# Patient Record
Sex: Female | Born: 1980 | Race: Black or African American | Hispanic: No | Marital: Single | Smoking: Current every day smoker
Health system: Southern US, Community
[De-identification: ages and names within clinical notes are randomized; demographics above are authoritative.]

## PROBLEM LIST (undated history)

## (undated) ENCOUNTER — Inpatient Hospital Stay: Payer: Self-pay

## (undated) DIAGNOSIS — Z86018 Personal history of other benign neoplasm: Secondary | ICD-10-CM

## (undated) DIAGNOSIS — F419 Anxiety disorder, unspecified: Secondary | ICD-10-CM

## (undated) DIAGNOSIS — Z9889 Other specified postprocedural states: Secondary | ICD-10-CM

## (undated) DIAGNOSIS — F32A Depression, unspecified: Secondary | ICD-10-CM

## (undated) DIAGNOSIS — F329 Major depressive disorder, single episode, unspecified: Secondary | ICD-10-CM

## (undated) DIAGNOSIS — D219 Benign neoplasm of connective and other soft tissue, unspecified: Secondary | ICD-10-CM

## (undated) DIAGNOSIS — O24419 Gestational diabetes mellitus in pregnancy, unspecified control: Secondary | ICD-10-CM

## (undated) HISTORY — PX: DILATION AND CURETTAGE OF UTERUS: SHX78

## (undated) HISTORY — DX: Personal history of other benign neoplasm: Z86.018

## (undated) HISTORY — DX: Depression, unspecified: F32.A

## (undated) HISTORY — DX: Benign neoplasm of connective and other soft tissue, unspecified: D21.9

## (undated) HISTORY — DX: Other specified postprocedural states: Z98.890

## (undated) HISTORY — DX: Major depressive disorder, single episode, unspecified: F32.9

## (undated) HISTORY — DX: Anxiety disorder, unspecified: F41.9

## (undated) HISTORY — DX: Gestational diabetes mellitus in pregnancy, unspecified control: O24.419

---

## 2004-10-02 ENCOUNTER — Emergency Department: Payer: Self-pay | Admitting: Emergency Medicine

## 2004-12-27 ENCOUNTER — Emergency Department: Payer: Self-pay | Admitting: Internal Medicine

## 2005-03-25 ENCOUNTER — Emergency Department: Payer: Self-pay | Admitting: Emergency Medicine

## 2005-03-26 ENCOUNTER — Emergency Department: Payer: Self-pay | Admitting: Internal Medicine

## 2005-07-18 ENCOUNTER — Emergency Department: Payer: Self-pay | Admitting: Emergency Medicine

## 2005-12-01 ENCOUNTER — Emergency Department: Payer: Self-pay | Admitting: Emergency Medicine

## 2006-11-27 ENCOUNTER — Emergency Department: Payer: Self-pay

## 2008-05-16 ENCOUNTER — Emergency Department: Payer: Self-pay | Admitting: Emergency Medicine

## 2008-10-02 ENCOUNTER — Emergency Department: Payer: Self-pay | Admitting: Emergency Medicine

## 2008-11-01 ENCOUNTER — Emergency Department: Payer: Self-pay | Admitting: Emergency Medicine

## 2009-09-19 ENCOUNTER — Emergency Department: Payer: Self-pay | Admitting: Emergency Medicine

## 2009-12-04 ENCOUNTER — Emergency Department: Payer: Self-pay | Admitting: Emergency Medicine

## 2010-04-30 ENCOUNTER — Emergency Department: Payer: Self-pay | Admitting: Emergency Medicine

## 2010-08-08 ENCOUNTER — Emergency Department: Payer: Self-pay | Admitting: Unknown Physician Specialty

## 2010-08-19 ENCOUNTER — Emergency Department: Payer: Self-pay | Admitting: Emergency Medicine

## 2010-08-27 ENCOUNTER — Emergency Department: Payer: Self-pay | Admitting: Emergency Medicine

## 2010-10-26 ENCOUNTER — Emergency Department: Payer: Self-pay | Admitting: Emergency Medicine

## 2012-02-08 ENCOUNTER — Emergency Department: Payer: Self-pay | Admitting: Emergency Medicine

## 2012-10-16 ENCOUNTER — Emergency Department: Payer: Self-pay | Admitting: Internal Medicine

## 2012-10-16 LAB — URINALYSIS, COMPLETE
Bilirubin,UR: NEGATIVE
Glucose,UR: NEGATIVE mg/dL (ref 0–75)
Nitrite: NEGATIVE
Ph: 5 (ref 4.5–8.0)
Protein: 30
RBC,UR: 314 /HPF (ref 0–5)
Specific Gravity: 1.029 (ref 1.003–1.030)
Squamous Epithelial: 5
WBC UR: 5 /HPF (ref 0–5)

## 2012-10-16 LAB — CBC
HCT: 40.8 % (ref 35.0–47.0)
HGB: 13.8 g/dL (ref 12.0–16.0)
MCH: 30.7 pg (ref 26.0–34.0)
MCHC: 33.9 g/dL (ref 32.0–36.0)
MCV: 91 fL (ref 80–100)
Platelet: 232 10*3/uL (ref 150–440)
RBC: 4.5 10*6/uL (ref 3.80–5.20)
RDW: 12.6 % (ref 11.5–14.5)
WBC: 7.3 10*3/uL (ref 3.6–11.0)

## 2012-10-16 LAB — HCG, QUANTITATIVE, PREGNANCY: Beta Hcg, Quant.: <1 m[IU]/mL — ABNORMAL LOW

## 2012-12-20 ENCOUNTER — Emergency Department: Payer: Self-pay | Admitting: Emergency Medicine

## 2012-12-25 ENCOUNTER — Emergency Department: Payer: Self-pay | Admitting: Emergency Medicine

## 2012-12-25 LAB — TROPONIN I: Troponin-I: <0.02 ng/mL

## 2012-12-25 LAB — BASIC METABOLIC PANEL
Anion Gap: 5 — ABNORMAL LOW (ref 7–16)
BUN: 17 mg/dL (ref 7–18)
Calcium, Total: 8.6 mg/dL (ref 8.5–10.1)
Chloride: 107 mmol/L (ref 98–107)
Co2: 25 mmol/L (ref 21–32)
Creatinine: 0.65 mg/dL (ref 0.60–1.30)
EGFR (African American): >60
EGFR (Non-African Amer.): >60
Glucose: 82 mg/dL (ref 65–99)
Osmolality: 274 (ref 275–301)
Potassium: 3.9 mmol/L (ref 3.5–5.1)
Sodium: 137 mmol/L (ref 136–145)

## 2012-12-25 LAB — CBC
HCT: 38.4 % (ref 35.0–47.0)
HGB: 13.1 g/dL (ref 12.0–16.0)
MCH: 31.1 pg (ref 26.0–34.0)
MCHC: 34.1 g/dL (ref 32.0–36.0)
MCV: 91 fL (ref 80–100)
Platelet: 184 10*3/uL (ref 150–440)
RBC: 4.21 10*6/uL (ref 3.80–5.20)
RDW: 12.7 % (ref 11.5–14.5)
WBC: 6.9 10*3/uL (ref 3.6–11.0)

## 2012-12-25 LAB — CK TOTAL AND CKMB (NOT AT ARMC)
CK, Total: 70 U/L (ref 21–215)
CK-MB: <0.5 ng/mL — ABNORMAL LOW (ref 0.5–3.6)

## 2013-04-12 ENCOUNTER — Emergency Department: Payer: Self-pay | Admitting: Emergency Medicine

## 2013-04-14 ENCOUNTER — Emergency Department: Payer: Self-pay | Admitting: Emergency Medicine

## 2013-04-17 ENCOUNTER — Emergency Department: Payer: Self-pay | Admitting: Emergency Medicine

## 2013-07-06 ENCOUNTER — Emergency Department: Payer: Self-pay | Admitting: Emergency Medicine

## 2013-07-06 LAB — URINALYSIS, COMPLETE
Bacteria: NONE SEEN
Bilirubin,UR: NEGATIVE
Blood: NEGATIVE
Glucose,UR: NEGATIVE mg/dL (ref 0–75)
Ketone: NEGATIVE
Leukocyte Esterase: NEGATIVE
Nitrite: NEGATIVE
Ph: 6 (ref 4.5–8.0)
Protein: NEGATIVE
RBC,UR: 1 /HPF (ref 0–5)
Specific Gravity: 1.021 (ref 1.003–1.030)
Squamous Epithelial: 4
WBC UR: 1 /HPF (ref 0–5)

## 2013-07-06 LAB — COMPREHENSIVE METABOLIC PANEL
Albumin: 3.6 g/dL (ref 3.4–5.0)
Alkaline Phosphatase: 83 U/L (ref 50–136)
Anion Gap: 3 — ABNORMAL LOW (ref 7–16)
BUN: 13 mg/dL (ref 7–18)
Bilirubin,Total: 0.9 mg/dL (ref 0.2–1.0)
Calcium, Total: 9.1 mg/dL (ref 8.5–10.1)
Chloride: 109 mmol/L — ABNORMAL HIGH (ref 98–107)
Co2: 28 mmol/L (ref 21–32)
Creatinine: 0.83 mg/dL (ref 0.60–1.30)
EGFR (African American): >60
EGFR (Non-African Amer.): >60
Glucose: 96 mg/dL (ref 65–99)
Osmolality: 279 (ref 275–301)
Potassium: 3.9 mmol/L (ref 3.5–5.1)
SGOT(AST): 24 U/L (ref 15–37)
SGPT (ALT): 21 U/L (ref 12–78)
Sodium: 140 mmol/L (ref 136–145)
Total Protein: 6.9 g/dL (ref 6.4–8.2)

## 2013-07-06 LAB — CBC
HCT: 39.2 % (ref 35.0–47.0)
HGB: 13.7 g/dL (ref 12.0–16.0)
MCH: 32 pg (ref 26.0–34.0)
MCHC: 35 g/dL (ref 32.0–36.0)
MCV: 91 fL (ref 80–100)
Platelet: 188 10*3/uL (ref 150–440)
RBC: 4.29 10*6/uL (ref 3.80–5.20)
RDW: 13 % (ref 11.5–14.5)
WBC: 8.4 10*3/uL (ref 3.6–11.0)

## 2013-07-06 LAB — LIPASE, BLOOD: Lipase: 150 U/L (ref 73–393)

## 2013-07-07 LAB — GC/CHLAMYDIA PROBE AMP

## 2013-07-07 LAB — WET PREP, GENITAL

## 2013-08-08 ENCOUNTER — Emergency Department: Payer: Self-pay | Admitting: Emergency Medicine

## 2013-08-08 LAB — COMPREHENSIVE METABOLIC PANEL
Albumin: 4.1 g/dL (ref 3.4–5.0)
Alkaline Phosphatase: 94 U/L (ref 50–136)
Anion Gap: 3 — ABNORMAL LOW (ref 7–16)
BUN: 12 mg/dL (ref 7–18)
Bilirubin,Total: 1 mg/dL (ref 0.2–1.0)
Calcium, Total: 10 mg/dL (ref 8.5–10.1)
Chloride: 103 mmol/L (ref 98–107)
Co2: 31 mmol/L (ref 21–32)
Creatinine: 0.86 mg/dL (ref 0.60–1.30)
EGFR (African American): >60
EGFR (Non-African Amer.): >60
Glucose: 93 mg/dL (ref 65–99)
Osmolality: 273 (ref 275–301)
Potassium: 3.6 mmol/L (ref 3.5–5.1)
SGOT(AST): 17 U/L (ref 15–37)
SGPT (ALT): 15 U/L (ref 12–78)
Sodium: 137 mmol/L (ref 136–145)
Total Protein: 7.9 g/dL (ref 6.4–8.2)

## 2013-08-08 LAB — CBC
HCT: 42.8 % (ref 35.0–47.0)
HGB: 14.6 g/dL (ref 12.0–16.0)
MCH: 30.7 pg (ref 26.0–34.0)
MCHC: 34 g/dL (ref 32.0–36.0)
MCV: 90 fL (ref 80–100)
Platelet: 219 10*3/uL (ref 150–440)
RBC: 4.75 10*6/uL (ref 3.80–5.20)
RDW: 12.6 % (ref 11.5–14.5)
WBC: 15.8 10*3/uL — ABNORMAL HIGH (ref 3.6–11.0)

## 2013-08-08 LAB — URINALYSIS, COMPLETE
Bacteria: NEGATIVE
Specific Gravity: 1.01 (ref 1.003–1.030)

## 2013-08-11 ENCOUNTER — Emergency Department: Payer: Self-pay | Admitting: Emergency Medicine

## 2013-08-11 LAB — URINALYSIS, COMPLETE
Bacteria: NONE SEEN
Bilirubin,UR: NEGATIVE
Blood: NEGATIVE
Glucose,UR: NEGATIVE mg/dL (ref 0–75)
Ketone: NEGATIVE
Leukocyte Esterase: NEGATIVE
Nitrite: NEGATIVE
Ph: 6 (ref 4.5–8.0)
Protein: NEGATIVE
RBC,UR: 1 /HPF (ref 0–5)
Specific Gravity: 1.013 (ref 1.003–1.030)
Squamous Epithelial: 12
WBC UR: <1 /HPF (ref 0–5)

## 2013-08-11 LAB — CBC
HCT: 40.5 % (ref 35.0–47.0)
HGB: 13.8 g/dL (ref 12.0–16.0)
MCH: 30.9 pg (ref 26.0–34.0)
MCHC: 34.1 g/dL (ref 32.0–36.0)
MCV: 91 fL (ref 80–100)
Platelet: 193 10*3/uL (ref 150–440)
RBC: 4.46 10*6/uL (ref 3.80–5.20)
RDW: 12.5 % (ref 11.5–14.5)
WBC: 10.7 10*3/uL (ref 3.6–11.0)

## 2013-08-11 LAB — COMPREHENSIVE METABOLIC PANEL
Albumin: 4 g/dL (ref 3.4–5.0)
Alkaline Phosphatase: 78 U/L
Anion Gap: 3 — ABNORMAL LOW (ref 7–16)
BUN: 15 mg/dL (ref 7–18)
Bilirubin,Total: 0.7 mg/dL (ref 0.2–1.0)
Calcium, Total: 9.7 mg/dL (ref 8.5–10.1)
Chloride: 105 mmol/L (ref 98–107)
Co2: 29 mmol/L (ref 21–32)
Creatinine: 0.8 mg/dL (ref 0.60–1.30)
EGFR (African American): >60
EGFR (Non-African Amer.): >60
Glucose: 88 mg/dL (ref 65–99)
Osmolality: 274 (ref 275–301)
Potassium: 3.5 mmol/L (ref 3.5–5.1)
SGOT(AST): 18 U/L (ref 15–37)
SGPT (ALT): 15 U/L (ref 12–78)
Sodium: 137 mmol/L (ref 136–145)
Total Protein: 7.6 g/dL (ref 6.4–8.2)

## 2013-08-11 LAB — URINE CULTURE

## 2013-09-21 HISTORY — PX: MYOMECTOMY: SHX85

## 2013-10-06 ENCOUNTER — Emergency Department: Payer: Self-pay | Admitting: Emergency Medicine

## 2013-10-09 ENCOUNTER — Emergency Department: Payer: Self-pay | Admitting: Emergency Medicine

## 2013-10-09 LAB — CBC WITH DIFFERENTIAL/PLATELET
Basophil #: 0.1 10*3/uL (ref 0.0–0.1)
Basophil %: 0.7 %
Eosinophil #: 0.3 10*3/uL (ref 0.0–0.7)
Eosinophil %: 2.2 %
HCT: 40.7 % (ref 35.0–47.0)
HGB: 13.6 g/dL (ref 12.0–16.0)
Lymphocyte #: 2.6 10*3/uL (ref 1.0–3.6)
Lymphocyte %: 22.7 %
MCH: 30.8 pg (ref 26.0–34.0)
MCHC: 33.3 g/dL (ref 32.0–36.0)
MCV: 92 fL (ref 80–100)
Monocyte #: 0.8 x10 3/mm (ref 0.2–0.9)
Monocyte %: 6.9 %
Neutrophil #: 7.8 10*3/uL — ABNORMAL HIGH (ref 1.4–6.5)
Neutrophil %: 67.5 %
Platelet: 202 10*3/uL (ref 150–440)
RBC: 4.41 10*6/uL (ref 3.80–5.20)
RDW: 13 % (ref 11.5–14.5)
WBC: 11.5 10*3/uL — ABNORMAL HIGH (ref 3.6–11.0)

## 2013-10-09 LAB — URINALYSIS, COMPLETE
Bacteria: NONE SEEN
Bilirubin,UR: NEGATIVE
Blood: NEGATIVE
Glucose,UR: NEGATIVE mg/dL (ref 0–75)
Hyaline Cast: 1
Ketone: NEGATIVE
Leukocyte Esterase: NEGATIVE
Nitrite: NEGATIVE
Ph: 5 (ref 4.5–8.0)
Protein: NEGATIVE
RBC,UR: <1 /HPF (ref 0–5)
Specific Gravity: 1.024 (ref 1.003–1.030)
Squamous Epithelial: 9
WBC UR: 1 /HPF (ref 0–5)

## 2013-10-09 LAB — COMPREHENSIVE METABOLIC PANEL
Albumin: 3.8 g/dL (ref 3.4–5.0)
Alkaline Phosphatase: 71 U/L
Anion Gap: 3 — ABNORMAL LOW (ref 7–16)
BUN: 12 mg/dL (ref 7–18)
Bilirubin,Total: 0.6 mg/dL (ref 0.2–1.0)
Calcium, Total: 9.4 mg/dL (ref 8.5–10.1)
Chloride: 107 mmol/L (ref 98–107)
Co2: 29 mmol/L (ref 21–32)
Creatinine: 0.66 mg/dL (ref 0.60–1.30)
EGFR (African American): >60
EGFR (Non-African Amer.): >60
Glucose: 89 mg/dL (ref 65–99)
Osmolality: 277 (ref 275–301)
Potassium: 3.6 mmol/L (ref 3.5–5.1)
SGOT(AST): 17 U/L (ref 15–37)
SGPT (ALT): 16 U/L (ref 12–78)
Sodium: 139 mmol/L (ref 136–145)
Total Protein: 7.3 g/dL (ref 6.4–8.2)

## 2013-10-30 ENCOUNTER — Emergency Department: Payer: Self-pay | Admitting: Emergency Medicine

## 2013-11-19 DIAGNOSIS — Z86018 Personal history of other benign neoplasm: Secondary | ICD-10-CM

## 2013-11-19 HISTORY — DX: Personal history of other benign neoplasm: Z86.018

## 2013-11-22 DIAGNOSIS — N979 Female infertility, unspecified: Secondary | ICD-10-CM | POA: Insufficient documentation

## 2014-02-08 ENCOUNTER — Emergency Department: Payer: Self-pay | Admitting: Emergency Medicine

## 2014-02-09 ENCOUNTER — Emergency Department: Payer: Self-pay | Admitting: Emergency Medicine

## 2014-02-09 LAB — CBC WITH DIFFERENTIAL/PLATELET
Basophil #: 0 10*3/uL (ref 0.0–0.1)
Basophil %: 0.5 %
Eosinophil #: 0.1 10*3/uL (ref 0.0–0.7)
Eosinophil %: 1.8 %
HCT: 44 % (ref 35.0–47.0)
HGB: 14.8 g/dL (ref 12.0–16.0)
Lymphocyte #: 1.9 10*3/uL (ref 1.0–3.6)
Lymphocyte %: 30.1 %
MCH: 31.3 pg (ref 26.0–34.0)
MCHC: 33.6 g/dL (ref 32.0–36.0)
MCV: 93 fL (ref 80–100)
Monocyte #: 0.4 x10 3/mm (ref 0.2–0.9)
Monocyte %: 6.3 %
Neutrophil #: 3.9 10*3/uL (ref 1.4–6.5)
Neutrophil %: 61.3 %
Platelet: 190 10*3/uL (ref 150–440)
RBC: 4.72 10*6/uL (ref 3.80–5.20)
RDW: 13.6 % (ref 11.5–14.5)
WBC: 6.3 10*3/uL (ref 3.6–11.0)

## 2014-02-09 LAB — BASIC METABOLIC PANEL
Anion Gap: 3 — ABNORMAL LOW (ref 7–16)
BUN: 9 mg/dL (ref 7–18)
Calcium, Total: 9.2 mg/dL (ref 8.5–10.1)
Chloride: 105 mmol/L (ref 98–107)
Co2: 29 mmol/L (ref 21–32)
Creatinine: 0.77 mg/dL (ref 0.60–1.30)
EGFR (African American): >60
EGFR (Non-African Amer.): >60
Glucose: 103 mg/dL — ABNORMAL HIGH (ref 65–99)
Osmolality: 273 (ref 275–301)
Potassium: 3.8 mmol/L (ref 3.5–5.1)
Sodium: 137 mmol/L (ref 136–145)

## 2014-02-09 LAB — URINALYSIS, COMPLETE
Bacteria: NONE SEEN
Bilirubin,UR: NEGATIVE
Blood: NEGATIVE
Glucose,UR: NEGATIVE mg/dL (ref 0–75)
Ketone: NEGATIVE
Leukocyte Esterase: NEGATIVE
Nitrite: NEGATIVE
Ph: 6 (ref 4.5–8.0)
Protein: NEGATIVE
RBC,UR: NONE SEEN /HPF (ref 0–5)
Specific Gravity: 1.019 (ref 1.003–1.030)
Squamous Epithelial: 5
WBC UR: <1 /HPF (ref 0–5)

## 2014-03-21 ENCOUNTER — Emergency Department: Payer: Self-pay | Admitting: Emergency Medicine

## 2014-05-29 ENCOUNTER — Emergency Department: Payer: Self-pay | Admitting: Student

## 2014-05-29 LAB — COMPREHENSIVE METABOLIC PANEL
Albumin: 3.5 g/dL (ref 3.4–5.0)
Alkaline Phosphatase: 75 U/L
Anion Gap: 5 — ABNORMAL LOW (ref 7–16)
BUN: 14 mg/dL (ref 7–18)
Bilirubin,Total: 0.4 mg/dL (ref 0.2–1.0)
Calcium, Total: 8.7 mg/dL (ref 8.5–10.1)
Chloride: 108 mmol/L — ABNORMAL HIGH (ref 98–107)
Co2: 27 mmol/L (ref 21–32)
Creatinine: 0.82 mg/dL (ref 0.60–1.30)
EGFR (African American): >60
EGFR (Non-African Amer.): >60
Glucose: 96 mg/dL (ref 65–99)
Osmolality: 280 (ref 275–301)
Potassium: 3.9 mmol/L (ref 3.5–5.1)
SGOT(AST): 20 U/L (ref 15–37)
SGPT (ALT): 13 U/L — ABNORMAL LOW
Sodium: 140 mmol/L (ref 136–145)
Total Protein: 6.9 g/dL (ref 6.4–8.2)

## 2014-05-29 LAB — URINALYSIS, COMPLETE
Bacteria: NONE SEEN
Bilirubin,UR: NEGATIVE
Glucose,UR: NEGATIVE mg/dL (ref 0–75)
Ketone: NEGATIVE
Leukocyte Esterase: NEGATIVE
Nitrite: NEGATIVE
Ph: 5 (ref 4.5–8.0)
Protein: NEGATIVE
RBC,UR: 1 /HPF (ref 0–5)
Specific Gravity: 1.02 (ref 1.003–1.030)
Squamous Epithelial: 1
WBC UR: <1 /HPF (ref 0–5)

## 2014-05-29 LAB — CBC
HCT: 40.7 % (ref 35.0–47.0)
HGB: 13.2 g/dL (ref 12.0–16.0)
MCH: 30.3 pg (ref 26.0–34.0)
MCHC: 32.4 g/dL (ref 32.0–36.0)
MCV: 93 fL (ref 80–100)
Platelet: 183 10*3/uL (ref 150–440)
RBC: 4.36 10*6/uL (ref 3.80–5.20)
RDW: 12.5 % (ref 11.5–14.5)
WBC: 6.4 10*3/uL (ref 3.6–11.0)

## 2014-05-29 LAB — LIPASE, BLOOD: Lipase: 137 U/L (ref 73–393)

## 2014-05-29 LAB — HCG, QUANTITATIVE, PREGNANCY: Beta Hcg, Quant.: <1 m[IU]/mL — ABNORMAL LOW

## 2014-08-15 ENCOUNTER — Emergency Department: Payer: Self-pay | Admitting: Emergency Medicine

## 2014-08-15 LAB — COMPREHENSIVE METABOLIC PANEL
Albumin: 4 g/dL (ref 3.4–5.0)
Alkaline Phosphatase: 67 U/L
Anion Gap: 2 — ABNORMAL LOW (ref 7–16)
BUN: 10 mg/dL (ref 7–18)
Bilirubin,Total: 1 mg/dL (ref 0.2–1.0)
Calcium, Total: 8.6 mg/dL (ref 8.5–10.1)
Chloride: 111 mmol/L — ABNORMAL HIGH (ref 98–107)
Co2: 28 mmol/L (ref 21–32)
Creatinine: 0.74 mg/dL (ref 0.60–1.30)
EGFR (African American): >60
EGFR (Non-African Amer.): >60
Glucose: 94 mg/dL (ref 65–99)
Osmolality: 280 (ref 275–301)
Potassium: 3.7 mmol/L (ref 3.5–5.1)
SGOT(AST): 10 U/L — ABNORMAL LOW (ref 15–37)
SGPT (ALT): 15 U/L
Sodium: 141 mmol/L (ref 136–145)
Total Protein: 7.8 g/dL (ref 6.4–8.2)

## 2014-08-15 LAB — URINALYSIS, COMPLETE
Bacteria: NONE SEEN
Bilirubin,UR: NEGATIVE
Blood: NEGATIVE
Glucose,UR: NEGATIVE mg/dL (ref 0–75)
Hyaline Cast: 14
Ketone: NEGATIVE
Leukocyte Esterase: NEGATIVE
Nitrite: NEGATIVE
Ph: 5 (ref 4.5–8.0)
Protein: 30
RBC,UR: 3 /HPF (ref 0–5)
Specific Gravity: 1.027 (ref 1.003–1.030)
Squamous Epithelial: 15
WBC UR: 3 /HPF (ref 0–5)

## 2014-08-15 LAB — CBC WITH DIFFERENTIAL/PLATELET
Basophil #: 0 10*3/uL (ref 0.0–0.1)
Basophil %: 0.5 %
Eosinophil #: 0.2 10*3/uL (ref 0.0–0.7)
Eosinophil %: 2.8 %
HCT: 43 % (ref 35.0–47.0)
HGB: 14.1 g/dL (ref 12.0–16.0)
Lymphocyte #: 2.8 10*3/uL (ref 1.0–3.6)
Lymphocyte %: 36 %
MCH: 30.4 pg (ref 26.0–34.0)
MCHC: 32.7 g/dL (ref 32.0–36.0)
MCV: 93 fL (ref 80–100)
Monocyte #: 0.6 x10 3/mm (ref 0.2–0.9)
Monocyte %: 7.9 %
Neutrophil #: 4.1 10*3/uL (ref 1.4–6.5)
Neutrophil %: 52.8 %
Platelet: 174 10*3/uL (ref 150–440)
RBC: 4.62 10*6/uL (ref 3.80–5.20)
RDW: 13.1 % (ref 11.5–14.5)
WBC: 7.7 10*3/uL (ref 3.6–11.0)

## 2015-01-30 ENCOUNTER — Emergency Department
Admission: EM | Admit: 2015-01-30 | Discharge: 2015-01-30 | Disposition: A | Discharge status: Home / Self Care | Payer: Self-pay | Attending: Emergency Medicine | Admitting: Emergency Medicine

## 2015-01-30 ENCOUNTER — Other Ambulatory Visit: Payer: Self-pay

## 2015-01-30 DIAGNOSIS — R42 Dizziness and giddiness: Secondary | ICD-10-CM | POA: Insufficient documentation

## 2015-01-30 DIAGNOSIS — Z720 Tobacco use: Secondary | ICD-10-CM | POA: Insufficient documentation

## 2015-01-30 DIAGNOSIS — R030 Elevated blood-pressure reading, without diagnosis of hypertension: Secondary | ICD-10-CM | POA: Insufficient documentation

## 2015-01-30 DIAGNOSIS — Z331 Pregnant state, incidental: Secondary | ICD-10-CM | POA: Insufficient documentation

## 2015-01-30 LAB — URINALYSIS COMPLETE WITH MICROSCOPIC (ARMC ONLY)
Bilirubin Urine: NEGATIVE
Glucose, UA: NEGATIVE mg/dL
Hgb urine dipstick: NEGATIVE
Leukocytes, UA: NEGATIVE
Nitrite: NEGATIVE
Protein, ur: 30 mg/dL — AB
RBC / HPF: NONE SEEN RBC/hpf (ref 0–5)
Specific Gravity, Urine: 1.031 — ABNORMAL HIGH (ref 1.005–1.030)
WBC, UA: NONE SEEN WBC/hpf (ref 0–5)
pH: 5 (ref 5.0–8.0)

## 2015-01-30 LAB — BASIC METABOLIC PANEL
Anion gap: 8 (ref 5–15)
BUN: 14 mg/dL (ref 6–20)
CO2: 27 mmol/L (ref 22–32)
Calcium: 9.3 mg/dL (ref 8.9–10.3)
Chloride: 103 mmol/L (ref 101–111)
Creatinine, Ser: 0.7 mg/dL (ref 0.44–1.00)
GFR calc Af Amer: >60 mL/min (ref >60)
GFR calc non Af Amer: >60 mL/min (ref >60)
Glucose, Bld: 158 mg/dL — ABNORMAL HIGH (ref 65–99)
Potassium: 2.6 mmol/L — CL (ref 3.5–5.1)
Sodium: 138 mmol/L (ref 135–145)

## 2015-01-30 LAB — CBC
HCT: 38.7 % (ref 35.0–47.0)
Hemoglobin: 12.8 g/dL (ref 12.0–16.0)
MCH: 30 pg (ref 26.0–34.0)
MCHC: 33.1 g/dL (ref 32.0–36.0)
MCV: 90.7 fL (ref 80.0–100.0)
Platelets: 174 10*3/uL (ref 150–440)
RBC: 4.27 MIL/uL (ref 3.80–5.20)
RDW: 12.7 % (ref 11.5–14.5)
WBC: 5.9 10*3/uL (ref 3.6–11.0)

## 2015-01-30 LAB — HCG, QUANTITATIVE, PREGNANCY: hCG, Beta Chain, Quant, S: <1 m[IU]/mL (ref <5)

## 2015-01-30 LAB — PREGNANCY, URINE: Preg Test, Ur: POSITIVE — AB

## 2015-01-30 MED ORDER — POTASSIUM CHLORIDE CRYS ER 20 MEQ PO TBCR
40.0000 meq | EXTENDED_RELEASE_TABLET | Freq: Once | ORAL | Status: AC
  Administered 2015-01-30: 40 meq via ORAL

## 2015-01-30 MED ORDER — POTASSIUM CHLORIDE CRYS ER 20 MEQ PO TBCR
EXTENDED_RELEASE_TABLET | ORAL | Status: AC
  Filled 2015-01-30: qty 2

## 2015-01-30 NOTE — Discharge Instructions (Signed)
Please eat more fruits and vegetables to raise your potassium. The low sodium V8 also has a lot of potassium in it.   Please follow up with your doctor this week to repeat the blood pressure reading again. If it remails elevated your doctor will likely start you on an antihypertensive medicine. (Your blood pressure was normal here after the first two readings)

## 2015-01-30 NOTE — ED Notes (Signed)
Pt reports this morning took her bp and it was high. Pt reports feeling of palpitations.  Denies chest pain.  Complains of some dizziness.
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
## 2015-01-31 LAB — MISC LABCORP TEST (SEND OUT): Labcorp test code: 480038

## 2015-03-23 ENCOUNTER — Emergency Department
Admission: EM | Admit: 2015-03-23 | Discharge: 2015-03-23 | Disposition: A | Discharge status: Home / Self Care | Payer: Self-pay | Attending: Emergency Medicine | Admitting: Emergency Medicine

## 2015-03-23 DIAGNOSIS — Y929 Unspecified place or not applicable: Secondary | ICD-10-CM | POA: Insufficient documentation

## 2015-03-23 DIAGNOSIS — Y998 Other external cause status: Secondary | ICD-10-CM | POA: Insufficient documentation

## 2015-03-23 DIAGNOSIS — Z720 Tobacco use: Secondary | ICD-10-CM | POA: Insufficient documentation

## 2015-03-23 DIAGNOSIS — Z791 Long term (current) use of non-steroidal anti-inflammatories (NSAID): Secondary | ICD-10-CM | POA: Insufficient documentation

## 2015-03-23 DIAGNOSIS — Z79899 Other long term (current) drug therapy: Secondary | ICD-10-CM | POA: Insufficient documentation

## 2015-03-23 DIAGNOSIS — S39012A Strain of muscle, fascia and tendon of lower back, initial encounter: Secondary | ICD-10-CM | POA: Insufficient documentation

## 2015-03-23 DIAGNOSIS — Y939 Activity, unspecified: Secondary | ICD-10-CM | POA: Insufficient documentation

## 2015-03-23 DIAGNOSIS — Z3202 Encounter for pregnancy test, result negative: Secondary | ICD-10-CM | POA: Insufficient documentation

## 2015-03-23 DIAGNOSIS — X58XXXA Exposure to other specified factors, initial encounter: Secondary | ICD-10-CM | POA: Insufficient documentation

## 2015-03-23 LAB — POCT PREGNANCY, URINE: Preg Test, Ur: NEGATIVE

## 2015-03-23 MED ORDER — NAPROXEN 500 MG PO TABS: 500.0000 mg | ORAL_TABLET | Freq: Two times a day (BID) | ORAL | Status: DC

## 2015-03-23 MED ORDER — METHOCARBAMOL 500 MG PO TABS: 500.0000 mg | ORAL_TABLET | Freq: Three times a day (TID) | ORAL | Status: DC
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
## 2015-03-23 NOTE — Discharge Instructions (Signed)
FOLLOW UP WITH YOUR DOCTOR IF ANY CONTINUED PROBLEMS  ICE OR HEAT TO MUSCLES AS NEEDED TAKE MEDICATION AS DIRECTED

## 2015-03-23 NOTE — ED Notes (Signed)
Pt c/o back pain since yesterday. No increased urination, denies fevers or chills.

## 2015-07-05 ENCOUNTER — Emergency Department
Admission: EM | Admit: 2015-07-05 | Discharge: 2015-07-05 | Discharge status: Against Medical Advice | Payer: Self-pay | Attending: Emergency Medicine | Admitting: Emergency Medicine

## 2015-07-05 ENCOUNTER — Emergency Department: Payer: Self-pay

## 2015-07-05 DIAGNOSIS — Z046 Encounter for general psychiatric examination, requested by authority: Secondary | ICD-10-CM | POA: Insufficient documentation

## 2015-07-05 DIAGNOSIS — Y9289 Other specified places as the place of occurrence of the external cause: Secondary | ICD-10-CM | POA: Insufficient documentation

## 2015-07-05 DIAGNOSIS — S00511A Abrasion of lip, initial encounter: Secondary | ICD-10-CM | POA: Insufficient documentation

## 2015-07-05 DIAGNOSIS — Y9389 Activity, other specified: Secondary | ICD-10-CM | POA: Insufficient documentation

## 2015-07-05 DIAGNOSIS — Z720 Tobacco use: Secondary | ICD-10-CM | POA: Insufficient documentation

## 2015-07-05 DIAGNOSIS — Y998 Other external cause status: Secondary | ICD-10-CM | POA: Insufficient documentation

## 2015-07-05 LAB — POCT PREGNANCY, URINE: Preg Test, Ur: NEGATIVE
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
## 2015-07-05 NOTE — ED Notes (Signed)
POC Urine pregnancy negative.
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
## 2015-07-10 ENCOUNTER — Emergency Department
Admission: EM | Admit: 2015-07-10 | Discharge: 2015-07-10 | Disposition: A | Discharge status: Home / Self Care | Payer: Self-pay | Attending: Emergency Medicine | Admitting: Emergency Medicine

## 2015-07-10 ENCOUNTER — Encounter: Payer: Self-pay | Admitting: Emergency Medicine

## 2015-07-10 DIAGNOSIS — H1132 Conjunctival hemorrhage, left eye: Secondary | ICD-10-CM | POA: Insufficient documentation

## 2015-07-10 DIAGNOSIS — Z720 Tobacco use: Secondary | ICD-10-CM | POA: Insufficient documentation

## 2015-07-10 DIAGNOSIS — S0083XD Contusion of other part of head, subsequent encounter: Secondary | ICD-10-CM

## 2015-07-10 DIAGNOSIS — S0512XD Contusion of eyeball and orbital tissues, left eye, subsequent encounter: Secondary | ICD-10-CM | POA: Insufficient documentation

## 2015-07-10 DIAGNOSIS — Z79899 Other long term (current) drug therapy: Secondary | ICD-10-CM | POA: Insufficient documentation

## 2015-07-10 MED ORDER — ONDANSETRON 4 MG PO TBDP
ORAL_TABLET | ORAL | Status: AC
  Filled 2015-07-10: qty 1

## 2015-07-10 MED ORDER — NAPROXEN 500 MG PO TABS: 500.0000 mg | ORAL_TABLET | Freq: Two times a day (BID) | ORAL | Status: DC
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
## 2015-07-10 NOTE — Discharge Instructions (Signed)
Subconjunctival Hemorrhage °Subconjunctival hemorrhage is bleeding that happens between the white part of your eye (sclera) and the clear membrane that covers the outside of your eye (conjunctiva). There are many tiny blood vessels near the surface of your eye. A subconjunctival hemorrhage happens when one or more of these vessels breaks and bleeds, causing a red patch to appear on your eye. This is similar to a bruise. °Depending on the amount of bleeding, the red patch may only cover a small area of your eye or it may cover the entire visible part of the sclera. If a lot of blood collects under the conjunctiva, there may also be swelling. Subconjunctival hemorrhages do not affect your vision or cause pain, but your eye may feel irritated if there is swelling. Subconjunctival hemorrhages usually do not require treatment, and they disappear on their own within two weeks. °CAUSES °This condition may be caused by: °· Mild trauma, such as rubbing your eye too hard. °· Severe trauma or blunt injuries. °· Coughing, sneezing, or vomiting. °· Straining, such as when lifting a heavy object. °· High blood pressure. °· Recent eye surgery. °· A history of diabetes. °· Certain medicines, especially blood thinners (anticoagulants). °· Other conditions, such as eye tumors, bleeding disorders, or blood vessel abnormalities. °Subconjunctival hemorrhages can happen without an obvious cause.  °SYMPTOMS  °Symptoms of this condition include: °· A bright red or dark red patch on the white part of the eye. °¨ The red area may spread out to cover a larger area of the eye before it goes away. °¨ The red area may turn brownish-yellow before it goes away. °· Swelling. °· Mild eye irritation. °DIAGNOSIS °This condition is diagnosed with a physical exam. If your subconjunctival hemorrhage was caused by trauma, your health care provider may refer you to an eye specialist (ophthalmologist) or another specialist to check for other injuries. You  may have other tests, including: °· An eye exam. °· A blood pressure check. °· Blood tests to check for bleeding disorders. °If your subconjunctival hemorrhage was caused by trauma, X-rays or a CT scan may be done to check for other injuries. °TREATMENT °Usually, no treatment is needed. Your health care provider may recommend eye drops or cold compresses to help with discomfort. °HOME CARE INSTRUCTIONS °· Take over-the-counter and prescription medicines only as directed by your health care provider. °· Use eye drops or cold compresses to help with discomfort as directed by your health care provider. °· Avoid activities, things, and environments that may irritate or injure your eye. °· Keep all follow-up visits as told by your health care provider. This is important. °SEEK MEDICAL CARE IF: °· You have pain in your eye. °· The bleeding does not go away within 3 weeks. °· You keep getting new subconjunctival hemorrhages. °SEEK IMMEDIATE MEDICAL CARE IF: °· Your vision changes or you have difficulty seeing. °· You suddenly develop severe sensitivity to light. °· You develop a severe headache, persistent vomiting, confusion, or abnormal tiredness (lethargy). °· Your eye seems to bulge or protrude from your eye socket. °· You develop unexplained bruises on your body. °· You have unexplained bleeding in another area of your body. °  °This information is not intended to replace advice given to you by your health care provider. Make sure you discuss any questions you have with your health care provider. °  °Document Released: 09/07/2005 Document Revised: 05/29/2015 Document Reviewed: 11/14/2014 °Elsevier Interactive Patient Education ©2016 [employer] ° °
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
## 2015-09-01 ENCOUNTER — Encounter: Payer: Self-pay | Admitting: Emergency Medicine

## 2015-09-01 ENCOUNTER — Emergency Department: Payer: Self-pay

## 2015-09-01 ENCOUNTER — Emergency Department
Admission: EM | Admit: 2015-09-01 | Discharge: 2015-09-01 | Disposition: A | Discharge status: Home / Self Care | Payer: Self-pay | Attending: Emergency Medicine | Admitting: Emergency Medicine

## 2015-09-01 DIAGNOSIS — Z3A15 15 weeks gestation of pregnancy: Secondary | ICD-10-CM | POA: Insufficient documentation

## 2015-09-01 DIAGNOSIS — F17210 Nicotine dependence, cigarettes, uncomplicated: Secondary | ICD-10-CM | POA: Insufficient documentation

## 2015-09-01 DIAGNOSIS — Z79899 Other long term (current) drug therapy: Secondary | ICD-10-CM | POA: Insufficient documentation

## 2015-09-01 DIAGNOSIS — O99512 Diseases of the respiratory system complicating pregnancy, second trimester: Secondary | ICD-10-CM | POA: Insufficient documentation

## 2015-09-01 DIAGNOSIS — O99332 Smoking (tobacco) complicating pregnancy, second trimester: Secondary | ICD-10-CM | POA: Insufficient documentation

## 2015-09-01 DIAGNOSIS — J069 Acute upper respiratory infection, unspecified: Secondary | ICD-10-CM | POA: Insufficient documentation

## 2015-09-01 MED ORDER — GUAIFENESIN-CODEINE 100-10 MG/5ML PO SOLN: 10.0000 mL | ORAL | Status: DC | PRN

## 2015-09-01 MED ORDER — AZITHROMYCIN 250 MG PO TABS: ORAL_TABLET | ORAL | Status: DC

## 2015-09-01 NOTE — ED Provider Notes (Signed)
[facility] Emergency Department Provider Note  ____________________________________________  Time seen: Approximately 2:32 PM  I have reviewed the triage vital signs and the nursing notes.   HISTORY  Chief Complaint Cough   HPI Vanessa Franco is a 34 y.o. female who presents for complaint of cough congestion 2 weeks. Denies any chest pain shortness of breath no runny nose or drainage. Denies any fever chills nausea vomiting. She is presently 15 weeks IUP.   History reviewed. No pertinent past medical history.  There are no active problems to display for this patient.   Past Surgical History  Procedure Laterality Date  . Myomectomy      Current Outpatient Rx  Name  Route  Sig  Dispense  Refill  . azithromycin (ZITHROMAX Z-PAK) 250 MG tablet      Take 2 tablets (500 mg) on  Day 1,  followed by 1 tablet (250 mg) once daily on Days 2 through 5.   6 each   0   . guaiFENesin-codeine 100-10 MG/5ML syrup   Oral   Take 10 mLs by mouth every 4 (four) hours as needed for cough.   180 mL   0   . Prenatal Vit-Fe Fumarate-FA (PRENATAL MULTIVITAMIN) TABS tablet   Oral   Take 1 tablet by mouth daily at 12 noon.           Allergies Toradol and Sulfa antibiotics  No family history on file.  Social History Social History  Substance Use Topics  . Smoking status: Current Every Day Smoker -- 1.00 packs/day    Types: Cigarettes  . Smokeless tobacco: None  . Alcohol Use: Yes     Comment: occ    Review of Systems Constitutional: No fever/chills Eyes: No visual changes. ENT: No sore throat. Cardiovascular: Denies chest pain. Respiratory: Denies shortness of breath. Gastrointestinal: No abdominal pain.  No nausea, no vomiting.  No diarrhea.  No constipation. Genitourinary: Negative for dysuria. Musculoskeletal: Negative for back pain. Skin: Negative for rash. Neurological: Negative for headaches, focal weakness or numbness.  10-point ROS  otherwise negative.  ____________________________________________   PHYSICAL EXAM:  VITAL SIGNS: ED Triage Vitals  Enc Vitals Group     BP 09/01/15 1407 121/87 mmHg     Pulse Rate 09/01/15 1407 96     Resp 09/01/15 1407 18     Temp 09/01/15 1407 99.1 F (37.3 C)     Temp Source 09/01/15 1407 Oral     SpO2 09/01/15 1407 100 %     Weight 09/01/15 1407 161 lb (73.029 kg)     Height 09/01/15 1407 5\' 7"  (1.702 m)     Head Cir --      Peak Flow --      Pain Score 09/01/15 1408 4     Pain Loc --      Pain Edu? --      Excl. in [place]? --     Constitutional: Alert and oriented. Well appearing and in no acute distress. Eyes: Conjunctivae are normal. PERRL. EOMI. Head: Atraumatic. Nose: No congestion/rhinnorhea. Mouth/Throat: Mucous membranes are moist.  Oropharynx non-erythematous. Neck: No stridor.   Cardiovascular: Normal rate, regular rhythm. Grossly normal heart sounds.  Good peripheral circulation. Respiratory: Normal respiratory effort.  No retractions. Lungs CTAB. Musculoskeletal: No lower extremity tenderness nor edema.  No joint effusions. Neurologic:  Normal speech and language. No gross focal neurologic deficits are appreciated. No gait instability. Skin:  Skin is warm, dry and intact. No rash noted. Psychiatric:  Mood and affect are normal. Speech and behavior are normal.  ____________________________________________   LABS (all labs ordered are listed, but only abnormal results are displayed)  Labs Reviewed - No data to display ____________________________________________   RADIOLOGY  Deferred secondary to pregnancy. Was treated for URI. ____________________________________________   PROCEDURES  Procedure(s) performed: None  Critical Care performed: No  ____________________________________________   INITIAL IMPRESSION / ASSESSMENT AND PLAN / ED COURSE  Pertinent labs & imaging results that were available during my care of the patient were reviewed by  me and considered in my medical decision making (see chart for details).  Acute URI. Rx given for Zithromax and guaifenesin with codeine. Patient follow-up with PCP or return the ER with any worsening symptomology. ____________________________________________   FINAL CLINICAL IMPRESSION(S) / ED DIAGNOSES  Final diagnoses:  Acute URI      Arlyss Repress, PA-C 09/01/15 1555  Nance Pear, MD 09/01/15 [phone]

## 2015-09-01 NOTE — ED Notes (Signed)
Discussed discharge instructions, prescriptions, and follow-up care with patient. No questions or concerns at this time. Pt stable at discharge.  

## 2015-09-01 NOTE — ED Notes (Signed)
C/o productive cough and congestion x 2 weeks

## 2015-09-01 NOTE — Discharge Instructions (Signed)
Upper Respiratory Infection, Adult Most upper respiratory infections (URIs) are a viral infection of the air passages leading to the lungs. A URI affects the nose, throat, and upper air passages. The most common type of URI is nasopharyngitis and is typically referred to as "the common cold." URIs run their course and usually go away on their own. Most of the time, a URI does not require medical attention, but sometimes a bacterial infection in the upper airways can follow a viral infection. This is called a secondary infection. Sinus and middle ear infections are common types of secondary upper respiratory infections. Bacterial pneumonia can also complicate a URI. A URI can worsen asthma and chronic obstructive pulmonary disease (COPD). Sometimes, these complications can require emergency medical care and may be life threatening.  CAUSES Almost all URIs are caused by viruses. A virus is a type of germ and can spread from one person to another.  RISKS FACTORS You may be at risk for a URI if:   You smoke.   You have chronic heart or lung disease.  You have a weakened defense (immune) system.   You are very young or very old.   You have nasal allergies or asthma.  You work in crowded or poorly ventilated areas.  You work in health care facilities or schools. SIGNS AND SYMPTOMS  Symptoms typically develop 2-3 days after you come in contact with a cold virus. Most viral URIs last 7-10 days. However, viral URIs from the influenza virus (flu virus) can last 14-18 days and are typically more severe. Symptoms may include:   Runny or stuffy (congested) nose.   Sneezing.   Cough.   Sore throat.   Headache.   Fatigue.   Fever.   Loss of appetite.   Pain in your forehead, behind your eyes, and over your cheekbones (sinus pain).  Muscle aches.  DIAGNOSIS  Your health care provider may diagnose a URI by:  Physical exam.  Tests to check that your symptoms are not due to  another condition such as:  Strep throat.  Sinusitis.  Pneumonia.  Asthma. TREATMENT  A URI goes away on its own with time. It cannot be cured with medicines, but medicines may be prescribed or recommended to relieve symptoms. Medicines may help:  Reduce your fever.  Reduce your cough.  Relieve nasal congestion. HOME CARE INSTRUCTIONS   Take medicines only as directed by your health care provider.   Gargle warm saltwater or take cough drops to comfort your throat as directed by your health care provider.  Use a warm mist humidifier or inhale steam from a shower to increase air moisture. This may make it easier to breathe.  Drink enough fluid to keep your urine clear or pale yellow.   Eat soups and other clear broths and maintain good nutrition.   Rest as needed.   Return to work when your temperature has returned to normal or as your health care provider advises. You may need to stay home longer to avoid infecting others. You can also use a face mask and careful hand washing to prevent spread of the virus.  Increase the usage of your inhaler if you have asthma.   Do not use any tobacco products, including cigarettes, chewing tobacco, or electronic cigarettes. If you need help quitting, ask your health care provider. PREVENTION  The best way to protect yourself from getting a cold is to practice good hygiene.   Avoid oral or hand contact with people with cold   symptoms.   Wash your hands often if contact occurs.  There is no clear evidence that vitamin C, vitamin E, echinacea, or exercise reduces the chance of developing a cold. However, it is always recommended to get plenty of rest, exercise, and practice good nutrition.  SEEK MEDICAL CARE IF:   You are getting worse rather than better.   Your symptoms are not controlled by medicine.   You have chills.  You have worsening shortness of breath.  You have brown or red mucus.  You have yellow or brown nasal  discharge.  You have pain in your face, especially when you bend forward.  You have a fever.  You have swollen neck glands.  You have pain while swallowing.  You have white areas in the back of your throat. SEEK IMMEDIATE MEDICAL CARE IF:   You have severe or persistent:  Headache.  Ear pain.  Sinus pain.  Chest pain.  You have chronic lung disease and any of the following:  Wheezing.  Prolonged cough.  Coughing up blood.  A change in your usual mucus.  You have a stiff neck.  You have changes in your:  Vision.  Hearing.  Thinking.  Mood. MAKE SURE YOU:   Understand these instructions.  Will watch your condition.  Will get help right away if you are not doing well or get worse.   This information is not intended to replace advice given to you by your health care provider. Make sure you discuss any questions you have with your health care provider.   Document Released: 03/03/2001 Document Revised: 01/22/2015 Document Reviewed: 12/13/2013 Elsevier Interactive Patient Education 2016 Elsevier Inc[employer]  

## 2015-09-22 NOTE — L&D Delivery Note (Signed)
Delivery Note At 1:15 PM a viable female was delivered via Vaginal, Spontaneous Delivery (Presentation: LOA).  APGAR: 8, 9; weight pending as baby is skin-to-skin with mom .   Placenta status: delivered spontaneously, intact.  Cord: 3VC with the following complications: None.  Cord pH: n/a  Anesthesia:  epidural Episiotomy: None Lacerations: 1st degree;Vaginal;Sulcus Suture Repair: 3.0 vicryl Est. Blood Loss (mL): 350  Mom to postpartum.  Baby to Couplet care / Skin to Skin.  Called to see patient.  Mom pushed to deliver a viable female infant.  The head followed by shoulders, which delivered without difficulty, and the rest of the body.  No nuchal cord noted.  Baby to mom's chest.  Cord clamped and cut after > 1 min delay.  Cord blood obtained.  Placenta delivered spontaneously, intact, with a 3-vessel cord.  First degree perineal vaginal left sulcal laceration repaired with 3-0 Vicryl in standard fashion.  All counts correct.  Hemostasis obtained with IV pitocin and fundal massage. EBL 350 mL.     Will Bonnet, MD 08/16/2016, 1:32 PM

## 2015-12-02 ENCOUNTER — Emergency Department
Admission: EM | Admit: 2015-12-02 | Discharge: 2015-12-02 | Disposition: A | Discharge status: Home / Self Care | Payer: Self-pay | Attending: Emergency Medicine | Admitting: Emergency Medicine

## 2015-12-02 ENCOUNTER — Encounter: Payer: Self-pay | Admitting: Emergency Medicine

## 2015-12-02 DIAGNOSIS — H109 Unspecified conjunctivitis: Secondary | ICD-10-CM | POA: Insufficient documentation

## 2015-12-02 DIAGNOSIS — F17210 Nicotine dependence, cigarettes, uncomplicated: Secondary | ICD-10-CM | POA: Insufficient documentation

## 2015-12-02 DIAGNOSIS — Z79899 Other long term (current) drug therapy: Secondary | ICD-10-CM | POA: Insufficient documentation

## 2015-12-02 MED ORDER — POLYMYXIN B-TRIMETHOPRIM 10000-0.1 UNIT/ML-% OP SOLN: 2.0000 [drp] | Freq: Four times a day (QID) | OPHTHALMIC | Status: DC

## 2015-12-02 NOTE — Discharge Instructions (Signed)
Bacterial Conjunctivitis °Bacterial conjunctivitis (commonly called pink eye) is redness, soreness, or puffiness (inflammation) of the white part of your eye. It is caused by a germ called bacteria. These germs can easily spread from person to person (contagious). Your eye often will become red or pink. Your eye may also become irritated, watery, or have a thick discharge.  °HOME CARE  °1. Apply a cool, clean washcloth over closed eyelids. Do this for 10-20 minutes, 3-4 times a day while you have pain. °2. Gently wipe away any fluid coming from the eye with a warm, wet washcloth or cotton ball. °3. Wash your hands often with soap and water. Use paper towels to dry your hands. °4. Do not share towels or washcloths. °5. Change or wash your pillowcase every day. °6. Do not use eye makeup until the infection is gone. °7. Do not use machines or drive if your vision is blurry. °8. Stop using contact lenses. Do not use them again until your doctor says it is okay. °9. Do not touch the tip of the eye drop bottle or medicine tube with your fingers when you put medicine on the eye. °GET HELP RIGHT AWAY IF:  °1. Your eye is not better after 3 days of starting your medicine. °2. You have a yellowish fluid coming out of the eye. °3. You have more pain in the eye. °4. Your eye redness is spreading. °5. Your vision becomes blurry. °6. You have a fever or lasting symptoms for more than 2-3 days. °7. You have a fever and your symptoms suddenly get worse. °8. You have pain in the face. °9. Your face gets red or puffy (swollen). °MAKE SURE YOU:  °· Understand these instructions. °· Will watch this condition. °· Will get help right away if you are not doing well or get worse. °  °This information is not intended to replace advice given to you by your health care provider. Make sure you discuss any questions you have with your health care provider. °  °Document Released: 06/16/2008 Document Revised: 08/24/2012 Document Reviewed:  05/13/2012 °Elsevier Interactive Patient Education ©2016 Elsevier Inc[employer] ° °How to Use Eye Drops and Eye Ointments °HOW TO APPLY EYE DROPS °Follow these steps when applying eye drops: °10. Wash your hands. °11. Tilt your head back. °12. Put a finger under your eye and use it to gently pull your lower lid downward. Keep that finger in place. °13. Using your other hand, hold the dropper between your thumb and index finger. °14. Position the dropper just over the edge of the lower lid. Hold it as close to your eye as you can without touching the dropper to your eye. °15. Steady your hand. One way to do this is to lean your index finger against your brow. °16. Look up. °17. Slowly and gently squeeze one drop of medicine into your eye. °18. Close your eye. °19. Place a finger between your lower eyelid and your nose. Press gently for 2 minutes. This increases the amount of time that the medicine is exposed to the eye. It also reduces side effects that can develop if the drop gets into the bloodstream through the nose. °HOW TO APPLY EYE OINTMENTS °Follow these steps when applying eye ointments: °10. Wash your hands. °11. Put a finger under your eye and use it to gently pull your lower lid downward. Keep that finger in place. °12. Using your other hand, place the tip of the tube between your thumb and index finger with   the remaining fingers braced against your cheek or nose. °13. Hold the tube just over the edge of your lower lid without touching the tube to your lid or eyeball. °14. Look up. °15. Line the inner part of your lower lid with ointment. °16. Gently pull up on your upper lid and look down. This will force the ointment to spread over the surface of the eye. °17. Release the upper lid. °18. If you can, close your eyes for 1-2 minutes. °Do not rub your eyes. If you applied the ointment correctly, your vision will be blurry for a few minutes. This is normal. °ADDITIONAL INFORMATION °· Make sure to use the eye drops or  ointment as told by your health care provider. °· If you have been told to use both eye drops and an eye ointment, apply the eye drops first, then wait 3-4 minutes before you apply the ointment. °· Try not to touch the tip of the dropper or tube to your eye. A dropper or tube that has touched the eye can become contaminated. °  °This information is not intended to replace advice given to you by your health care provider. Make sure you discuss any questions you have with your health care provider. °  °Document Released: 12/14/2000 Document Revised: 01/22/2015 Document Reviewed: 09/03/2014 °Elsevier Interactive Patient Education ©2016 [employer]vier Inc[employer] ° °

## 2015-12-02 NOTE — ED Notes (Signed)
Pt presents with left eye pain started yesterday. Redness noted to left eye.

## 2015-12-02 NOTE — ED Notes (Signed)
Left eye red 2 days ago  Woke up this am with increased redness with some drainage this am  Denies any injury

## 2015-12-02 NOTE — ED Provider Notes (Signed)
[facility] Emergency Department Provider Note  ____________________________________________  Time seen: Approximately 10:56 AM  I have reviewed the triage vital signs and the nursing notes.   HISTORY  Chief Complaint Eye Pain    HPI Vanessa Franco is a 35 y.o. female, NAD, presents to the emergency department with 2 days of left red eye. Woke this morning and had some drainage. Has not had any swelling. Does note some irritation but no overt pain to the left eye. Has had no injury or trauma to the eyes. Did have allergy type symptoms approximately 2 weeks ago. Does not currently have any upper respiratory symptoms, nasal congestion, headache.   History reviewed. No pertinent past medical history.  There are no active problems to display for this patient.   Past Surgical History  Procedure Laterality Date  . Myomectomy      Current Outpatient Rx  Name  Route  Sig  Dispense  Refill  . Prenatal Vit-Fe Fumarate-FA (PRENATAL MULTIVITAMIN) TABS tablet   Oral   Take 1 tablet by mouth daily at 12 noon.         . trimethoprim-polymyxin b (POLYTRIM) ophthalmic solution   Left Eye   Place 2 drops into the left eye every 6 (six) hours.   10 mL   0     Allergies Toradol and Sulfa antibiotics  No family history on file.  Social History Social History  Substance Use Topics  . Smoking status: Current Every Day Smoker -- 1.00 packs/day    Types: Cigarettes  . Smokeless tobacco: None  . Alcohol Use: Yes     Comment: occ     Review of Systems  Constitutional: No fever/chills Eyes: Positive discharge and redness to left eye. No visual changes.  ENT: Positive sneezing that has resolved. No sore throat or nasal congestion, runny nose, ear pain. Cardiovascular: No chest pain. Respiratory: No cough. No shortness of breath. No wheezing.  Musculoskeletal: Negative for general myalgias.  Skin: Negative for rash. Neurological: Negative for  headaches, focal weakness or numbness. 10-point ROS otherwise negative.  ____________________________________________   PHYSICAL EXAM:  VITAL SIGNS: ED Triage Vitals  Enc Vitals Group     BP 12/02/15 1015 133/89 mmHg     Pulse --      Resp 12/02/15 1015 20     Temp 12/02/15 1015 98.6 F (37 C)     Temp Source 12/02/15 1015 Oral     SpO2 12/02/15 1015 97 %     Weight 12/02/15 1015 160 lb (72.576 kg)     Height 12/02/15 1015 5\' 7"  (1.702 m)     Head Cir --      Peak Flow --      Pain Score 12/02/15 1021 6     Pain Loc --      Pain Edu? --      Excl. in [place]? --     Constitutional: Alert and oriented. Well appearing and in no acute distress. Eyes: Left conjunctivae with mild erythema. Clear drainage noted. Right eye without redness, swelling, discharge. PERRL. EOMI without pain.  Head: Atraumatic. ENT:      Ears: TMs visualized bilaterally without effusion, bulging, perforations.      Nose: No congestion/rhinnorhea.      Mouth/Throat: Mucous membranes are moist.  Neck: Supple with full range of motion Hematological/Lymphatic/Immunilogical: No cervical lymphadenopathy. Cardiovascular:  Good peripheral circulation. Respiratory: Normal respiratory effort without tachypnea or retractions.  Neurologic:  Normal speech and language. No gross  focal neurologic deficits are appreciated.  Skin:  Skin is warm, dry and intact. No rash noted. Psychiatric: Mood and affect are normal. Speech and behavior are normal. Patient exhibits appropriate insight and judgement.   ____________________________________________   LABS  None  ____________________________________________  EKG  None ____________________________________________  RADIOLOGY  None  ____________________________________________    PROCEDURES  Procedure(s) performed: None    Medications - No data to display   ____________________________________________   INITIAL IMPRESSION / ASSESSMENT AND PLAN / ED  COURSE  Patient's diagnosis is consistent with left conjunctivitis. Patient will be discharged home with prescriptions for Polytrim eyedrops to use as directed. Patient is to follow up with [facility] if symptoms persist past this treatment course. Patient is given ED precautions to return to the ED for any worsening or new symptoms.    ____________________________________________  FINAL CLINICAL IMPRESSION(S) / ED DIAGNOSES  Final diagnoses:  Conjunctivitis of left eye      NEW MEDICATIONS STARTED DURING THIS VISIT:  New Prescriptions   TRIMETHOPRIM-POLYMYXIN B (POLYTRIM) OPHTHALMIC SOLUTION    Place 2 drops into the left eye every 6 (six) hours.         Braxton Feathers, PA-C 12/02/15 1112  Lavonia Drafts, MD 12/02/15 1213

## 2015-12-20 ENCOUNTER — Encounter: Payer: Self-pay | Admitting: Emergency Medicine

## 2015-12-20 ENCOUNTER — Emergency Department
Admission: EM | Admit: 2015-12-20 | Discharge: 2015-12-20 | Disposition: A | Discharge status: Home / Self Care | Payer: Self-pay | Attending: Emergency Medicine | Admitting: Emergency Medicine

## 2015-12-20 ENCOUNTER — Emergency Department: Payer: Self-pay

## 2015-12-20 DIAGNOSIS — B3731 Acute candidiasis of vulva and vagina: Secondary | ICD-10-CM

## 2015-12-20 DIAGNOSIS — O26891 Other specified pregnancy related conditions, first trimester: Secondary | ICD-10-CM | POA: Insufficient documentation

## 2015-12-20 DIAGNOSIS — F17210 Nicotine dependence, cigarettes, uncomplicated: Secondary | ICD-10-CM | POA: Insufficient documentation

## 2015-12-20 DIAGNOSIS — Z9889 Other specified postprocedural states: Secondary | ICD-10-CM | POA: Insufficient documentation

## 2015-12-20 DIAGNOSIS — B373 Candidiasis of vulva and vagina: Secondary | ICD-10-CM | POA: Insufficient documentation

## 2015-12-20 DIAGNOSIS — Z3490 Encounter for supervision of normal pregnancy, unspecified, unspecified trimester: Secondary | ICD-10-CM

## 2015-12-20 DIAGNOSIS — Z3A01 Less than 8 weeks gestation of pregnancy: Secondary | ICD-10-CM | POA: Insufficient documentation

## 2015-12-20 LAB — COMPREHENSIVE METABOLIC PANEL
ALT: 10 U/L — ABNORMAL LOW (ref 14–54)
AST: 14 U/L — ABNORMAL LOW (ref 15–41)
Albumin: 4.1 g/dL (ref 3.5–5.0)
Alkaline Phosphatase: 57 U/L (ref 38–126)
Anion gap: 4 — ABNORMAL LOW (ref 5–15)
BUN: 9 mg/dL (ref 6–20)
CO2: 23 mmol/L (ref 22–32)
Calcium: 9.1 mg/dL (ref 8.9–10.3)
Chloride: 107 mmol/L (ref 101–111)
Creatinine, Ser: 0.62 mg/dL (ref 0.44–1.00)
GFR calc Af Amer: >60 mL/min (ref >60)
GFR calc non Af Amer: >60 mL/min (ref >60)
Glucose, Bld: 100 mg/dL — ABNORMAL HIGH (ref 65–99)
Potassium: 3.8 mmol/L (ref 3.5–5.1)
Sodium: 134 mmol/L — ABNORMAL LOW (ref 135–145)
Total Bilirubin: 0.5 mg/dL (ref 0.3–1.2)
Total Protein: 7 g/dL (ref 6.5–8.1)

## 2015-12-20 LAB — URINALYSIS COMPLETE WITH MICROSCOPIC (ARMC ONLY)
Bilirubin Urine: NEGATIVE
Glucose, UA: NEGATIVE mg/dL
Ketones, ur: NEGATIVE mg/dL
Nitrite: NEGATIVE
Protein, ur: NEGATIVE mg/dL
Specific Gravity, Urine: 1.023 (ref 1.005–1.030)
pH: 5 (ref 5.0–8.0)

## 2015-12-20 LAB — CBC
HCT: 37.4 % (ref 35.0–47.0)
Hemoglobin: 12.6 g/dL (ref 12.0–16.0)
MCH: 30.3 pg (ref 26.0–34.0)
MCHC: 33.8 g/dL (ref 32.0–36.0)
MCV: 89.6 fL (ref 80.0–100.0)
Platelets: 169 10*3/uL (ref 150–440)
RBC: 4.17 MIL/uL (ref 3.80–5.20)
RDW: 13.2 % (ref 11.5–14.5)
WBC: 6.6 10*3/uL (ref 3.6–11.0)

## 2015-12-20 LAB — CHLAMYDIA/NGC RT PCR (ARMC ONLY)
Chlamydia Tr: NOT DETECTED
N gonorrhoeae: NOT DETECTED

## 2015-12-20 LAB — WET PREP, GENITAL
Sperm: NONE SEEN
Trich, Wet Prep: NONE SEEN

## 2015-12-20 LAB — HCG, QUANTITATIVE, PREGNANCY: hCG, Beta Chain, Quant, S: 5997 m[IU]/mL — ABNORMAL HIGH (ref <5)

## 2015-12-20 LAB — POCT PREGNANCY, URINE: Preg Test, Ur: POSITIVE — AB

## 2015-12-20 MED ORDER — MICONAZOLE NITRATE 2 % VA CREA: 1.0000 | TOPICAL_CREAM | Freq: Every day | VAGINAL | Status: AC

## 2015-12-20 NOTE — Discharge Instructions (Signed)
First Trimester of Pregnancy The first trimester of pregnancy is from week 1 until the end of week 12 (months 1 through 3). A week after a sperm fertilizes an egg, the egg will implant on the wall of the uterus. This embryo will begin to develop into a baby. Genes from you and your partner are forming the baby. The female genes determine whether the baby is a boy or a girl. At 6-8 weeks, the eyes and face are formed, and the heartbeat can be seen on ultrasound. At the end of 12 weeks, all the baby's organs are formed.  Now that you are pregnant, you will want to do everything you can to have a healthy baby. Two of the most important things are to get good prenatal care and to follow your health care provider's instructions. Prenatal care is all the medical care you receive before the baby's birth. This care will help prevent, find, and treat any problems during the pregnancy and childbirth. BODY CHANGES Your body goes through many changes during pregnancy. The changes vary from woman to woman.   You may gain or lose a couple of pounds at first.  You may feel sick to your stomach (nauseous) and throw up (vomit). If the vomiting is uncontrollable, call your health care provider.  You may tire easily.  You may develop headaches that can be relieved by medicines approved by your health care provider.  You may urinate more often. Painful urination may mean you have a bladder infection.  You may develop heartburn as a result of your pregnancy.  You may develop constipation because certain hormones are causing the muscles that push waste through your intestines to slow down.  You may develop hemorrhoids or swollen, bulging veins (varicose veins).  Your breasts may begin to grow larger and become tender. Your nipples may stick out more, and the tissue that surrounds them (areola) may become darker.  Your gums may bleed and may be sensitive to brushing and flossing.  Dark spots or blotches (chloasma,  mask of pregnancy) may develop on your face. This will likely fade after the baby is born.  Your menstrual periods will stop.  You may have a loss of appetite.  You may develop cravings for certain kinds of food.  You may have changes in your emotions from day to day, such as being excited to be pregnant or being concerned that something may go wrong with the pregnancy and baby.  You may have more vivid and strange dreams.  You may have changes in your hair. These can include thickening of your hair, rapid growth, and changes in texture. Some women also have hair loss during or after pregnancy, or hair that feels dry or thin. Your hair will most likely return to normal after your baby is born. WHAT TO EXPECT AT YOUR PRENATAL VISITS During a routine prenatal visit:  You will be weighed to make sure you and the baby are growing normally.  Your blood pressure will be taken.  Your abdomen will be measured to track your baby's growth.  The fetal heartbeat will be listened to starting around week 10 or 12 of your pregnancy.  Test results from any previous visits will be discussed. Your health care provider may ask you:  How you are feeling.  If you are feeling the baby move.  If you have had any abnormal symptoms, such as leaking fluid, bleeding, severe headaches, or abdominal cramping.  If you are using any tobacco products,   including cigarettes, chewing tobacco, and electronic cigarettes.  If you have any questions. Other tests that may be performed during your first trimester include:  Blood tests to find your blood type and to check for the presence of any previous infections. They will also be used to check for low iron levels (anemia) and Rh antibodies. Later in the pregnancy, blood tests for diabetes will be done along with other tests if problems develop.  Urine tests to check for infections, diabetes, or protein in the urine.  An ultrasound to confirm the proper growth  and development of the baby.  An amniocentesis to check for possible genetic problems.  Fetal screens for spina bifida and Down syndrome.  You may need other tests to make sure you and the baby are doing well.  HIV (human immunodeficiency virus) testing. Routine prenatal testing includes screening for HIV, unless you choose not to have this test. HOME CARE INSTRUCTIONS  Medicines  Follow your health care provider's instructions regarding medicine use. Specific medicines may be either safe or unsafe to take during pregnancy.  Take your prenatal vitamins as directed.  If you develop constipation, try taking a stool softener if your health care provider approves. Diet  Eat regular, well-balanced meals. Choose a variety of foods, such as meat or vegetable-based protein, fish, milk and low-fat dairy products, vegetables, fruits, and whole grain breads and cereals. Your health care provider will help you determine the amount of weight gain that is right for you.  Avoid raw meat and uncooked cheese. These carry germs that can cause birth defects in the baby.  Eating four or five small meals rather than three large meals a day may help relieve nausea and vomiting. If you start to feel nauseous, eating a few soda crackers can be helpful. Drinking liquids between meals instead of during meals also seems to help nausea and vomiting.  If you develop constipation, eat more high-fiber foods, such as fresh vegetables or fruit and whole grains. Drink enough fluids to keep your urine clear or pale yellow. Activity and Exercise  Exercise only as directed by your health care provider. Exercising will help you:  Control your weight.  Stay in shape.  Be prepared for labor and delivery.  Experiencing pain or cramping in the lower abdomen or low back is a good sign that you should stop exercising. Check with your health care provider before continuing normal exercises.  Try to avoid standing for long  periods of time. Move your legs often if you must stand in one place for a long time.  Avoid heavy lifting.  Wear low-heeled shoes, and practice good posture.  You may continue to have sex unless your health care provider directs you otherwise. Relief of Pain or Discomfort  Wear a good support bra for breast tenderness.   Take warm sitz baths to soothe any pain or discomfort caused by hemorrhoids. Use hemorrhoid cream if your health care provider approves.   Rest with your legs elevated if you have leg cramps or low back pain.  If you develop varicose veins in your legs, wear support hose. Elevate your feet for 15 minutes, 3-4 times a day. Limit salt in your diet. Prenatal Care  Schedule your prenatal visits by the twelfth week of pregnancy. They are usually scheduled monthly at first, then more often in the last 2 months before delivery.  Write down your questions. Take them to your prenatal visits.  Keep all your prenatal visits as directed by your   health care provider. Safety  Wear your seat belt at all times when driving.  Make a list of emergency phone numbers, including numbers for family, friends, the hospital, and police and fire departments. General Tips  Ask your health care provider for a referral to a local prenatal education class. Begin classes no later than at the beginning of month 6 of your pregnancy.  Ask for help if you have counseling or nutritional needs during pregnancy. Your health care provider can offer advice or refer you to specialists for help with various needs.  Do not use hot tubs, steam rooms, or saunas.  Do not douche or use tampons or scented sanitary pads.  Do not cross your legs for long periods of time.  Avoid cat litter boxes and soil used by cats. These carry germs that can cause birth defects in the baby and possibly loss of the fetus by miscarriage or stillbirth.  Avoid all smoking, herbs, alcohol, and medicines not prescribed by  your health care provider. Chemicals in these affect the formation and growth of the baby.  Do not use any tobacco products, including cigarettes, chewing tobacco, and electronic cigarettes. If you need help quitting, ask your health care provider. You may receive counseling support and other resources to help you quit.  Schedule a dentist appointment. At home, brush your teeth with a soft toothbrush and be gentle when you floss. SEEK MEDICAL CARE IF:   You have dizziness.  You have mild pelvic cramps, pelvic pressure, or nagging pain in the abdominal area.  You have persistent nausea, vomiting, or diarrhea.  You have a bad smelling vaginal discharge.  You have pain with urination.  You notice increased swelling in your face, hands, legs, or ankles. SEEK IMMEDIATE MEDICAL CARE IF:   You have a fever.  You are leaking fluid from your vagina.  You have spotting or bleeding from your vagina.  You have severe abdominal cramping or pain.  You have rapid weight gain or loss.  You vomit blood or material that looks like coffee grounds.  You are exposed to German measles and have never had them.  You are exposed to fifth disease or chickenpox.  You develop a severe headache.  You have shortness of breath.  You have any kind of trauma, such as from a fall or a car accident.   This information is not intended to replace advice given to you by your health care provider. Make sure you discuss any questions you have with your health care provider.   Document Released: 09/01/2001 Document Revised: 09/28/2014 Document Reviewed: 07/18/2013 Elsevier Interactive Patient Education 2016 [employer]  

## 2015-12-20 NOTE — ED Provider Notes (Signed)
[facility] Emergency Department Provider Note  ____________________________________________    I have reviewed the triage vital signs and the nursing notes.   HISTORY  Chief Complaint Abdominal Pain    HPI Vanessa Franco is a 35 y.o. female who reports her last menstrual cycle Was at the end of February. She has had positive pregnancy test. She has had mild cramping in the left lower abdomen/pelvis. She is also noticed some white discharge. 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 She denies nausea or vomiting. She has not seen her OB yet.   History reviewed. No pertinent past medical history.  There are no active problems to display for this patient.   Past Surgical History  Procedure Laterality Date  . Myomectomy      Current Outpatient Rx  Name  Route  Sig  Dispense  Refill  . miconazole (EQL MICONAZOLE 7) 2 % vaginal cream   Vaginal   Place 1 Applicatorful vaginally at bedtime.   45 g   0   . Prenatal Vit-Fe Fumarate-FA (PRENATAL MULTIVITAMIN) TABS tablet   Oral   Take 1 tablet by mouth daily at 12 noon.         . trimethoprim-polymyxin b (POLYTRIM) ophthalmic solution   Left Eye   Place 2 drops into the left eye every 6 (six) hours.   10 mL   0     Allergies Toradol and Sulfa antibiotics  History reviewed. No pertinent family history.  Social History Social History  Substance Use Topics  . Smoking status: Current Every Day Smoker -- 1.00 packs/day    Types: Cigarettes  . Smokeless tobacco: None  . Alcohol Use: Yes     Comment: occ    Review of Systems  Constitutional: Negative for fever. Eyes: Negative for redness ENT: Negative for sore throat Cardiovascular: Negative for chest pain Respiratory: Negative for shortness of breath. Gastrointestinal: As above Genitourinary: Negative for dysuria. Positive for white vaginal discharge Musculoskeletal: Negative for back pain. Skin: Negative for  rash. Neurological: Negative for focal weakness Psychiatric: no anxiety    ____________________________________________   PHYSICAL EXAM:  VITAL SIGNS: ED Triage Vitals  Enc Vitals Group     BP 12/20/15 1741 153/91 mmHg     Pulse Rate 12/20/15 1741 82     Resp 12/20/15 1741 16     Temp 12/20/15 1741 99 F (37.2 C)     Temp Source 12/20/15 1741 Oral     SpO2 12/20/15 1741 99 %     Weight 12/20/15 1741 162 lb (73.483 kg)     Height 12/20/15 1741 5\' 7"  (1.702 m)     Head Cir --      Peak Flow --      Pain Score 12/20/15 1740 8     Pain Loc --      Pain Edu? --      Excl. in [place]? --      Constitutional: Alert and oriented. Well appearing and in no distress.  Eyes: Conjunctivae are normal. No erythema or injection ENT   Head: Normocephalic and atraumatic.   Mouth/Throat: Mucous membranes are moist. Cardiovascular: Normal rate, regular rhythm.  Respiratory: Normal respiratory effort without tachypnea nor retractions.  Gastrointestinal: Soft and non-tender in all quadrants. No distention. There is no CVA tenderness. Genitourinary: Thick white discharge, no CMT Musculoskeletal: Nontender with normal range of motion in all extremities. No lower extremity tenderness nor edema. Neurologic:  Normal speech and language. No gross focal neurologic  deficits are appreciated. Skin:  Skin is warm, dry and intact. No rash noted. Psychiatric: Mood and affect are normal. Patient exhibits appropriate insight and judgment.  ____________________________________________    LABS (pertinent positives/negatives)  Labs Reviewed  WET PREP, GENITAL - Abnormal; Notable for the following:    Yeast Wet Prep HPF POC PRESENT (*)    Clue Cells Wet Prep HPF POC PRESENT (*)    WBC, Wet Prep HPF POC MODERATE (*)    All other components within normal limits  URINALYSIS COMPLETEWITH MICROSCOPIC ([facility] ONLY) - Abnormal; Notable for the following:    Color, Urine YELLOW (*)    APPearance HAZY (*)     Hgb urine dipstick 1+ (*)    Leukocytes, UA TRACE (*)    Bacteria, UA RARE (*)    Squamous Epithelial / LPF 6-30 (*)    All other components within normal limits  COMPREHENSIVE METABOLIC PANEL - Abnormal; Notable for the following:    Sodium 134 (*)    Glucose, Bld 100 (*)    AST 14 (*)    ALT 10 (*)    Anion gap 4 (*)    All other components within normal limits  HCG, QUANTITATIVE, PREGNANCY - Abnormal; Notable for the following:    hCG, Beta Chain, Quant, S 5997 (*)    All other components within normal limits  POCT PREGNANCY, URINE - Abnormal; Notable for the following:    Preg Test, Ur POSITIVE (*)    All other components within normal limits  CHLAMYDIA/NGC RT PCR ([facility] ONLY)  CBC  POC URINE PREG, ED    ____________________________________________   EKG  None  ____________________________________________    RADIOLOGY  Ultrasound shows IUP  ____________________________________________   PROCEDURES  Procedure(s) performed: none  Critical Care performed: none  ____________________________________________   INITIAL IMPRESSION / ASSESSMENT AND PLAN / ED COURSE  Pertinent labs & imaging results that were available during my care of the patient were reviewed by me and considered in my medical decision making (see chart for details).  Ultrasound shows IUP. No vaginal bleeding. Discharge is consistent with yeast infection, we will treat with vaginal cream given pregnancy status. Follow up with OB  ____________________________________________   FINAL CLINICAL IMPRESSION(S) / ED DIAGNOSES  Final diagnoses:  Pregnancy  Yeast infection of the vagina          Lavonia Drafts, MD 12/20/15 2326

## 2015-12-20 NOTE — ED Notes (Addendum)
Pt reports LLQ/ left suprapubic pain X 1 week.  Denies nausea or vomiting.  Denies vaginal bleeding.  Has had white vaginal discharge.  Pt took home pregnancy test and reports she is pregnant but has had no prenatal care yet.  Reports 2 miscarriages in past and myomectomy . Pt texting whole time in triage without looking up to answer questions. Has had diarrhea.

## 2016-01-01 ENCOUNTER — Emergency Department
Admission: EM | Admit: 2016-01-01 | Discharge: 2016-01-01 | Disposition: A | Discharge status: Home / Self Care | Payer: Medicaid Other | Attending: Student | Admitting: Student

## 2016-01-01 ENCOUNTER — Encounter: Payer: Self-pay | Admitting: Emergency Medicine

## 2016-01-01 DIAGNOSIS — O26891 Other specified pregnancy related conditions, first trimester: Secondary | ICD-10-CM | POA: Insufficient documentation

## 2016-01-01 DIAGNOSIS — O09291 Supervision of pregnancy with other poor reproductive or obstetric history, first trimester: Secondary | ICD-10-CM

## 2016-01-01 DIAGNOSIS — R1030 Lower abdominal pain, unspecified: Secondary | ICD-10-CM | POA: Diagnosis present

## 2016-01-01 DIAGNOSIS — F17200 Nicotine dependence, unspecified, uncomplicated: Secondary | ICD-10-CM | POA: Diagnosis not present

## 2016-01-01 DIAGNOSIS — R350 Frequency of micturition: Secondary | ICD-10-CM | POA: Insufficient documentation

## 2016-01-01 LAB — URINALYSIS COMPLETE WITH MICROSCOPIC (ARMC ONLY)
Bilirubin Urine: NEGATIVE
Glucose, UA: NEGATIVE mg/dL
Hgb urine dipstick: NEGATIVE
Ketones, ur: NEGATIVE mg/dL
Leukocytes, UA: NEGATIVE
Nitrite: NEGATIVE
Protein, ur: NEGATIVE mg/dL
Specific Gravity, Urine: 1.003 — ABNORMAL LOW (ref 1.005–1.030)
pH: 6 (ref 5.0–8.0)

## 2016-01-01 LAB — WET PREP, GENITAL
Sperm: NONE SEEN
Trich, Wet Prep: NONE SEEN
Yeast Wet Prep HPF POC: NONE SEEN

## 2016-01-01 LAB — HEMOGLOBIN AND HEMATOCRIT, BLOOD
HCT: 35.6 % (ref 35.0–47.0)
Hemoglobin: 12.1 g/dL (ref 12.0–16.0)

## 2016-01-01 LAB — HCG, QUANTITATIVE, PREGNANCY: hCG, Beta Chain, Quant, S: 85343 m[IU]/mL — ABNORMAL HIGH (ref <5)

## 2016-01-01 NOTE — ED Provider Notes (Signed)
[facility] Emergency Department Provider Note  ____________________________________________  Time seen: Approximately 1:12 PM  I have reviewed the triage vital signs and the nursing notes.   HISTORY  Chief Complaint Urinary Frequency and Abdominal Pain    HPI Vanessa Franco is a 35 y.o. female is here with complaint of urinary frequency. Patient states that she is also had some mid lower abdominal pain that started today. She denies any discharge or vaginal leakage. Patient was seen in the emergency room on 3/31 at which time she had positive pregnancy test and was treated for a vaginal yeast infection. Patient states that she used all the vaginal cream. Patient has had 2 prior miscarriages and is extremely anxious. She denies any nausea, vomiting or diarrhea. Patient has not seen an OB/GYN nor has she called to make an appointment since her last ER visit. Currently she denies any pain.   History reviewed. No pertinent past medical history.  There are no active problems to display for this patient.   Past Surgical History  Procedure Laterality Date  . Myomectomy      Current Outpatient Rx  Name  Route  Sig  Dispense  Refill  . Prenatal Vit-Fe Fumarate-FA (PRENATAL MULTIVITAMIN) TABS tablet   Oral   Take 1 tablet by mouth daily at 12 noon.         . trimethoprim-polymyxin b (POLYTRIM) ophthalmic solution   Left Eye   Place 2 drops into the left eye every 6 (six) hours.   10 mL   0     Allergies Toradol and Sulfa antibiotics  History reviewed. No pertinent family history.  Social History Social History  Substance Use Topics  . Smoking status: Current Some Day Smoker -- 0.00 packs/day  . Smokeless tobacco: None  . Alcohol Use: Yes     Comment: occ    Review of Systems Constitutional: No fever/chills Cardiovascular: Denies chest pain. Respiratory: Denies shortness of breath. Gastrointestinal: Mild lower abdominal pain.  No nausea,  no vomiting.  Genitourinary: Positive frequency Musculoskeletal: Negative for back pain. Skin: Negative for rash. Neurological: Negative for headaches, focal weakness or numbness.  10-point ROS otherwise negative.  ____________________________________________   PHYSICAL EXAM:  VITAL SIGNS: ED Triage Vitals  Enc Vitals Group     BP 01/01/16 1303 110/87 mmHg     Pulse Rate 01/01/16 1303 74     Resp 01/01/16 1303 16     Temp 01/01/16 1303 98.2 F (36.8 C)     Temp Source 01/01/16 1303 Oral     SpO2 01/01/16 1303 100 %     Weight 01/01/16 1303 162 lb (73.483 kg)     Height 01/01/16 1303 5\' 7"  (1.702 m)     Head Cir --      Peak Flow --      Pain Score --      Pain Loc --      Pain Edu? --      Excl. in [place]? --     Constitutional: Alert and oriented. Well appearing and in no acute distress. Eyes: Conjunctivae are normal. PERRL. EOMI. Head: Atraumatic. Nose: No congestion/rhinnorhea. Mouth/Throat: Mucous membranes are moist.  Oropharynx non-erythematous. Neck: No stridor.  Cardiovascular: Normal rate, regular rhythm. Grossly normal heart sounds.  Good peripheral circulation. Respiratory: Normal respiratory effort.  No retractions. Lungs CTAB. Gastrointestinal: Soft and nontender. No distention. Genitourinary: Pelvic exam no bloody discharge was seen. Cervical os no dilation.On bimanual exam there is no adnexal masses or  tenderness noted and also no cervical motion tenderness.  Musculoskeletal: No lower extremity tenderness nor edema.  No joint effusions. Neurologic:  Normal speech and language. No gross focal neurologic deficits are appreciated. No gait instability. Skin:  Skin is warm, dry and intact. No rash noted. Psychiatric: Mood and affect are normal. Speech and behavior are normal.  ____________________________________________   LABS (all labs ordered are listed, but only abnormal results are displayed)  Labs Reviewed  WET PREP, GENITAL - Abnormal; Notable for  the following:    Clue Cells Wet Prep HPF POC FEW (*)    WBC, Wet Prep HPF POC FEW (*)    All other components within normal limits  URINALYSIS COMPLETEWITH MICROSCOPIC ([facility] ONLY) - Abnormal; Notable for the following:    Color, Urine STRAW (*)    APPearance CLEAR (*)    Specific Gravity, Urine 1.003 (*)    Bacteria, UA RARE (*)    Squamous Epithelial / LPF 0-5 (*)    All other components within normal limits  HCG, QUANTITATIVE, PREGNANCY - Abnormal; Notable for the following:    hCG, Beta Chain, Quant, S 85343 (*)    All other components within normal limits  HEMOGLOBIN AND HEMATOCRIT, BLOOD    PROCEDURES  Procedure(s) performed: None  Critical Care performed: No  ____________________________________________   INITIAL IMPRESSION / ASSESSMENT AND PLAN / ED COURSE  Pertinent labs & imaging results that were available during my care of the patient were reviewed by me and considered in my medical decision making (see chart for details).  Patient was made aware of all her test results. She was given the name of the OB/GYN that is on call for her to follow up with or she may go to the [place] for prenatal care. She is encouraged to call this week to make an appointment. Patient states she still is somewhat anxious because she has had 2 spontaneous abortions in the past. ____________________________________________   FINAL CLINICAL IMPRESSION(S) / ED DIAGNOSES  Final diagnoses:  Pregnancy in first trimester with history of abortion      Johnn Hai, PA-C 01/01/16 1603  Joanne Gavel, MD 01/01/16 1627

## 2016-01-01 NOTE — Discharge Instructions (Signed)
Call tomorrow for an appointment with an OB/GYN.

## 2016-01-01 NOTE — ED Notes (Signed)
Pt reports mid lower abdominal pain that started today with urinary frequency. Pt states she is 6 weeks and 2 days. Pt states she is a G3L0 at this time. Pt denies burning when she urinates, denies odor, denies discoloration of urine. Pt also denies vaginal bleeding at this time.

## 2016-01-14 ENCOUNTER — Ambulatory Visit (INDEPENDENT_AMBULATORY_CARE_PROVIDER_SITE_OTHER): Payer: Self-pay | Admitting: Obstetrics and Gynecology

## 2016-01-14 VITALS — BP 121/72 | HR 81 | Wt 161.4 lb

## 2016-01-14 DIAGNOSIS — Z331 Pregnant state, incidental: Secondary | ICD-10-CM

## 2016-01-14 DIAGNOSIS — Z3490 Encounter for supervision of normal pregnancy, unspecified, unspecified trimester: Secondary | ICD-10-CM

## 2016-01-14 DIAGNOSIS — Z36 Encounter for antenatal screening of mother: Secondary | ICD-10-CM

## 2016-01-14 DIAGNOSIS — Z113 Encounter for screening for infections with a predominantly sexual mode of transmission: Secondary | ICD-10-CM

## 2016-01-14 DIAGNOSIS — Z369 Encounter for antenatal screening, unspecified: Secondary | ICD-10-CM

## 2016-01-14 DIAGNOSIS — Z1389 Encounter for screening for other disorder: Secondary | ICD-10-CM

## 2016-01-14 NOTE — Patient Instructions (Signed)
Pregnancy and Zika Virus Disease Zika virus disease, or Zika, is an illness that can spread to people from mosquitoes that carry the virus. It may also spread from person to person through infected body fluids. Zika first occurred in [place], but recently it has spread to new areas. The virus occurs in tropical climates. The location of Zika continues to change. Most people who become infected with Zika virus do not develop serious illness. However, Zika may cause birth defects in an unborn baby whose mother is infected with the virus. It may also increase the risk of miscarriage. WHAT ARE THE SYMPTOMS OF ZIKA VIRUS DISEASE? In many cases, people who have been infected with Zika virus do not develop any symptoms. If symptoms appear, they usually start about a week after the person is infected. Symptoms are usually mild. They may include:  Fever.  Rash.  Red eyes.  Joint pain. HOW DOES ZIKA VIRUS DISEASE SPREAD? The main way that Zika virus spreads is through the bite of a certain type of mosquito. Unlike most types of mosquitos, which bite only at night, the type of mosquito that carries Zika virus bites both at night and during the day. Zika virus can also spread through sexual contact, through a blood transfusion, and from a mother to her baby before or during birth. Once you have had Zika virus disease, it is unlikely that you will get it again. CAN I PASS ZIKA TO MY BABY DURING PREGNANCY? Yes, Zika can pass from a mother to her baby before or during birth. WHAT PROBLEMS CAN ZIKA CAUSE FOR MY BABY? A woman who is infected with Zika virus while pregnant is at risk of having her baby born with a condition in which the brain or head is smaller than expected (microcephaly). Babies who have microcephaly can have developmental delays, seizures, hearing problems, and vision problems. Having Zika virus disease during pregnancy can also increase the risk of miscarriage. HOW CAN ZIKA VIRUS DISEASE BE  PREVENTED? There is no vaccine to prevent Zika. The best way to prevent the disease is to avoid infected mosquitoes and avoid exposure to body fluids that can spread the virus. Avoid any possible exposure to Zika by taking the following precautions. For women and their sex partners:  Avoid traveling to high-risk areas. The locations where Zika is being reported change often. To identify high-risk areas, check the CDC travel website: www.cdc.gov/zika/geo/index.html  If you or your sex partner must travel to a high-risk area, talk with a health care provider before and after traveling.  Take all precautions to avoid mosquito bites if you live in, or travel to, any of the high-risk areas. Insect repellents are safe to use during pregnancy.  Ask your health care provider when it is safe to have sexual contact. For women:  If you are pregnant or trying to become pregnant, avoid sexual contact with persons who may have been exposed to Zika virus, persons who have possible symptoms of Zika, or persons whose history you are unsure about. If you choose to have sexual contact with someone who may have been exposed to Zika virus, use condoms correctly during the entire duration of sexual activity, every time. Do not share sexual devices, as you may be exposed to body fluids.  Ask your health care provider about when it is safe to attempt pregnancy after a possible exposure to Zika virus. WHAT STEPS SHOULD I TAKE TO AVOID MOSQUITO BITES? Take these steps to avoid mosquito bites when you are   in a high-risk area:  Wear loose clothing that covers your arms and legs.  Limit your outdoor activities.  Do not open windows unless they have window screens.  Sleep under mosquito nets.  Use insect repellent. The best insect repellents have:  DEET, picaridin, oil of lemon eucalyptus (OLE), or [id number] in them.  Higher amounts of an active ingredient in them.  Remember that insect repellents are safe to use  during pregnancy.  Do not use OLE on children who are younger than 3 years of age. Do not use insect repellent on babies who are younger than 2 months of age.  Cover your child's stroller with mosquito netting. Make sure the netting fits snugly and that any loose netting does not cover your child's mouth or nose. Do not use a blanket as a mosquito-protection cover.  Do not apply insect repellent underneath clothing.  If you are using sunscreen, apply the sunscreen before applying the insect repellent.  Treat clothing with permethrin. Do not apply permethrin directly to your skin. Follow label directions for safe use.  Get rid of standing water, where mosquitoes may reproduce. Standing water is often found in items such as buckets, bowls, animal food dishes, and flowerpots. When you return from traveling to any high-risk area, continue taking actions to protect yourself against mosquito bites for 3 weeks, even if you show no signs of illness. This will prevent spreading Zika virus to uninfected mosquitoes. WHAT SHOULD I KNOW ABOUT THE SEXUAL TRANSMISSION OF ZIKA? People can spread Zika to their sexual partners during vaginal, anal, or oral sex, or by sharing sexual devices. Many people with Zika do not develop symptoms, so a person could spread the disease without knowing that they are infected. The greatest risk is to women who are pregnant or who may become pregnant. Zika virus can live longer in semen than it can live in blood. Couples can prevent sexual transmission of the virus by:  Using condoms correctly during the entire duration of sexual activity, every time. This includes vaginal, anal, and oral sex.  Not sharing sexual devices. Sharing increases your risk of being exposed to body fluid from another person.  Avoiding all sexual activity until your health care provider says it is safe. SHOULD I BE TESTED FOR ZIKA VIRUS? A sample of your blood can be tested for Zika virus. A pregnant  woman should be tested if she may have been exposed to the virus or if she has symptoms of Zika. She may also have additional tests done during her pregnancy, such ultrasound testing. Talk with your health care provider about which tests are recommended.   This information is not intended to replace advice given to you by your health care provider. Make sure you discuss any questions you have with your health care provider.   Document Released: 05/29/2015 Document Reviewed: 05/22/2015 Elsevier Interactive Patient Education 2016 [employer] Minor Illnesses and Medications in Pregnancy  Cold/Flu:  Sudafed for congestion- Robitussin (plain) for cough- Tylenol for discomfort.  Please follow the directions on the label.  Try not to take any more than needed.  OTC Saline nasal spray and air humidifier or cool-mist  Vaporizer to sooth nasal irritation and to loosen congestion.  It is also important to increase intake of non carbonated fluids, especially if you have a fever.  Constipation:  Colace-2 capsules at bedtime; Metamucil- follow directions on label; Senokot- 1 tablet at bedtime.  Any one of these medications can be used.  It is also   very important to increase fluids and fruits along with regular exercise.  If problem persists please call the office.  Diarrhea:  Kaopectate as directed on the label.  Eat a bland diet and increase fluids.  Avoid highly seasoned foods.  Headache:  Tylenol 1 or 2 tablets every 3-4 hours as needed  Indigestion:  Maalox, Mylanta, Tums or Rolaids- as directed on label.  Also try to eat small meals and avoid fatty, greasy or spicy foods.  Nausea with or without Vomiting:  Nausea in pregnancy is caused by increased levels of hormones in the body which influence the digestive system and cause irritation when stomach acids accumulate.  Symptoms usually subside after 1st trimester of pregnancy.  Try the following:  Keep saltines, graham crackers or dry toast by your bed  to eat upon awakening.  Don't let your stomach get empty.  Try to eat 5-6 small meals per day instead of 3 large ones.  Avoid greasy fatty or highly seasoned foods.   Take OTC Unisom 1 tablet at bed time along with OTC Vitamin B6 25-50 mg 3 times per day.    If nausea continues with vomiting and you are unable to keep down food and fluids you may need a prescription medication.  Please notify your provider.   Sore throat:  Chloraseptic spray, throat lozenges and or plain Tylenol.  Vaginal Yeast Infection:  OTC Monistat for 7 days as directed on label.  If symptoms do not resolve within a week notify provider.  If any of the above problems do not subside with recommended treatment please call the office for further assistance.   Do not take Aspirin, Advil, Motrin or Ibuprofen.  * * OTC= Over the counter Hyperemesis Gravidarum Hyperemesis gravidarum is a severe form of nausea and vomiting that happens during pregnancy. Hyperemesis is worse than morning sickness. It may cause you to have nausea or vomiting all day for many days. It may keep you from eating and drinking enough food and liquids. Hyperemesis usually occurs during the first half (the first 20 weeks) of pregnancy. It often goes away once a woman is in her second half of pregnancy. However, sometimes hyperemesis continues through an entire pregnancy.  CAUSES  The cause of this condition is not completely known but is thought to be related to changes in the body's hormones when pregnant. It could be from the high level of the pregnancy hormone or an increase in estrogen in the body.  SIGNS AND SYMPTOMS   Severe nausea and vomiting.  Nausea that does not go away.  Vomiting that does not allow you to keep any food down.  Weight loss and body fluid loss (dehydration).  Having no desire to eat or not liking food you have previously enjoyed. DIAGNOSIS  Your health care provider will do a physical exam and ask you about your symptoms.  He or she may also order blood tests and urine tests to make sure something else is not causing the problem.  TREATMENT  You may only need medicine to control the problem. If medicines do not control the nausea and vomiting, you will be treated in the hospital to prevent dehydration, increased acid in the blood (acidosis), weight loss, and changes in the electrolytes in your body that may harm the unborn baby (fetus). You may need IV fluids.  HOME CARE INSTRUCTIONS   Only take over-the-counter or prescription medicines as directed by your health care provider.  Try eating a couple of dry crackers or   toast in the morning before getting out of bed.  Avoid foods and smells that upset your stomach.  Avoid fatty and spicy foods.  Eat 5-6 small meals a day.  Do not drink when eating meals. Drink between meals.  For snacks, eat high-protein foods, such as cheese.  Eat or suck on things that have ginger in them. Ginger helps nausea.  Avoid food preparation. The smell of food can spoil your appetite.  Avoid iron pills and iron in your multivitamins until after 3-4 months of being pregnant. However, consult with your health care provider before stopping any prescribed iron pills. SEEK MEDICAL CARE IF:   Your abdominal pain increases.  You have a severe headache.  You have vision problems.  You are losing weight. SEEK IMMEDIATE MEDICAL CARE IF:   You are unable to keep fluids down.  You vomit blood.  You have constant nausea and vomiting.  You have excessive weakness.  You have extreme thirst.  You have dizziness or fainting.  You have a fever or persistent symptoms for more than 2-3 days.  You have a fever and your symptoms suddenly get worse. MAKE SURE YOU:   Understand these instructions.  Will watch your condition.  Will get help right away if you are not doing well or get worse.   This information is not intended to replace advice given to you by your health care  provider. Make sure you discuss any questions you have with your health care provider.   Document Released: 09/07/2005 Document Revised: 06/28/2013 Document Reviewed: 04/19/2013 Elsevier Interactive Patient Education 2016 [employer]. Commonly Asked Questions During Pregnancy  Cats: A parasite can be excreted in cat feces.  To avoid exposure you need to have another person empty the little box.  If you must empty the litter box you will need to wear gloves.  Wash your hands after handling your cat.  This parasite can also be found in raw or undercooked meat so this should also be avoided.  Colds, Sore Throats, Flu: Please check your medication sheet to see what you can take for symptoms.  If your symptoms are unrelieved by these medications please call the office.  Dental Work: Most any dental work your dentist recommends is permitted.  X-rays should only be taken during the first trimester if absolutely necessary.  Your abdomen should be shielded with a lead apron during all x-rays.  Please notify your provider prior to receiving any x-rays.  Novocaine is fine; gas is not recommended.  If your dentist requires a note from us prior to dental work please call the office and we will provide one for you.  Exercise: Exercise is an important part of staying healthy during your pregnancy.  You may continue most exercises you were accustomed to prior to pregnancy.  Later in your pregnancy you will most likely notice you have difficulty with activities requiring balance like riding a bicycle.  It is important that you listen to your body and avoid activities that put you at a higher risk of falling.  Adequate rest and staying well hydrated are a must!  If you have questions about the safety of specific activities ask your provider.    Exposure to Children with illness: Try to avoid obvious exposure; report any symptoms to us when noted,  If you have chicken pos, red measles or mumps, you should be immune to  these diseases.   Please do not take any vaccines while pregnant unless you have checked with   your OB provider.  Fetal Movement: After 28 weeks we recommend you do "kick counts" twice daily.  Lie or sit down in a calm quiet environment and count your baby movements "kicks".  You should feel your baby at least 10 times per hour.  If you have not felt 10 kicks within the first hour get up, walk around and have something sweet to eat or drink then repeat for an additional hour.  If count remains less than 10 per hour notify your provider.  Fumigating: Follow your pest control agent's advice as to how long to stay out of your home.  Ventilate the area well before re-entering.  Hemorrhoids:   Most over-the-counter preparations can be used during pregnancy.  Check your medication to see what is safe to use.  It is important to use a stool softener or fiber in your diet and to drink lots of liquids.  If hemorrhoids seem to be getting worse please call the office.   Hot Tubs:  Hot tubs Jacuzzis and saunas are not recommended while pregnant.  These increase your internal body temperature and should be avoided.  Intercourse:  Sexual intercourse is safe during pregnancy as long as you are comfortable, unless otherwise advised by your provider.  Spotting may occur after intercourse; report any bright red bleeding that is heavier than spotting.  Labor:  If you know that you are in labor, please go to the hospital.  If you are unsure, please call the office and let us help you decide what to do.  Lifting, straining, etc:  If your job requires heavy lifting or straining please check with your provider for any limitations.  Generally, you should not lift items heavier than that you can lift simply with your hands and arms (no back muscles)  Painting:  Paint fumes do not harm your pregnancy, but may make you ill and should be avoided if possible.  Latex or water based paints have less odor than oils.  Use adequate  ventilation while painting.  Permanents & Hair Color:  Chemicals in hair dyes are not recommended as they cause increase hair dryness which can increase hair loss during pregnancy.  " Highlighting" and permanents are allowed.  Dye may be absorbed differently and permanents may not hold as well during pregnancy.  Sunbathing:  Use a sunscreen, as skin burns easily during pregnancy.  Drink plenty of fluids; avoid over heating.  Tanning Beds:  Because their possible side effects are still unknown, tanning beds are not recommended.  Ultrasound Scans:  Routine ultrasounds are performed at approximately 20 weeks.  You will be able to see your baby's general anatomy an if you would like to know the gender this can usually be determined as well.  If it is questionable when you conceived you may also receive an ultrasound early in your pregnancy for dating purposes.  Otherwise ultrasound exams are not routinely performed unless there is a medical necessity.  Although you can request a scan we ask that you pay for it when conducted because insurance does not cover " patient request" scans.  Work: If your pregnancy proceeds without complications you may work until your due date, unless your physician or employer advises otherwise.  Round Ligament Pain/Pelvic Discomfort:  Sharp, shooting pains not associated with bleeding are fairly common, usually occurring in the second trimester of pregnancy.  They tend to be worse when standing up or when you remain standing for long periods of time.  These are the result   of pressure of certain pelvic ligaments called "round ligaments".  Rest, Tylenol and heat seem to be the most effective relief.  As the womb and fetus grow, they rise out of the pelvis and the discomfort improves.  Please notify the office if your pain seems different than that described.  It may represent a more serious condition.   
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
## 2016-01-15 LAB — CBC WITH DIFFERENTIAL/PLATELET
Basophils Absolute: 0 10*3/uL (ref 0.0–0.2)
Basos: 0 %
EOS (ABSOLUTE): 0.2 10*3/uL (ref 0.0–0.4)
Eos: 2 %
Hematocrit: 34.1 % (ref 34.0–46.6)
Hemoglobin: 11.5 g/dL (ref 11.1–15.9)
Immature Grans (Abs): 0 10*3/uL (ref 0.0–0.1)
Immature Granulocytes: 0 %
Lymphocytes Absolute: 1.9 10*3/uL (ref 0.7–3.1)
Lymphs: 25 %
MCH: 30 pg (ref 26.6–33.0)
MCHC: 33.7 g/dL (ref 31.5–35.7)
MCV: 89 fL (ref 79–97)
Monocytes Absolute: 0.6 10*3/uL (ref 0.1–0.9)
Monocytes: 8 %
Neutrophils Absolute: 4.9 10*3/uL (ref 1.4–7.0)
Neutrophils: 65 %
Platelets: 211 10*3/uL (ref 150–379)
RBC: 3.83 x10E6/uL (ref 3.77–5.28)
RDW: 13.1 % (ref 12.3–15.4)
WBC: 7.7 10*3/uL (ref 3.4–10.8)

## 2016-01-15 LAB — SICKLE CELL SCREEN: Sickle Cell Screen: NEGATIVE

## 2016-01-15 LAB — RUBELLA ANTIBODY, IGM: Rubella IgM: <20 AU/mL (ref 0.0–19.9)

## 2016-01-15 LAB — RPR: RPR Ser Ql: NONREACTIVE

## 2016-01-15 LAB — RH TYPE: Rh Factor: POSITIVE

## 2016-01-15 LAB — HIV ANTIBODY (ROUTINE TESTING W REFLEX): HIV Screen 4th Generation wRfx: NONREACTIVE

## 2016-01-15 LAB — ANTIBODY SCREEN: Antibody Screen: NEGATIVE

## 2016-01-15 LAB — ABO

## 2016-01-15 LAB — HEPATITIS B SURFACE ANTIGEN: Hepatitis B Surface Ag: NEGATIVE

## 2016-01-15 LAB — VARICELLA ZOSTER ANTIBODY, IGM: Varicella IgM: <0.91 index (ref 0.00–0.90)

## 2016-01-16 LAB — PAIN MGT SCRN (14 DRUGS), UR
Amphetamine Screen, Ur: NEGATIVE ng/mL
Barbiturate Screen, Ur: NEGATIVE ng/mL
Benzodiazepine Screen, Urine: NEGATIVE ng/mL
Buprenorphine, Urine: NEGATIVE ng/mL
Cannabinoids Ur Ql Scn: POSITIVE ng/mL
Cocaine(Metab.)Screen, Urine: NEGATIVE ng/mL
Creatinine(Crt), U: 152.2 mg/dL (ref 20.0–300.0)
Fentanyl, Urine: NEGATIVE pg/mL
Meperidine Screen, Urine: NEGATIVE ng/mL
Methadone Scn, Ur: NEGATIVE ng/mL
Opiate Scrn, Ur: NEGATIVE ng/mL
Oxycodone+Oxymorphone Ur Ql Scn: NEGATIVE ng/mL
PCP Scrn, Ur: NEGATIVE ng/mL
Ph of Urine: 7.2 (ref 4.5–8.9)
Propoxyphene, Screen: NEGATIVE ng/mL
Tramadol Ur Ql Scn: NEGATIVE ng/mL

## 2016-01-16 LAB — URINE CULTURE, OB REFLEX

## 2016-01-16 LAB — GC/CHLAMYDIA PROBE AMP
Chlamydia trachomatis, NAA: NEGATIVE
Neisseria gonorrhoeae by PCR: NEGATIVE

## 2016-01-16 LAB — URINALYSIS, ROUTINE W REFLEX MICROSCOPIC
Bilirubin, UA: NEGATIVE
Glucose, UA: NEGATIVE
Ketones, UA: NEGATIVE
Leukocytes, UA: NEGATIVE
Nitrite, UA: NEGATIVE
Protein, UA: NEGATIVE
RBC, UA: NEGATIVE
Specific Gravity, UA: 1.02 (ref 1.005–1.030)
Urobilinogen, Ur: 1 mg/dL (ref 0.2–1.0)
pH, UA: 7.5 (ref 5.0–7.5)

## 2016-01-16 LAB — CULTURE, OB URINE

## 2016-01-16 LAB — NICOTINE SCREEN, URINE: Cotinine Ql Scrn, Ur: POSITIVE ng/mL

## 2016-01-22 ENCOUNTER — Emergency Department
Admission: EM | Admit: 2016-01-22 | Discharge: 2016-01-22 | Disposition: A | Discharge status: Home / Self Care | Payer: Medicaid Other | Attending: Emergency Medicine | Admitting: Emergency Medicine

## 2016-01-22 ENCOUNTER — Encounter: Payer: Self-pay | Admitting: *Deleted

## 2016-01-22 DIAGNOSIS — F329 Major depressive disorder, single episode, unspecified: Secondary | ICD-10-CM | POA: Diagnosis not present

## 2016-01-22 DIAGNOSIS — O219 Vomiting of pregnancy, unspecified: Secondary | ICD-10-CM | POA: Diagnosis not present

## 2016-01-22 DIAGNOSIS — O26891 Other specified pregnancy related conditions, first trimester: Secondary | ICD-10-CM | POA: Diagnosis not present

## 2016-01-22 DIAGNOSIS — F17200 Nicotine dependence, unspecified, uncomplicated: Secondary | ICD-10-CM | POA: Insufficient documentation

## 2016-01-22 DIAGNOSIS — Z3A09 9 weeks gestation of pregnancy: Secondary | ICD-10-CM | POA: Diagnosis not present

## 2016-01-22 DIAGNOSIS — O26899 Other specified pregnancy related conditions, unspecified trimester: Secondary | ICD-10-CM

## 2016-01-22 DIAGNOSIS — R1030 Lower abdominal pain, unspecified: Secondary | ICD-10-CM | POA: Diagnosis not present

## 2016-01-22 DIAGNOSIS — R109 Unspecified abdominal pain: Secondary | ICD-10-CM

## 2016-01-22 LAB — HCG, QUANTITATIVE, PREGNANCY: hCG, Beta Chain, Quant, S: 185815 m[IU]/mL — ABNORMAL HIGH (ref <5)

## 2016-01-22 LAB — COMPREHENSIVE METABOLIC PANEL
ALT: 12 U/L — ABNORMAL LOW (ref 14–54)
AST: 17 U/L (ref 15–41)
Albumin: 4.3 g/dL (ref 3.5–5.0)
Alkaline Phosphatase: 44 U/L (ref 38–126)
Anion gap: 10 (ref 5–15)
BUN: 8 mg/dL (ref 6–20)
CO2: 22 mmol/L (ref 22–32)
Calcium: 9.5 mg/dL (ref 8.9–10.3)
Chloride: 102 mmol/L (ref 101–111)
Creatinine, Ser: 0.53 mg/dL (ref 0.44–1.00)
GFR calc Af Amer: >60 mL/min (ref >60)
GFR calc non Af Amer: >60 mL/min (ref >60)
Glucose, Bld: 103 mg/dL — ABNORMAL HIGH (ref 65–99)
Potassium: 3.6 mmol/L (ref 3.5–5.1)
Sodium: 134 mmol/L — ABNORMAL LOW (ref 135–145)
Total Bilirubin: 1.1 mg/dL (ref 0.3–1.2)
Total Protein: 7.3 g/dL (ref 6.5–8.1)

## 2016-01-22 LAB — URINALYSIS COMPLETE WITH MICROSCOPIC (ARMC ONLY)
Bacteria, UA: NONE SEEN
Bilirubin Urine: NEGATIVE
Glucose, UA: NEGATIVE mg/dL
Hgb urine dipstick: NEGATIVE
Leukocytes, UA: NEGATIVE
Nitrite: NEGATIVE
Protein, ur: 100 mg/dL — AB
Specific Gravity, Urine: 1.023 (ref 1.005–1.030)
pH: 5 (ref 5.0–8.0)

## 2016-01-22 LAB — CBC
HCT: 36.9 % (ref 35.0–47.0)
Hemoglobin: 12.7 g/dL (ref 12.0–16.0)
MCH: 30.7 pg (ref 26.0–34.0)
MCHC: 34.4 g/dL (ref 32.0–36.0)
MCV: 89.3 fL (ref 80.0–100.0)
Platelets: 205 10*3/uL (ref 150–440)
RBC: 4.13 MIL/uL (ref 3.80–5.20)
RDW: 12.9 % (ref 11.5–14.5)
WBC: 7.4 10*3/uL (ref 3.6–11.0)

## 2016-01-22 LAB — ABO/RH: ABO/RH(D): O POS

## 2016-01-22 NOTE — Discharge Instructions (Signed)

## 2016-01-22 NOTE — ED Notes (Addendum)
States RLQ abd pain, states is [redacted] weeks pregnant, denies any vaginal bleeding, states increased discharge white in color

## 2016-01-22 NOTE — ED Provider Notes (Signed)
[facility] Emergency Department Provider Note  Time seen: 2:36 PM  I have reviewed the triage vital signs and the nursing notes.   HISTORY  Chief Complaint Abdominal Pain and Vaginal Discharge    HPI Vanessa Franco is a 35 y.o. female G3 P0 approximately [redacted] weeks pregnant who presents the emergency department with lower abdominal cramping and nausea this morning. According to the patient this morning she awoke with nausea and dry heaved one or 2 times. She states she has been having some intermittent lower abdominal pain for the past 2 days which she describes as a cramping type pain. Denies any pain currently. Denies any fever, diarrhea, black or bloody stool. Denies any dysuria or hematuria. Patient is going to see [facility], first appointment pending. Patient was seen in the emergency department approximately 1 month ago and had an ultrasound showing a yolk sac with gestational sac at that time. Denies any vaginal bleeding. States a mild amount of discharge but saw her primary care doctor several days ago with a normal pelvic exam, she does not wish for another pelvic exam. Describes the cramping is mild to moderate when it comes, denies any currently. Patient states she is here because "I just want to make sure my baby is okay." Patient has had 2 prior miscarriages.     Past Medical History  Diagnosis Date  . History of uterine fibroid 11/2013    [facility]  . History of D&C   . Anxiety   . Depression   . Fibroid     There are no active problems to display for this patient.   Past Surgical History  Procedure Laterality Date  . Myomectomy    . Dilation and curettage of uterus      Current Outpatient Rx  Name  Route  Sig  Dispense  Refill  . Prenatal Vit-Fe Fumarate-FA (PRENATAL MULTIVITAMIN) TABS tablet   Oral   Take 1 tablet by mouth daily at 12 noon.           Allergies Toradol and Sulfa antibiotics  Family History  Problem Relation  Age of Onset  . Diabetes Father   . Kidney failure Father   . Heart failure Father   . Rheum arthritis Mother   . Hypertension Mother     Social History Social History  Substance Use Topics  . Smoking status: Current Some Day Smoker -- 0.00 packs/day  . Smokeless tobacco: Never Used  . Alcohol Use: No     Comment: occ    Review of Systems Constitutional: Negative for fever. Cardiovascular: Negative for chest pain. Respiratory: Negative for shortness of breath. Gastrointestinal: Intermittent lower abdominal pain. Negative diarrhea. Positive for nausea and vomiting. Genitourinary: Negative for dysuria. Musculoskeletal: Negative for back pain Neurological: Negative for headache 10-point ROS otherwise negative.  ____________________________________________   PHYSICAL EXAM:  VITAL SIGNS: ED Triage Vitals  Enc Vitals Group     BP 01/22/16 1223 131/94 mmHg     Pulse Rate 01/22/16 1223 109     Resp 01/22/16 1223 16     Temp 01/22/16 1223 98.7 F (37.1 C)     Temp Source 01/22/16 1223 Oral     SpO2 01/22/16 1223 100 %     Weight 01/22/16 1223 159 lb (72.122 kg)     Height 01/22/16 1223 5\' 7"  (1.702 m)     Head Cir --      Peak Flow --      Pain Score 01/22/16 1223  8     Pain Loc --      Pain Edu? --      Excl. in [place]? --     Constitutional: Alert and oriented. Well appearing and in no distress. Eyes: Normal exam ENT   Head: Normocephalic and atraumatic.   Mouth/Throat: Mucous membranes are moist. Cardiovascular: Normal rate, regular rhythm. No murmur Respiratory: Normal respiratory effort without tachypnea nor retractions. Breath sounds are clear  Gastrointestinal: Soft, mild suprapubic tenderness palpation without rebound or guarding. No distention. No CVA tenderness. Musculoskeletal: Nontender with normal range of motion in all extremities Neurologic:  Normal speech and language. No gross focal neurologic deficit Skin:  Skin is warm, dry and intact.   Psychiatric: Mood and affect are normal. ____________________________________________    INITIAL IMPRESSION / ASSESSMENT AND PLAN / ED COURSE  Pertinent labs & imaging results that were available during my care of the patient were reviewed by me and considered in my medical decision making (see chart for details).  Patient presents the emergency department with intermittent lower abdominal cramping, nausea this morning. Denies any cramping currently. States the nausea has resolved. Patient had 2 prior miscarriages and states she is here just to make sure that her baby is okay. She does state white vaginal discharge, but states this is unchanged for her pregnancy. She just had a pelvic exam performed several days ago and states she does not want a repeat pelvic exam performed at this time. Patient's labs including urinalysis are largely within normal limits, 1+ ketones. Bedside ultrasound shows intrauterine pregnancy with a heart rate of 167, good fetal movement, good amount of amniotic fluid. Patient is reassured by the ultrasound and states she'll follow-up with her OB/GYN.  ____________________________________________   FINAL CLINICAL IMPRESSION(S) / ED DIAGNOSES  Nausea and pregnancy Intermittent lower abdominal pain and pregnancy   Harvest Dark, MD 01/22/16 1441

## 2016-02-04 ENCOUNTER — Ambulatory Visit (INDEPENDENT_AMBULATORY_CARE_PROVIDER_SITE_OTHER): Payer: Medicaid Other | Admitting: Obstetrics and Gynecology

## 2016-02-04 ENCOUNTER — Encounter: Payer: Self-pay | Admitting: Obstetrics and Gynecology

## 2016-02-04 VITALS — BP 113/67 | HR 86 | Wt 160.0 lb

## 2016-02-04 DIAGNOSIS — O09521 Supervision of elderly multigravida, first trimester: Secondary | ICD-10-CM

## 2016-02-04 DIAGNOSIS — Z3481 Encounter for supervision of other normal pregnancy, first trimester: Secondary | ICD-10-CM | POA: Diagnosis not present

## 2016-02-04 DIAGNOSIS — O09529 Supervision of elderly multigravida, unspecified trimester: Secondary | ICD-10-CM

## 2016-02-04 DIAGNOSIS — Z720 Tobacco use: Secondary | ICD-10-CM | POA: Insufficient documentation

## 2016-02-04 LAB — POCT URINALYSIS DIPSTICK
Bilirubin, UA: NEGATIVE
Glucose, UA: NEGATIVE
Ketones, UA: NEGATIVE
Leukocytes, UA: NEGATIVE
Nitrite, UA: NEGATIVE
Protein, UA: NEGATIVE
Spec Grav, UA: 1.025
Urobilinogen, UA: NEGATIVE
pH, UA: 6

## 2016-02-04 NOTE — Progress Notes (Signed)
OBSTETRIC INITIAL PRENATAL VISIT  Subjective:    Vanessa Franco is being seen today for her first obstetrical visit.  This is not a planned pregnancy. She is a 35 y.o. G69P0020 female at [redacted]w[redacted]d gestation, Estimated Date of Delivery: 08/22/16 with Patient's last menstrual period was 11/16/2015 (consistent with 5 week sono). Her obstetrical history is significant for advanced maternal age and smoker. Relationship with FOB: significant other, not living together. Patient does intend to breast feed. Pregnancy history fully reviewed.    Obstetric History   G3   P0   T0   P0   A2   TAB0   SAB1   E0   M0   L0     # Outcome Date GA Lbr Len/2nd Weight Sex Delivery Anes PTL Lv  3 Current           2 AB 2002        FD  1 SAB 2001        FD      Gynecologic History:  Last pap smear was 2 years ago (2015).  Results were normal.  Denies h/o abnormal pap smears in the past.  Denies history of STIs.    Past Medical History  Diagnosis Date  . History of uterine fibroid 11/2013    [facility]  . History of D&C   . Anxiety   . Depression   . Fibroid     Family History  Problem Relation Age of Onset  . Diabetes Father   . Kidney failure Father   . Heart failure Father   . Rheum arthritis Mother   . Hypertension Mother     Past Surgical History  Procedure Laterality Date  . Myomectomy  2015    Hysteroscopic  . Dilation and curettage of uterus      Social History   Social History  . Marital Status: Single    Spouse Name: N/A  . Number of Children: N/A  . Years of Education: N/A   Occupational History  . sales [employer]   Social History Main Topics  . Smoking status: Current Some Day Smoker -- 0.00 packs/day  . Smokeless tobacco: Never Used  . Alcohol Use: No     Comment: occ  . Drug Use: Yes    Special: Marijuana     Comment: before she knew she was pregnant  . Sexual Activity:    Partners: Male    Patent examiner Protection: None   Other Topics Concern  .  Not on file   Social History Narrative    Current Outpatient Prescriptions on File Prior to Visit  Medication Sig Dispense Refill  . Prenatal Vit-Fe Fumarate-FA (PRENATAL MULTIVITAMIN) TABS tablet Take 1 tablet by mouth daily at 12 noon.     No current facility-administered medications on file prior to visit.    Allergies  Allergen Reactions  . Toradol [Ketorolac Tromethamine] Swelling  . Sulfa Antibiotics Rash    Review of Systems General:Not Present- Fever, Weight Loss and Weight Gain. Skin:Not Present- Rash. HEENT:Not Present- Blurred Vision, Headache and Bleeding Gums. Respiratory:Not Present- Difficulty Breathing. Breast:Not Present- Breast Mass. Cardiovascular:Not Present- Chest Pain, Elevated Blood Pressure, Fainting/Blacking Out and Shortness of Breath. Gastrointestinal:Not Present- Abdominal Pain, Constipation, Nausea and Vomiting. Female Genitourinary:Not Present- Frequency, Painful Urination, Pelvic Pain, Vaginal Bleeding, Vaginal Discharge, Contractions, regular, Fetal Movements Decreased, Urinary Complaints and Vaginal Fluid. Musculoskeletal:Not Present- Back Pain and Leg Cramps. Neurological:Not Present- Dizziness. Psychiatric:Not Present- Depression.     Objective:  Blood pressure 113/67, pulse 86, weight 160 lb (72.576 kg), last menstrual period 11/16/2015.  Body mass index is 25.05 kg/(m^2).  General Appearance:    Alert, cooperative, no distress, appears stated age  Head:    Normocephalic, without obvious abnormality, atraumatic  Eyes:    PERRL, conjunctiva/corneas clear, EOM's intact, both eyes  Ears:    Normal external ear canals, both ears  Nose:   Nares normal, septum midline, mucosa normal, no drainage or sinus tenderness  Throat:   Lips, mucosa, and tongue normal; teeth and gums normal  Neck:   Supple, symmetrical, trachea midline, no adenopathy; thyroid: no enlargement/tenderness/nodules; no carotid bruit or JVD  Back:     Symmetric, no  curvature, ROM normal, no CVA tenderness  Lungs:     Clear to auscultation bilaterally, respirations unlabored  Chest Wall:    No tenderness or deformity   Heart:    Regular rate and rhythm, S1 and S2 normal, no murmur, rub or gallop  Breast Exam:    No tenderness, masses, or nipple abnormality  Abdomen:     Soft, non-tender, bowel sounds active all four quadrants, no masses, no organomegaly.  FHT 169 bpm.  Genitalia:    Pelvic:external genitalia normal, vagina without lesions, discharge, or tenderness, rectovaginal septum  normal. Cervix normal in appearance, no cervical motion tenderness, no adnexal masses or tenderness.  Pregnancy positive findings: uterine enlargement: 11 wk size, nontender.   Rectal:    Normal external sphincter.  No hemorrhoids appreciated. Internal exam not done.   Extremities:   Extremities normal, atraumatic, no cyanosis or edema  Pulses:   2+ and symmetric all extremities  Skin:   Skin color, texture, turgor normal, no rashes or lesions  Lymph nodes:   Cervical, supraclavicular, and axillary nodes normal  Neurologic:   CNII-XII intact, normal strength, sensation and reflexes throughout     Assessment:   Pregnancy at 11 and 3/7 weeks   Tobacco abuse (currently smoking 10 cig currently) H/o marijuana abuse, currently in remission Advanced maternal age    Plan:    Initial labs reviewed. Prenatal vitamins encouraged. Problem list reviewed and updated. Discussed smoking cessation. Has been cutting down (previously smoking 1 ppd prior to onset of pregnancy).  Patient notes that she is ready to quit.  Discussed use of supplements such as nicotine patch, gum, medications.  Desires to use gum.  Will reassess at next visit.  H/o marijuana use, currently in remission. Advanced maternal age, will be age 76 y.o. at time of delivery.  Discussed slight increase genetic disorders (including DS).  New OB counseling:  The patient has been given an overview regarding routine  prenatal care.  Recommendations regarding diet, weight gain, and exercise in pregnancy were given. Prenatal testing, optional genetic testing, and ultrasound use in pregnancy were reviewed.  AFP3 discussed: ordered. Benefits of Breast Feeding were discussed. The patient is encouraged to consider nursing her baby post partum. Follow up in 4 weeks.  50% of 30 min visit spent on counseling and coordination of care.  Pregnancy Medicaid Program form completed today.     [name], MD [facility]

## 2016-02-10 ENCOUNTER — Other Ambulatory Visit: Payer: Self-pay | Admitting: Obstetrics and Gynecology

## 2016-02-10 DIAGNOSIS — Z3682 Encounter for antenatal screening for nuchal translucency: Secondary | ICD-10-CM

## 2016-02-12 ENCOUNTER — Other Ambulatory Visit: Payer: Self-pay | Admitting: Obstetrics and Gynecology

## 2016-02-12 ENCOUNTER — Ambulatory Visit (INDEPENDENT_AMBULATORY_CARE_PROVIDER_SITE_OTHER): Payer: Medicaid Other

## 2016-02-12 ENCOUNTER — Other Ambulatory Visit: Payer: Self-pay

## 2016-02-12 DIAGNOSIS — O283 Abnormal ultrasonic finding on antenatal screening of mother: Secondary | ICD-10-CM | POA: Diagnosis not present

## 2016-02-12 DIAGNOSIS — R399 Unspecified symptoms and signs involving the genitourinary system: Secondary | ICD-10-CM

## 2016-02-12 DIAGNOSIS — Z3682 Encounter for antenatal screening for nuchal translucency: Secondary | ICD-10-CM

## 2016-02-14 LAB — URINE CULTURE

## 2016-02-15 LAB — FIRST TRIMESTER SCREEN W/NT
CRL: 72.1 mm
DIA MoM: 1.44
DIA Value: 315 pg/mL
Gest Age-Collect: 13 weeks
Maternal Age At EDD: 35.1 years
Nuchal Translucency MoM: 1.11
Nuchal Translucency: 1.4 mm
Number of Fetuses: 1
PAPP-A MoM: 1.72
PAPP-A Value: 1811.9 ng/mL
Test Results:: NEGATIVE
Weight: 161 [lb_av]
hCG MoM: 1.18
hCG Value: 92.5 IU/mL

## 2016-02-19 ENCOUNTER — Telehealth: Payer: Self-pay

## 2016-02-19 DIAGNOSIS — N390 Urinary tract infection, site not specified: Secondary | ICD-10-CM

## 2016-02-19 DIAGNOSIS — B9620 Unspecified Escherichia coli [E. coli] as the cause of diseases classified elsewhere: Secondary | ICD-10-CM

## 2016-02-19 MED ORDER — NITROFURANTOIN MONOHYD MACRO 100 MG PO CAPS: 100.0000 mg | ORAL_CAPSULE | Freq: Two times a day (BID) | ORAL | Status: DC

## 2016-02-19 NOTE — Telephone Encounter (Signed)
-----   Message from [name], MD sent at 02/18/2016 10:41 PM EDT ----- Please inform of E. Coli UTI.  Needs treatment with Macrobid.

## 2016-02-19 NOTE — Telephone Encounter (Signed)
Called pt LM for her to call back, no answer. RX sent.

## 2016-02-20 ENCOUNTER — Ambulatory Visit: Payer: Medicaid Other | Admitting: Obstetrics and Gynecology

## 2016-02-20 NOTE — Telephone Encounter (Signed)
Pt calls back, was informed of of UTI and prescription.

## 2016-02-21 ENCOUNTER — Telehealth: Payer: Self-pay | Admitting: Obstetrics and Gynecology

## 2016-02-21 NOTE — Telephone Encounter (Signed)
Called pt she states that she her reviewing her NT scan results online and does not understand the results. Review pt's lab and NT, both are WNL. Pt advised of this. Pt also noted that she is having a lot of generalized anxiety about the pregnancy, is worried that the baby may "stop developing" pt states she is very anxious due to previous miscarriages. Pt given verbal reassurance and advised on what signs and sx to look for that may indicate miscarriage.

## 2016-02-21 NOTE — Telephone Encounter (Signed)
Ms. Nalley called asking for [place] to call her back so she can receive clarification regarding her most recent results.   Pt's ph# [phone] Thank you.

## 2016-03-02 ENCOUNTER — Telehealth: Payer: Self-pay | Admitting: Obstetrics and Gynecology

## 2016-03-02 NOTE — Telephone Encounter (Signed)
Patient called stating she is severely constipated. She said it has been this way since she was 14 weeks. She uses the [facility] on [address].Thanks

## 2016-03-03 MED ORDER — DOCUSATE SODIUM 100 MG PO CAPS: 100.0000 mg | ORAL_CAPSULE | Freq: Two times a day (BID) | ORAL | Status: DC

## 2016-03-03 NOTE — Telephone Encounter (Signed)
Called pt she states that she has been constipated for severely x 1 week. Advised pt to use Fleet enema to initially move bowels, increase fiber intake with fruits and veggies. Also to begin colace BID. RX sent.

## 2016-03-04 ENCOUNTER — Ambulatory Visit (INDEPENDENT_AMBULATORY_CARE_PROVIDER_SITE_OTHER): Payer: Medicaid Other | Admitting: Obstetrics and Gynecology

## 2016-03-04 VITALS — BP 110/69 | HR 94 | Wt 159.1 lb

## 2016-03-04 DIAGNOSIS — Z3482 Encounter for supervision of other normal pregnancy, second trimester: Secondary | ICD-10-CM

## 2016-03-04 DIAGNOSIS — O09522 Supervision of elderly multigravida, second trimester: Secondary | ICD-10-CM

## 2016-03-04 DIAGNOSIS — Z87440 Personal history of urinary (tract) infections: Secondary | ICD-10-CM

## 2016-03-04 LAB — POCT URINALYSIS DIPSTICK
Bilirubin, UA: NEGATIVE
Glucose, UA: NEGATIVE
Ketones, UA: NEGATIVE
Nitrite, UA: NEGATIVE
Protein, UA: NEGATIVE
Spec Grav, UA: 1.025
Urobilinogen, UA: NEGATIVE
pH, UA: 6

## 2016-03-04 NOTE — Progress Notes (Signed)
ROB: Notes job is making her work 16 hour shifts. Noted swelling of hands and feet last week after her shift.  Discussed job duties and expectations.  Can provide work letter in pregnancy if needed.  For TOC UCx for recent UTI. Normal 1st trimester screen. RTC in 4 weeks, for anatomy scan next visit.

## 2016-03-07 ENCOUNTER — Encounter: Payer: Self-pay | Admitting: Obstetrics and Gynecology

## 2016-03-07 DIAGNOSIS — O2341 Unspecified infection of urinary tract in pregnancy, first trimester: Secondary | ICD-10-CM | POA: Insufficient documentation

## 2016-03-19 ENCOUNTER — Encounter: Payer: Self-pay | Admitting: Emergency Medicine

## 2016-03-19 ENCOUNTER — Emergency Department
Admission: EM | Admit: 2016-03-19 | Discharge: 2016-03-19 | Disposition: A | Discharge status: Home / Self Care | Payer: Medicaid Other | Attending: Emergency Medicine | Admitting: Emergency Medicine

## 2016-03-19 DIAGNOSIS — O26899 Other specified pregnancy related conditions, unspecified trimester: Secondary | ICD-10-CM

## 2016-03-19 DIAGNOSIS — B9689 Other specified bacterial agents as the cause of diseases classified elsewhere: Secondary | ICD-10-CM

## 2016-03-19 DIAGNOSIS — R102 Pelvic and perineal pain: Secondary | ICD-10-CM | POA: Diagnosis not present

## 2016-03-19 DIAGNOSIS — Z3A17 17 weeks gestation of pregnancy: Secondary | ICD-10-CM | POA: Diagnosis not present

## 2016-03-19 DIAGNOSIS — F329 Major depressive disorder, single episode, unspecified: Secondary | ICD-10-CM | POA: Diagnosis not present

## 2016-03-19 DIAGNOSIS — F17200 Nicotine dependence, unspecified, uncomplicated: Secondary | ICD-10-CM | POA: Diagnosis not present

## 2016-03-19 DIAGNOSIS — N760 Acute vaginitis: Secondary | ICD-10-CM

## 2016-03-19 DIAGNOSIS — F1210 Cannabis abuse, uncomplicated: Secondary | ICD-10-CM | POA: Insufficient documentation

## 2016-03-19 DIAGNOSIS — O2392 Unspecified genitourinary tract infection in pregnancy, second trimester: Secondary | ICD-10-CM | POA: Diagnosis present

## 2016-03-19 DIAGNOSIS — B3731 Acute candidiasis of vulva and vagina: Secondary | ICD-10-CM

## 2016-03-19 DIAGNOSIS — R109 Unspecified abdominal pain: Secondary | ICD-10-CM

## 2016-03-19 DIAGNOSIS — B373 Candidiasis of vulva and vagina: Secondary | ICD-10-CM | POA: Diagnosis not present

## 2016-03-19 DIAGNOSIS — B379 Candidiasis, unspecified: Secondary | ICD-10-CM

## 2016-03-19 LAB — CHLAMYDIA/NGC RT PCR (ARMC ONLY)
Chlamydia Tr: NOT DETECTED
N gonorrhoeae: NOT DETECTED

## 2016-03-19 LAB — COMPREHENSIVE METABOLIC PANEL
ALT: 41 U/L (ref 14–54)
AST: 41 U/L (ref 15–41)
Albumin: 3.9 g/dL (ref 3.5–5.0)
Alkaline Phosphatase: 51 U/L (ref 38–126)
Anion gap: 7 (ref 5–15)
BUN: 8 mg/dL (ref 6–20)
CO2: 25 mmol/L (ref 22–32)
Calcium: 9.6 mg/dL (ref 8.9–10.3)
Chloride: 103 mmol/L (ref 101–111)
Creatinine, Ser: 0.41 mg/dL — ABNORMAL LOW (ref 0.44–1.00)
GFR calc Af Amer: >60 mL/min (ref >60)
GFR calc non Af Amer: >60 mL/min (ref >60)
Glucose, Bld: 64 mg/dL — ABNORMAL LOW (ref 65–99)
Potassium: 3.4 mmol/L — ABNORMAL LOW (ref 3.5–5.1)
Sodium: 135 mmol/L (ref 135–145)
Total Bilirubin: 0.7 mg/dL (ref 0.3–1.2)
Total Protein: 7.1 g/dL (ref 6.5–8.1)

## 2016-03-19 LAB — WET PREP, GENITAL
Sperm: NONE SEEN
Trich, Wet Prep: NONE SEEN

## 2016-03-19 LAB — URINALYSIS COMPLETE WITH MICROSCOPIC (ARMC ONLY)
Bilirubin Urine: NEGATIVE
Glucose, UA: NEGATIVE mg/dL
Hgb urine dipstick: NEGATIVE
Ketones, ur: NEGATIVE mg/dL
Leukocytes, UA: NEGATIVE
Nitrite: NEGATIVE
Protein, ur: NEGATIVE mg/dL
Specific Gravity, Urine: 1.011 (ref 1.005–1.030)
pH: 6 (ref 5.0–8.0)

## 2016-03-19 LAB — CBC
HCT: 32.8 % — ABNORMAL LOW (ref 35.0–47.0)
Hemoglobin: 11.3 g/dL — ABNORMAL LOW (ref 12.0–16.0)
MCH: 31.2 pg (ref 26.0–34.0)
MCHC: 34.6 g/dL (ref 32.0–36.0)
MCV: 90.1 fL (ref 80.0–100.0)
Platelets: 186 10*3/uL (ref 150–440)
RBC: 3.64 MIL/uL — ABNORMAL LOW (ref 3.80–5.20)
RDW: 13.4 % (ref 11.5–14.5)
WBC: 9.4 10*3/uL (ref 3.6–11.0)

## 2016-03-19 LAB — POCT PREGNANCY, URINE
Preg Test, Ur: POSITIVE — AB
Preg Test, Ur: POSITIVE — AB

## 2016-03-19 LAB — LIPASE, BLOOD: Lipase: 30 U/L (ref 11–51)

## 2016-03-19 LAB — HCG, QUANTITATIVE, PREGNANCY: hCG, Beta Chain, Quant, S: 36978 m[IU]/mL — ABNORMAL HIGH (ref <5)

## 2016-03-19 MED ORDER — CLOTRIMAZOLE 1 % VA CREA: 1.0000 | TOPICAL_CREAM | Freq: Every day | VAGINAL | Status: DC

## 2016-03-19 MED ORDER — METRONIDAZOLE 500 MG PO TABS: 500.0000 mg | ORAL_TABLET | Freq: Two times a day (BID) | ORAL | Status: AC

## 2016-03-19 NOTE — ED Provider Notes (Signed)
[facility] Emergency Department Provider Note  Time seen: 8:28 PM  I have reviewed the triage vital signs and the nursing notes.   HISTORY  Chief Complaint Abdominal Pain and Back Pain    HPI Vanessa Franco is a 35 y.o. female with a past medical history of depression, anxiety, G3 A2 who presents to the emergency department [redacted] weeks pregnant with lower abdominal discomfort and lower back pain. According to the patient for several days she has been having lower back pain, states she has to roll out of bed due to the pain in her back. Today she has been expressing lower abdominal cramping which she describes as mild to moderate. Denies any vaginal bleeding. Does state white discharge. She has a history of yeast infection the past which she feels this is similar. Patient denies any nausea, vomiting, diarrhea, dysuria, fever.     Past Medical History  Diagnosis Date  . History of uterine fibroid 11/2013    [facility]  . History of D&C   . Anxiety   . Depression   . Fibroid     Patient Active Problem List   Diagnosis Date Noted  . UTI (urinary tract infection) in pregnancy in first trimester 03/07/2016  . Tobacco abuse 02/04/2016  . Advanced maternal age in multigravida 02/04/2016    Past Surgical History  Procedure Laterality Date  . Myomectomy  2015    Hysteroscopic  . Dilation and curettage of uterus      Current Outpatient Rx  Name  Route  Sig  Dispense  Refill  . docusate sodium (COLACE) 100 MG capsule   Oral   Take 1 capsule (100 mg total) by mouth 2 (two) times daily.   60 capsule   6   . Prenatal Vit-Fe Fumarate-FA (PRENATAL MULTIVITAMIN) TABS tablet   Oral   Take 1 tablet by mouth daily at 12 noon.           Allergies Toradol and Sulfa antibiotics  Family History  Problem Relation Age of Onset  . Diabetes Father   . Kidney failure Father   . Heart failure Father   . Rheum arthritis Mother   . Hypertension Mother     Social  History Social History  Substance Use Topics  . Smoking status: Current Some Day Smoker -- 0.00 packs/day  . Smokeless tobacco: Never Used  . Alcohol Use: No     Comment: rare    Review of Systems Constitutional: Negative for fever. Cardiovascular: Negative for chest pain. Respiratory: Negative for shortness of breath. Gastrointestinal: Mild lower abdominal cramping. Denies nausea, vomiting, diarrhea. Genitourinary: Negative for dysuria. Negative for vaginal bleeding. Musculoskeletal: Moderate lower back pain Neurological: Negative for headache 10-point ROS otherwise negative.  ____________________________________________   PHYSICAL EXAM:  VITAL SIGNS: ED Triage Vitals  Enc Vitals Group     BP 03/19/16 1852 125/68 mmHg     Pulse Rate 03/19/16 1852 87     Resp 03/19/16 1852 18     Temp 03/19/16 1852 98.3 F (36.8 C)     Temp Source 03/19/16 1852 Oral     SpO2 03/19/16 1852 100 %     Weight 03/19/16 1852 160 lb (72.576 kg)     Height 03/19/16 1852 5\' 7"  (1.702 m)     Head Cir --      Peak Flow --      Pain Score 03/19/16 1846 10     Pain Loc --      Pain  Edu? --      Excl. in [place]? --     Constitutional: Alert and oriented. Well appearing and in no distress. Eyes: Normal exam ENT   Head: Normocephalic and atraumatic.   Mouth/Throat: Mucous membranes are moist. Cardiovascular: Normal rate, regular rhythm. No murmur Respiratory: Normal respiratory effort without tachypnea nor retractions. Breath sounds are clear  Gastrointestinal: Soft, mild suprapubic tenderness palpation. No rebound or guarding. No distention. Musculoskeletal: Nontender with normal range of motion in all extremities. Mild lumbar tenderness to palpation, as well as paraspinal tenderness palpation. Neurologic:  Normal speech and language. No gross focal neurologic deficits Skin:  Skin is warm, dry and intact.  Psychiatric: Mood and affect are  normal ____________________________________________     INITIAL IMPRESSION / ASSESSMENT AND PLAN / ED COURSE  Pertinent labs & imaging results that were available during my care of the patient were reviewed by me and considered in my medical decision making (see chart for details).  Patient presents the emergency department lower back pain and lower abdominal pain. States she has been constipated taking Colace. Patient's labs are within normal limits, urinalysis negative. Given vaginal discharge will perform a pelvic examination as well as bedside ultrasound to further evaluate. Patient agreeable to this plan. Overall the patient appears very well, no distress.  Pelvic exam is nontender, mild white discharge.  With Positive for BV and yeast infection. We'll prescribe topical antifungal, 7 days of Flagyl twice a day. Patient will be discharged home with OB follow-up. ____________________________________________   FINAL CLINICAL IMPRESSION(S) / ED DIAGNOSES  Lower abdominal pain during pregnancy Yeast infection Bacterial vaginitis   Harvest Dark, MD 03/19/16 2152

## 2016-03-19 NOTE — Discharge Instructions (Signed)
Bacterial Vaginosis Bacterial vaginosis is a vaginal infection that occurs when the normal balance of bacteria in the vagina is disrupted. It results from an overgrowth of certain bacteria. This is the most common vaginal infection in women of childbearing age. Treatment is important to prevent complications, especially in pregnant women, as it can cause a premature delivery. CAUSES  Bacterial vaginosis is caused by an increase in harmful bacteria that are normally present in smaller amounts in the vagina. Several different kinds of bacteria can cause bacterial vaginosis. However, the reason that the condition develops is not fully understood. RISK FACTORS Certain activities or behaviors can put you at an increased risk of developing bacterial vaginosis, including:  Having a new sex partner or multiple sex partners.  Douching.  Using an intrauterine device (IUD) for contraception. Women do not get bacterial vaginosis from toilet seats, bedding, swimming pools, or contact with objects around them. SIGNS AND SYMPTOMS  Some women with bacterial vaginosis have no signs or symptoms. Common symptoms include:  Grey vaginal discharge.  A fishlike odor with discharge, especially after sexual intercourse.  Itching or burning of the vagina and vulva.  Burning or pain with urination. DIAGNOSIS  Your health care provider will take a medical history and examine the vagina for signs of bacterial vaginosis. A sample of vaginal fluid may be taken. Your health care provider will look at this sample under a microscope to check for bacteria and abnormal cells. A vaginal pH test may also be done.  TREATMENT  Bacterial vaginosis may be treated with antibiotic medicines. These may be given in the form of a pill or a vaginal cream. A second round of antibiotics may be prescribed if the condition comes back after treatment. Because bacterial vaginosis increases your risk for sexually transmitted diseases, getting  treated can help reduce your risk for chlamydia, gonorrhea, HIV, and herpes. HOME CARE INSTRUCTIONS   Only take over-the-counter or prescription medicines as directed by your health care provider.  If antibiotic medicine was prescribed, take it as directed. Make sure you finish it even if you start to feel better.  Tell all sexual partners that you have a vaginal infection. They should see their health care provider and be treated if they have problems, such as a mild rash or itching.  During treatment, it is important that you follow these instructions:  Avoid sexual activity or use condoms correctly.  Do not douche.  Avoid alcohol as directed by your health care provider.  Avoid breastfeeding as directed by your health care provider. SEEK MEDICAL CARE IF:   Your symptoms are not improving after 3 days of treatment.  You have increased discharge or pain.  You have a fever. MAKE SURE YOU:   Understand these instructions.  Will watch your condition.  Will get help right away if you are not doing well or get worse. FOR MORE INFORMATION  Centers for Disease Control and Prevention, Division of STD Prevention: www.cdc.gov/std American Sexual Health Association (ASHA): www.ashastd.org    This information is not intended to replace advice given to you by your health care provider. Make sure you discuss any questions you have with your health care provider.   Document Released: 09/07/2005 Document Revised: 09/28/2014 Document Reviewed: 04/19/2013 Elsevier Interactive Patient Education 2016 Elsevier Inc. Monilial Vaginitis Vaginitis in a soreness, swelling and redness (inflammation) of the vagina and vulva. Monilial vaginitis is not a sexually transmitted infection. CAUSES  Yeast vaginitis is caused by yeast (candida) that is normally found in   your vagina. With a yeast infection, the candida has overgrown in number to a point that upsets the chemical balance. SYMPTOMS   White,  thick vaginal discharge.  Swelling, itching, redness and irritation of the vagina and possibly the lips of the vagina (vulva).  Burning or painful urination.  Painful intercourse. DIAGNOSIS  Things that may contribute to monilial vaginitis are:  Postmenopausal and virginal states.  Pregnancy.  Infections.  Being tired, sick or stressed, especially if you had monilial vaginitis in the past.  Diabetes. Good control will help lower the chance.  Birth control pills.  Tight fitting garments.  Using bubble bath, feminine sprays, douches or deodorant tampons.  Taking certain medications that kill germs (antibiotics).  Sporadic recurrence can occur if you become ill. TREATMENT  Your caregiver will give you medication.  There are several kinds of anti monilial vaginal creams and suppositories specific for monilial vaginitis. For recurrent yeast infections, use a suppository or cream in the vagina 2 times a week, or as directed.  Anti-monilial or steroid cream for the itching or irritation of the vulva may also be used. Get your caregiver's permission.  Painting the vagina with methylene blue solution may help if the monilial cream does not work.  Eating yogurt may help prevent monilial vaginitis. HOME CARE INSTRUCTIONS   Finish all medication as prescribed.  Do not have sex until treatment is completed or after your caregiver tells you it is okay.  Take warm sitz baths.  Do not douche.  Do not use tampons, especially scented ones.  Wear cotton underwear.  Avoid tight pants and panty hose.  Tell your sexual partner that you have a yeast infection. They should go to their caregiver if they have symptoms such as mild rash or itching.  Your sexual partner should be treated as well if your infection is difficult to eliminate.  Practice safer sex. Use condoms.  Some vaginal medications cause latex condoms to fail. Vaginal medications that harm condoms are:  Cleocin  cream.  Butoconazole (Femstat).  Terconazole (Terazol) vaginal suppository.  Miconazole (Monistat) (may be purchased over the counter). SEEK MEDICAL CARE IF:   You have a temperature by mouth above 102 F (38.9 C).  The infection is getting worse after 2 days of treatment.  The infection is not getting better after 3 days of treatment.  You develop blisters in or around your vagina.  You develop vaginal bleeding, and it is not your menstrual period.  You have pain when you urinate.  You develop intestinal problems.  You have pain with sexual intercourse.   This information is not intended to replace advice given to you by your health care provider. Make sure you discuss any questions you have with your health care provider.   Document Released: 06/17/2005 Document Revised: 11/30/2011 Document Reviewed: 03/11/2015 Elsevier Interactive Patient Education 2016 Elsevier Inc.  

## 2016-03-19 NOTE — ED Notes (Signed)
Pt reports she is [redacted] weeks pregnant. Pt c/o lower left abdominal pain, and lower right back pain both described as sharp and cramping. Pt reports she had 'hot flashes' with onset of pain, and bilateral upper leg 'tingling'.

## 2016-03-19 NOTE — ED Notes (Addendum)
Pt states she is [redacted] weeks pregnant, G2A1. Pt states at 3pm today she had sudden onset lower abdominal pain and pain back. Pt states abdominal pain was constant for approx 10 mins and her back pain has been constant since 3pm. Pt denies bleeding and a "dry discharge". Pt presents in NAD. Skin warm, dry, and intact, respirations even and unlabored.

## 2016-03-19 NOTE — ED Notes (Signed)
Reviewed d/c instructions, follow-up care, prescriptions, and OTC Tylenol with pt. Pt verbalized understanding

## 2016-03-26 ENCOUNTER — Telehealth: Payer: Self-pay | Admitting: Obstetrics and Gynecology

## 2016-03-26 NOTE — Telephone Encounter (Signed)
[redacted]wk pregnant, wants to know if she can move her appt up she is having a lot of pressure./ pt went to ER last wed or Thursday and they told her she had a yeast inf

## 2016-03-27 NOTE — Telephone Encounter (Signed)
Pt seen in ER on 03/19/2016 for lower abdominal pain and back pain, constipation, and vaginal discharge with dx of BV and yeast infection, started her treatment. No UTI per ER results. Pt states she is under a lot of stress, wants to make sure baby is ok. Advised to complete treatment and contact office on Monday for an appt if she continues with abdominal pain. Denies any vaginal bleeding.  If severe abdominal pain as in bending over pain or vaginal bleeding go to ER. Pt appreciated call and understood.

## 2016-04-08 ENCOUNTER — Ambulatory Visit (INDEPENDENT_AMBULATORY_CARE_PROVIDER_SITE_OTHER): Payer: Medicaid Other

## 2016-04-08 ENCOUNTER — Ambulatory Visit (INDEPENDENT_AMBULATORY_CARE_PROVIDER_SITE_OTHER): Payer: Medicaid Other | Admitting: Obstetrics and Gynecology

## 2016-04-08 VITALS — BP 108/70 | HR 87 | Wt 165.9 lb

## 2016-04-08 DIAGNOSIS — Z3482 Encounter for supervision of other normal pregnancy, second trimester: Secondary | ICD-10-CM

## 2016-04-08 DIAGNOSIS — O09522 Supervision of elderly multigravida, second trimester: Secondary | ICD-10-CM

## 2016-04-08 DIAGNOSIS — N949 Unspecified condition associated with female genital organs and menstrual cycle: Secondary | ICD-10-CM

## 2016-04-08 DIAGNOSIS — Z3492 Encounter for supervision of normal pregnancy, unspecified, second trimester: Secondary | ICD-10-CM

## 2016-04-08 NOTE — Progress Notes (Signed)
ROB: Patient doing well, notes some round ligament pain.  Advised on Tylenol, warm baths. S/p normal anatomy scan today. RTC in 4 weeks.

## 2016-04-27 ENCOUNTER — Telehealth: Payer: Self-pay | Admitting: Obstetrics and Gynecology

## 2016-04-27 NOTE — Telephone Encounter (Signed)
Pt called and she is [redacted] weeks pregnant, and she has an appt, and she is retaining fluid in her feet really bad and she didn't know if she needed to come in sooner then next week.

## 2016-04-27 NOTE — Telephone Encounter (Signed)
Called pt, no answer, LM to call back

## 2016-04-28 NOTE — Telephone Encounter (Signed)
Pt calls and states she is having swelling in her feet and hands after work. Advised pt on the use of compression stockings and adequate hydration also cut back on high sodium foods. Pt gave verbal understanding. To call back if no improvement.

## 2016-05-03 ENCOUNTER — Encounter: Payer: Self-pay | Admitting: *Deleted

## 2016-05-03 ENCOUNTER — Observation Stay
Admission: EM | Admit: 2016-05-03 | Discharge: 2016-05-03 | Disposition: A | Discharge status: Home / Self Care | Payer: Medicaid Other | Attending: Obstetrics and Gynecology | Admitting: Obstetrics and Gynecology

## 2016-05-03 DIAGNOSIS — O368121 Decreased fetal movements, second trimester, fetus 1: Secondary | ICD-10-CM | POA: Diagnosis not present

## 2016-05-03 DIAGNOSIS — O368120 Decreased fetal movements, second trimester, not applicable or unspecified: Principal | ICD-10-CM | POA: Insufficient documentation

## 2016-05-03 DIAGNOSIS — Z3A24 24 weeks gestation of pregnancy: Secondary | ICD-10-CM | POA: Diagnosis not present

## 2016-05-03 NOTE — Discharge Instructions (Signed)
Please call your provider if you have any questions or concerns.  You may also call the L&D nurse's desk at [facility] [phone] if you have any questions.  Keep appointment on Wednesday 05/06/16 at 3:30 with Dr. Marcelline Mates.  If you have urgent concerns you may come to the Emergency Department for care.

## 2016-05-04 NOTE — Final Progress Note (Signed)
L&D OB Triage Note  Vanessa Franco is a 35 y.o. G67P0020 female at [redacted]w[redacted]d, EDD Estimated Date of Delivery: 08/22/16 who presented to triage for complaints of decreased fetal movement.  She was evaluated by the nurses with no significant findings. Vital signs stable. An NST was performed and has been reviewed by MD.    NST INTERPRETATION: Indications: decreased fetal movement  Mode: External Baseline Rate (A): 145 bpm (fhr at this time) Variability: Moderate Accelerations: 10 x 10 Decelerations: None     Contraction Frequency (min): ui noted  Impression: reactive   Plan: NST performed was reviewed and was found to be reactive. Patient was instructed that fetal movement becomes more detectable consistently after ~ 28 weeks. She was discharged home.  Continue routine prenatal care. Follow up with OB/GYN as previously scheduled.     [name], MD [facility]

## 2016-05-06 ENCOUNTER — Ambulatory Visit (INDEPENDENT_AMBULATORY_CARE_PROVIDER_SITE_OTHER): Payer: Medicaid Other | Admitting: Obstetrics and Gynecology

## 2016-05-06 ENCOUNTER — Encounter: Payer: Self-pay | Admitting: Obstetrics and Gynecology

## 2016-05-06 VITALS — BP 112/70 | HR 75 | Wt 176.9 lb

## 2016-05-06 DIAGNOSIS — O09522 Supervision of elderly multigravida, second trimester: Secondary | ICD-10-CM

## 2016-05-06 DIAGNOSIS — Z3492 Encounter for supervision of normal pregnancy, unspecified, second trimester: Secondary | ICD-10-CM

## 2016-05-06 DIAGNOSIS — Z3482 Encounter for supervision of other normal pregnancy, second trimester: Secondary | ICD-10-CM

## 2016-05-06 LAB — POCT URINALYSIS DIPSTICK
Bilirubin, UA: NEGATIVE
Blood, UA: NEGATIVE
Glucose, UA: NEGATIVE
Ketones, UA: NEGATIVE
Leukocytes, UA: NEGATIVE
Nitrite, UA: NEGATIVE
Protein, UA: NEGATIVE
Spec Grav, UA: 1.025
Urobilinogen, UA: 0.2
pH, UA: 5

## 2016-05-06 NOTE — Progress Notes (Signed)
ROB

## 2016-05-06 NOTE — Progress Notes (Signed)
ROB: Patient notes complaints of bilateral feet swelling.  Is using compression stockings and elevating feet. Can discontinue Boost as she has gained 11 lbs since last visit. RTC in 4 weeks. For 28 week labs at that time.

## 2016-05-19 ENCOUNTER — Telehealth: Payer: Self-pay

## 2016-05-19 ENCOUNTER — Ambulatory Visit (INDEPENDENT_AMBULATORY_CARE_PROVIDER_SITE_OTHER): Payer: Medicaid Other | Admitting: Obstetrics and Gynecology

## 2016-05-19 VITALS — BP 120/72 | HR 79 | Wt 180.3 lb

## 2016-05-19 DIAGNOSIS — M25473 Effusion, unspecified ankle: Secondary | ICD-10-CM | POA: Diagnosis not present

## 2016-05-19 DIAGNOSIS — O26899 Other specified pregnancy related conditions, unspecified trimester: Secondary | ICD-10-CM | POA: Diagnosis not present

## 2016-05-19 DIAGNOSIS — Z3492 Encounter for supervision of normal pregnancy, unspecified, second trimester: Secondary | ICD-10-CM

## 2016-05-19 DIAGNOSIS — G5600 Carpal tunnel syndrome, unspecified upper limb: Secondary | ICD-10-CM | POA: Diagnosis not present

## 2016-05-19 DIAGNOSIS — M7989 Other specified soft tissue disorders: Secondary | ICD-10-CM | POA: Diagnosis not present

## 2016-05-19 LAB — POCT URINALYSIS DIPSTICK
Bilirubin, UA: NEGATIVE
Blood, UA: NEGATIVE
Glucose, UA: NEGATIVE
Ketones, UA: NEGATIVE
Leukocytes, UA: NEGATIVE
Nitrite, UA: NEGATIVE
Protein, UA: NEGATIVE
Spec Grav, UA: 1.01
Urobilinogen, UA: NEGATIVE
pH, UA: 7

## 2016-05-19 NOTE — Progress Notes (Signed)
OB w/i- bilateral ankles swelling. Uses compression hose. NO h/a. Dizzy at times. Numbness in in rt hand.   PROBLEM OB VISIT: 1. Bilateral ankle swelling 2. Right hand swelling with numbness and tingling  Patient is at [redacted] weeks gestation. Fetus is active. No regular contractions. Bilateral ankle edema is a problem this pregnancy. New symptom is right hand swelling associated with numbness and tingling.  OBJECTIVE: BP 120/72   Pulse 79   Wt 180 lb 4.8 oz (81.8 kg)   LMP 11/16/2015 (Exact Date)   BMI 28.24 kg/m  Pleasant gravid female in no acute distress. Alert and oriented. Abdomen: Fundal height 20 cm; fetal heart rate 148 Extremities: Bilateral pitting ankle edema; trace pretibial edema; right hand tender to palpation without obvious swelling.  Urine protein-negative  ASSESSMENT: 1. 26 week IUP 2. Probable carpal tunnel syndrome-right hand 3. Bilateral ankle edema 4. No evidence of preeclampsia  PLAN: 1. Carpal tunnel splints recommended 2. Continue with support hose to minimize edema 3. Elevate legs twice a day for 30 minutes above heart level IV. Return in 2 weeks for regular OB appointment with Dr. Marcelline Mates  A total of 15 minutes were spent face-to-face with the patient during this encounter and over half of that time dealt with counseling and coordination of care.  Brayton Mars, MD  Note: This dictation was prepared with Dragon dictation along with smaller phrase technology. Any transcriptional errors that result from this process are unintentional.

## 2016-05-19 NOTE — Patient Instructions (Signed)
1. Recommend continue using support is to limit lower extremity swelling 2. Elevate legs above heart level twice a day for 30 minutes 3. Recommend going to the pharmacy to get carpal tunnel, cock up splints

## 2016-05-19 NOTE — Telephone Encounter (Signed)
Pt calls and states that she is continuing to have bilateral leg swelling dispite use of ted hose and elevation. Also notes that since Sunday she has had hand swelling, RT hand worse than left. RT hand also has tingling and numbness. Pt also notes swelling in her face. Advised pt of the need to be seen. Pt to come in today.

## 2016-05-23 ENCOUNTER — Encounter: Payer: Self-pay | Admitting: Emergency Medicine

## 2016-05-23 ENCOUNTER — Emergency Department
Admission: EM | Admit: 2016-05-23 | Discharge: 2016-05-23 | Disposition: A | Discharge status: Home / Self Care | Payer: Medicaid Other | Attending: Emergency Medicine | Admitting: Emergency Medicine

## 2016-05-23 DIAGNOSIS — O99332 Smoking (tobacco) complicating pregnancy, second trimester: Secondary | ICD-10-CM | POA: Diagnosis not present

## 2016-05-23 DIAGNOSIS — O9989 Other specified diseases and conditions complicating pregnancy, childbirth and the puerperium: Secondary | ICD-10-CM | POA: Insufficient documentation

## 2016-05-23 DIAGNOSIS — Z3A26 26 weeks gestation of pregnancy: Secondary | ICD-10-CM | POA: Insufficient documentation

## 2016-05-23 DIAGNOSIS — M65831 Other synovitis and tenosynovitis, right forearm: Secondary | ICD-10-CM | POA: Insufficient documentation

## 2016-05-23 DIAGNOSIS — F17200 Nicotine dependence, unspecified, uncomplicated: Secondary | ICD-10-CM | POA: Insufficient documentation

## 2016-05-23 DIAGNOSIS — M659 Synovitis and tenosynovitis, unspecified: Secondary | ICD-10-CM

## 2016-05-23 NOTE — Discharge Instructions (Signed)
Follow-up with your OB/GYN if symptoms persist. Continue taking Tylenol as needed for pain sparingly. Ice and elevate to see if this gives any relief. Wear splint for support and restriction of wrist movements.

## 2016-05-23 NOTE — ED Notes (Signed)
Pt comes in to ED w/ c/o R. Hand pain that radiates towards elbow x3 days.  Per pt numbness/tingling in hand is intermittent, states it is hard to use affected hand. No OTC medicines used as pt is pregnant-hot/cold interventions used at home w/ no relief. Denies injury. NAD noted.

## 2016-05-23 NOTE — ED Provider Notes (Signed)
[facility] Emergency Department Provider Note  ____________________________________________   First MD Initiated Contact with Patient 05/23/16 1039     (approximate)  I have reviewed the triage vital signs and the nursing notes.   HISTORY  Chief Complaint Hand Pain   HPI Vanessa Franco is a 35 y.o. female is here complaint of right hand and forearm pain for 23 days. Patient denies any history of injury. Patient has been taking occasional Tylenol without any relief.Patient denies any previous injury to her hand. She has occasional numbness and tingling in her hand involving her thumb the most. Patient currently is approximately [redacted] weeks pregnant. Patient is right-hand dominant and currently rates her pain as 10 over 10.   Past Medical History:  Diagnosis Date  . Anxiety   . Depression   . Fibroid   . History of D&C   . History of uterine fibroid 11/2013   [facility]    Patient Active Problem List   Diagnosis Date Noted  . UTI (urinary tract infection) in pregnancy in first trimester 03/07/2016  . Tobacco abuse 02/04/2016  . Advanced maternal age in multigravida 02/04/2016    Past Surgical History:  Procedure Laterality Date  . DILATION AND CURETTAGE OF UTERUS    . MYOMECTOMY  2015   Hysteroscopic    Prior to Admission medications   Medication Sig Start Date End Date Taking? Authorizing Provider  omega-3 acid ethyl esters (LOVAZA) 1 g capsule Take by mouth 2 (two) times daily.    Historical Provider, MD  Prenatal Vit-Fe Fumarate-FA (PRENATAL MULTIVITAMIN) TABS tablet Take 1 tablet by mouth daily at 12 noon.    Historical Provider, MD    Allergies Ketorolac; Toradol [ketorolac tromethamine]; and Sulfa antibiotics  Family History  Problem Relation Age of Onset  . Diabetes Father   . Kidney failure Father   . Heart failure Father   . Rheum arthritis Mother   . Hypertension Mother     Social History Social History  Substance Use Topics    . Smoking status: Current Some Day Smoker    Packs/day: 0.25  . Smokeless tobacco: Never Used  . Alcohol use No     Comment: rare    Review of Systems Constitutional: No fever/chills Cardiovascular: Denies chest pain. Respiratory: Denies shortness of breath. Gastrointestinal:   No nausea, no vomiting.   Musculoskeletal: Positive right forearm and wrist pain. Neurological: Negative for headaches, focal weakness or numbness.  10-point ROS otherwise negative.  ____________________________________________   PHYSICAL EXAM:  VITAL SIGNS: ED Triage Vitals [05/23/16 1028]  Enc Vitals Group     BP 103/71     Pulse Rate 90     Resp      Temp 99 F (37.2 C)     Temp Source Oral     SpO2 100 %     Weight 180 lb (81.6 kg)     Height 5\' 7"  (1.702 m)     Head Circumference      Peak Flow      Pain Score 10     Pain Loc      Pain Edu?      Excl. in [place]?     Constitutional: Alert and oriented. Well appearing and in no acute distress. Eyes: Conjunctivae are normal. PERRL. EOMI. Head: Atraumatic. Nose: No congestion/rhinnorhea. Neck: No stridor.   Cardiovascular: Normal rate, regular rhythm. Grossly normal heart sounds.  Good peripheral circulation. Respiratory: Normal respiratory effort.  No retractions. Lungs CTAB. Musculoskeletal:  On examination of the right forearm and wrist there is no gross deformity. There is tenderness on palpation of the lateral epicondyles that reproduces partial discomfort. Patient is able to flex and extend her wrist but notices on extension there is increased pain. With abduction of her thumb patient has some increased pain. Patient is able to move all digits without any difficulty. Motor sensory function intact. Capillary refill less than 3 seconds. Neurologic:  Normal speech and language. No gross focal neurologic deficits are appreciated. No gait instability. Skin:  Skin is warm, dry and intact. No rash noted. No abrasions, erythema, ecchymosis is  noted. Psychiatric: Mood and affect are normal. Speech and behavior are normal.  ____________________________________________   LABS (all labs ordered are listed, but only abnormal results are displayed)  Labs Reviewed - No data to display   PROCEDURES  Procedure(s) performed: None  Procedures  Critical Care performed: No  ____________________________________________   INITIAL IMPRESSION / ASSESSMENT AND PLAN / ED COURSE  Pertinent labs & imaging results that were available during my care of the patient were reviewed by me and considered in my medical decision making (see chart for details).    Clinical Course  Pain is reproducible with range of motion and point tenderness on the lateral epicondyles as well as abduction of the right from. Patient agrees to wear a cockup wrist splint and take Tylenol and sparing amounts. She will follow-up with in [facility] about any further medication if needed.   ____________________________________________   FINAL CLINICAL IMPRESSION(S) / ED DIAGNOSES  Final diagnoses:  Tenosynovitis of right forearm      NEW MEDICATIONS STARTED DURING THIS VISIT:  New Prescriptions   No medications on file     Note:  This document was prepared using Dragon voice recognition software and may include unintentional dictation errors.    Johnn Hai, PA-C 05/23/16 1111    Delman Kitten, MD 05/23/16 1535

## 2016-05-23 NOTE — ED Triage Notes (Signed)
Right hand pain x 3 days.

## 2016-05-26 ENCOUNTER — Telehealth: Payer: Self-pay | Admitting: Obstetrics and Gynecology

## 2016-05-26 DIAGNOSIS — J0100 Acute maxillary sinusitis, unspecified: Secondary | ICD-10-CM

## 2016-05-26 NOTE — Telephone Encounter (Signed)
Patient called with complaints of a cold. She is [redacted] weeks pregnant and has a couple questions. Thanks

## 2016-05-27 MED ORDER — PHENYLEPHRINE HCL 10 MG PO TABS: 10.0000 mg | ORAL_TABLET | ORAL | 0 refills | Status: DC | PRN

## 2016-05-27 MED ORDER — AZITHROMYCIN 250 MG PO TABS: ORAL_TABLET | ORAL | 0 refills | Status: DC

## 2016-05-27 NOTE — Telephone Encounter (Signed)
Called pt, states she is having signs and sx of a sinus infection. Thick green mucus, tender face, x 3 days. RX sent in, [place] message sent

## 2016-05-29 ENCOUNTER — Other Ambulatory Visit: Payer: Medicaid Other

## 2016-05-29 DIAGNOSIS — Z131 Encounter for screening for diabetes mellitus: Secondary | ICD-10-CM

## 2016-05-29 DIAGNOSIS — Z130 Encounter for screening for diseases of the blood and blood-forming organs and certain disorders involving the immune mechanism: Secondary | ICD-10-CM

## 2016-05-30 LAB — GLUCOSE, 1 HOUR GESTATIONAL: Gestational Diabetes Screen: 164 mg/dL — ABNORMAL HIGH (ref 65–139)

## 2016-05-30 LAB — HEMOGLOBIN AND HEMATOCRIT, BLOOD
Hematocrit: 34.6 % (ref 34.0–46.6)
Hemoglobin: 11.8 g/dL (ref 11.1–15.9)

## 2016-06-01 ENCOUNTER — Other Ambulatory Visit: Payer: Self-pay

## 2016-06-01 ENCOUNTER — Telehealth: Payer: Self-pay | Admitting: Obstetrics and Gynecology

## 2016-06-01 ENCOUNTER — Telehealth: Payer: Self-pay

## 2016-06-01 DIAGNOSIS — R7309 Other abnormal glucose: Secondary | ICD-10-CM

## 2016-06-01 NOTE — Telephone Encounter (Signed)
Pt calls and questions results she received via mychart. Advised pt after review of her labs that her 1hr gtt was elevated and that she needs a 3hr gtt. Pt gave verbal understanding, order placed and pt added to lab schedule.

## 2016-06-01 NOTE — Telephone Encounter (Signed)
Called pt she states that after finishing course of antibiotics she feels no improvement or relief. Advised pt of the need to be seen. Pt added to [facility] schedule for tomorrow at 9:15am.

## 2016-06-01 NOTE — Telephone Encounter (Signed)
Vanessa Franco took all her antibiotics and she doesn't feel any better. She she was supposed to call if she didn't feel better

## 2016-06-02 ENCOUNTER — Ambulatory Visit (INDEPENDENT_AMBULATORY_CARE_PROVIDER_SITE_OTHER): Payer: Medicaid Other | Admitting: Obstetrics and Gynecology

## 2016-06-02 VITALS — BP 118/80 | HR 93 | Temp 98.7°F | Wt 183.2 lb

## 2016-06-02 DIAGNOSIS — J0191 Acute recurrent sinusitis, unspecified: Secondary | ICD-10-CM

## 2016-06-02 DIAGNOSIS — Z3493 Encounter for supervision of normal pregnancy, unspecified, third trimester: Secondary | ICD-10-CM | POA: Diagnosis not present

## 2016-06-02 LAB — POCT URINALYSIS DIPSTICK
Bilirubin, UA: NEGATIVE
Blood, UA: NEGATIVE
Glucose, UA: NEGATIVE
Ketones, UA: NEGATIVE
Leukocytes, UA: NEGATIVE
Nitrite, UA: NEGATIVE
Protein, UA: NEGATIVE
Spec Grav, UA: 1.01
Urobilinogen, UA: 0.2
pH, UA: 6.5

## 2016-06-02 MED ORDER — LORATADINE-PSEUDOEPHEDRINE ER 10-240 MG PO TB24: 1.0000 | ORAL_TABLET | Freq: Every day | ORAL | 0 refills | Status: DC

## 2016-06-02 MED ORDER — CEFDINIR 300 MG PO CAPS: 300.0000 mg | ORAL_CAPSULE | Freq: Two times a day (BID) | ORAL | 0 refills | Status: DC

## 2016-06-02 MED ORDER — SALINE NASAL SPRAY 0.65 % NA SOLN: 1.0000 | NASAL | 12 refills | Status: DC | PRN

## 2016-06-02 NOTE — Patient Instructions (Signed)

## 2016-06-02 NOTE — Progress Notes (Signed)
OB sick visit- head congestion,lots of green thick mucous, took zpak last week not any better Pt also would like to be checked to see if she has a yeast inf, has noticed increase in d/c

## 2016-06-02 NOTE — Progress Notes (Signed)
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 States both ears are hurting now too. And has an increase in white discharge without irritation or odor.  Ear with bilateral bulging tympanic membranes and green fluid behind right; nasal passages boggy with green drainage, throat clear. RX sent in for omnecef bid x7d, claritin-D daily x 7 days, Saline NS bid prn.  Microscopic wet-mount exam shows negative for pathogens, normal epithelial cells. Patient reassured.

## 2016-06-03 ENCOUNTER — Other Ambulatory Visit: Payer: Medicaid Other

## 2016-06-05 ENCOUNTER — Encounter: Payer: Self-pay | Admitting: Emergency Medicine

## 2016-06-05 ENCOUNTER — Emergency Department
Admission: EM | Admit: 2016-06-05 | Discharge: 2016-06-05 | Disposition: A | Discharge status: Home / Self Care | Payer: Medicaid Other | Attending: Emergency Medicine | Admitting: Emergency Medicine

## 2016-06-05 DIAGNOSIS — Y999 Unspecified external cause status: Secondary | ICD-10-CM | POA: Insufficient documentation

## 2016-06-05 DIAGNOSIS — Y939 Activity, unspecified: Secondary | ICD-10-CM | POA: Insufficient documentation

## 2016-06-05 DIAGNOSIS — Z79899 Other long term (current) drug therapy: Secondary | ICD-10-CM | POA: Diagnosis not present

## 2016-06-05 DIAGNOSIS — F17200 Nicotine dependence, unspecified, uncomplicated: Secondary | ICD-10-CM | POA: Diagnosis not present

## 2016-06-05 DIAGNOSIS — S20219A Contusion of unspecified front wall of thorax, initial encounter: Secondary | ICD-10-CM | POA: Diagnosis not present

## 2016-06-05 DIAGNOSIS — S0990XA Unspecified injury of head, initial encounter: Secondary | ICD-10-CM | POA: Insufficient documentation

## 2016-06-05 DIAGNOSIS — Y929 Unspecified place or not applicable: Secondary | ICD-10-CM | POA: Diagnosis not present

## 2016-06-05 DIAGNOSIS — Z3A28 28 weeks gestation of pregnancy: Secondary | ICD-10-CM | POA: Diagnosis not present

## 2016-06-05 DIAGNOSIS — O9A212 Injury, poisoning and certain other consequences of external causes complicating pregnancy, second trimester: Secondary | ICD-10-CM | POA: Insufficient documentation

## 2016-06-05 NOTE — ED Notes (Signed)
[employer] in to interview pt in triage. Pt will be placed in the family waiting room.

## 2016-06-05 NOTE — Discharge Instructions (Signed)
Signs of a serious head injury include vomiting, severe headache, excessive sleepiness or confusion, and weakness or numbness in your face, arms or legs.  Return immediately to the Emergency Department if you experience any of these more concerning symptoms OR if you develop any abdominal pain, loss or leakage of vaginal fluid, vaginal bleeding, have "contraction" or other new concerns arise.
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
## 2016-06-08 ENCOUNTER — Other Ambulatory Visit: Payer: Medicaid Other

## 2016-06-08 ENCOUNTER — Other Ambulatory Visit: Payer: Self-pay | Admitting: Obstetrics and Gynecology

## 2016-06-08 DIAGNOSIS — R7309 Other abnormal glucose: Secondary | ICD-10-CM

## 2016-06-09 ENCOUNTER — Ambulatory Visit (INDEPENDENT_AMBULATORY_CARE_PROVIDER_SITE_OTHER): Payer: Medicaid Other | Admitting: Obstetrics and Gynecology

## 2016-06-09 VITALS — BP 102/63 | HR 99 | Wt 179.2 lb

## 2016-06-09 DIAGNOSIS — Z23 Encounter for immunization: Secondary | ICD-10-CM

## 2016-06-09 DIAGNOSIS — Z3493 Encounter for supervision of normal pregnancy, unspecified, third trimester: Secondary | ICD-10-CM

## 2016-06-09 DIAGNOSIS — Z3483 Encounter for supervision of other normal pregnancy, third trimester: Secondary | ICD-10-CM

## 2016-06-09 DIAGNOSIS — O9A313 Physical abuse complicating pregnancy, third trimester: Secondary | ICD-10-CM

## 2016-06-09 DIAGNOSIS — IMO0002 Reserved for concepts with insufficient information to code with codable children: Secondary | ICD-10-CM

## 2016-06-09 DIAGNOSIS — O09523 Supervision of elderly multigravida, third trimester: Secondary | ICD-10-CM

## 2016-06-09 LAB — POCT URINALYSIS DIPSTICK
Bilirubin, UA: NEGATIVE
Blood, UA: NEGATIVE
Glucose, UA: NEGATIVE
Ketones, UA: NEGATIVE
Leukocytes, UA: NEGATIVE
Nitrite, UA: NEGATIVE
Protein, UA: NEGATIVE
Spec Grav, UA: 1.025
Urobilinogen, UA: NEGATIVE
pH, UA: 6

## 2016-06-09 LAB — GESTATIONAL GLUCOSE TOLERANCE
Glucose, Fasting: 77 mg/dL (ref 65–94)
Glucose, GTT - 1 Hour: 150 mg/dL (ref 65–179)
Glucose, GTT - 2 Hour: 103 mg/dL (ref 65–154)
Glucose, GTT - 3 Hour: 74 mg/dL (ref 65–139)

## 2016-06-09 MED ORDER — PANTOPRAZOLE SODIUM 20 MG PO TBEC: 20.0000 mg | DELAYED_RELEASE_TABLET | Freq: Every day | ORAL | 3 refills | Status: DC

## 2016-06-09 MED ORDER — TETANUS-DIPHTH-ACELL PERTUSSIS 5-2.5-18.5 LF-MCG/0.5 IM SUSP
0.5000 mL | Freq: Once | INTRAMUSCULAR | Status: AC
  Administered 2016-06-09: 0.5 mL via INTRAMUSCULAR

## 2016-06-09 MED ORDER — RANITIDINE HCL 150 MG PO TABS: 150.0000 mg | ORAL_TABLET | Freq: Two times a day (BID) | ORAL | 3 refills | Status: DC
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
## 2016-06-21 ENCOUNTER — Encounter: Payer: Self-pay | Admitting: *Deleted

## 2016-06-21 ENCOUNTER — Observation Stay
Admission: EM | Admit: 2016-06-21 | Discharge: 2016-06-21 | Disposition: A | Discharge status: Home / Self Care | Payer: Medicaid Other | Attending: Obstetrics and Gynecology | Admitting: Obstetrics and Gynecology

## 2016-06-21 DIAGNOSIS — Z3A31 31 weeks gestation of pregnancy: Secondary | ICD-10-CM | POA: Diagnosis not present

## 2016-06-21 DIAGNOSIS — N898 Other specified noninflammatory disorders of vagina: Secondary | ICD-10-CM | POA: Insufficient documentation

## 2016-06-21 DIAGNOSIS — O26893 Other specified pregnancy related conditions, third trimester: Principal | ICD-10-CM | POA: Insufficient documentation

## 2016-06-21 NOTE — Final Progress Note (Signed)
L&D OB Triage Note  SUBJECTIVE Vanessa Franco is a 35 y.o. G62P0020 female at [redacted]w[redacted]d, EDD Estimated Date of Delivery: 08/22/16 who presented to triage with complaints of watery white vaginal discharge. Recently treated with antibiotics. No vulvar or vaginal itching. No contractions. Good FM.    OBJECTIVE Nursing Evaluation: BP 120/72 (BP Location: Left Arm)   Pulse (!) 106   Temp 98.7 F (37.1 C) (Oral)   Resp 16   Ht 5\' 7"  (1.702 m)   Wt 180 lb (81.6 kg)   LMP 11/16/2015 (Exact Date)   BMI 28.19 kg/m  no significant findings for ROM or PML.  NST was performed and has been reviewed by me.  NST INTERPRETATION: Indications: Vaginal Discharge  Mode: External Baseline Rate (A): 145 bpm Variability: Moderate Accelerations: 15 x 15 Decelerations: None     Contraction Frequency (min): ui noted, no ctx noted  ASSESSMENT Impression:  1. Pregnancy:  G3P0020 at [redacted]w[redacted]d , EDD Estimated Date of Delivery: 08/22/16 2.  Reactive NST 3. Vaginal Discharge  PLAN 1. Reassurance given 2. Discharge home with bleeding/labor precautions.  3. Continue routine prenatal care. 4. May use OTC Monistat.   Brayton Mars, MD

## 2016-06-21 NOTE — OB Triage Note (Addendum)
Woke this am to vaginal discharge. Color: white, liquid. Odor noted. Denies any bleeding. Reports rectal pressure. Denies hemorrhoids. Recently completed a course of abx. Vanessa Franco

## 2016-06-23 ENCOUNTER — Ambulatory Visit (INDEPENDENT_AMBULATORY_CARE_PROVIDER_SITE_OTHER): Payer: Medicaid Other | Admitting: Obstetrics and Gynecology

## 2016-06-23 VITALS — BP 110/68 | HR 97 | Wt 185.7 lb

## 2016-06-23 DIAGNOSIS — N898 Other specified noninflammatory disorders of vagina: Secondary | ICD-10-CM

## 2016-06-23 DIAGNOSIS — O09523 Supervision of elderly multigravida, third trimester: Secondary | ICD-10-CM

## 2016-06-23 LAB — POCT URINALYSIS DIPSTICK
Bilirubin, UA: NEGATIVE
Blood, UA: NEGATIVE
Glucose, UA: NEGATIVE
Ketones, UA: NEGATIVE
Nitrite, UA: NEGATIVE
Protein, UA: NEGATIVE
Spec Grav, UA: 1.015
Urobilinogen, UA: NEGATIVE
pH, UA: 7

## 2016-06-23 NOTE — Progress Notes (Signed)
ROB: Complains of excessive discharge, mostly watery. Denies itching/burning. Was seen in [facility] triage 2 days ago for discharge and r/o SROM.  Was didscharged home and instructed to use Monistat.  Notes that she did not start medication as she did not believe diagnosis.  Desires exam today.  Microscopic wet-mount exam shows negative for pathogens, normal epithelial cells. Likely increased leukorrhea of pregnancy. RTC in 2 weeks.

## 2016-06-26 LAB — URINE CULTURE: Organism ID, Bacteria: NO GROWTH

## 2016-07-07 ENCOUNTER — Ambulatory Visit (INDEPENDENT_AMBULATORY_CARE_PROVIDER_SITE_OTHER): Payer: Medicaid Other | Admitting: Obstetrics and Gynecology

## 2016-07-07 VITALS — BP 110/69 | HR 85 | Wt 189.0 lb

## 2016-07-07 DIAGNOSIS — O09523 Supervision of elderly multigravida, third trimester: Secondary | ICD-10-CM | POA: Diagnosis not present

## 2016-07-07 DIAGNOSIS — Z23 Encounter for immunization: Secondary | ICD-10-CM | POA: Diagnosis not present

## 2016-07-07 LAB — POCT URINALYSIS DIPSTICK
Bilirubin, UA: NEGATIVE
Glucose, UA: NEGATIVE
Ketones, UA: NEGATIVE
Leukocytes, UA: NEGATIVE
Nitrite, UA: NEGATIVE
Protein, UA: NEGATIVE
Spec Grav, UA: 1.02
Urobilinogen, UA: NEGATIVE
pH, UA: 6.5

## 2016-07-07 NOTE — Progress Notes (Signed)
  ROB: Denies complaints. For flu vacine today.  RTC in 2 weeks.

## 2016-07-14 ENCOUNTER — Telehealth: Payer: Self-pay | Admitting: Obstetrics and Gynecology

## 2016-07-14 NOTE — Telephone Encounter (Signed)
Pt called and she stated she would like for you to call her. She would not tell me why

## 2016-07-14 NOTE — Telephone Encounter (Signed)
Called pt she states that beginning last night she has had severe back and vaginal pain. Pt states that she is barely able to walk due to pain, pt denies vaginal bleeding, or leaking fluid. Pt notes that last night she had period like cramping and diarrhea. Has tried nothing for relief. Advised pt on use of extra strength tylenol, warm bath with epsom salt, increase fluid. To call back if no relief.

## 2016-07-21 ENCOUNTER — Ambulatory Visit (INDEPENDENT_AMBULATORY_CARE_PROVIDER_SITE_OTHER): Payer: Medicaid Other | Admitting: Obstetrics and Gynecology

## 2016-07-21 VITALS — BP 111/64 | HR 86 | Wt 191.0 lb

## 2016-07-21 DIAGNOSIS — O26899 Other specified pregnancy related conditions, unspecified trimester: Secondary | ICD-10-CM

## 2016-07-21 DIAGNOSIS — O09523 Supervision of elderly multigravida, third trimester: Secondary | ICD-10-CM

## 2016-07-21 DIAGNOSIS — R102 Pelvic and perineal pain: Secondary | ICD-10-CM

## 2016-07-21 LAB — POCT URINALYSIS DIPSTICK
Bilirubin, UA: NEGATIVE
Glucose, UA: NEGATIVE
Ketones, UA: NEGATIVE
Nitrite, UA: NEGATIVE
Protein, UA: NEGATIVE
Spec Grav, UA: 1.02
Urobilinogen, UA: NEGATIVE
pH, UA: 7.5

## 2016-07-21 MED ORDER — ACETAMINOPHEN-CODEINE #3 300-30 MG PO TABS: 1.0000 | ORAL_TABLET | Freq: Four times a day (QID) | ORAL | 0 refills | Status: DC | PRN

## 2016-07-21 NOTE — Progress Notes (Signed)
ROB

## 2016-07-21 NOTE — Progress Notes (Signed)
ROB: Notes butt, hip, pelvic and leg pain. Not relieved by ES Tylenol. Encouraged use of belly band, warm baths.  Prescribed T#3 for short term use.  RTC in 1 week.

## 2016-07-24 ENCOUNTER — Observation Stay
Admission: EM | Admit: 2016-07-24 | Discharge: 2016-07-24 | Disposition: A | Discharge status: Home / Self Care | Payer: Medicaid Other | Attending: Obstetrics and Gynecology | Admitting: Obstetrics and Gynecology

## 2016-07-24 DIAGNOSIS — O26893 Other specified pregnancy related conditions, third trimester: Secondary | ICD-10-CM | POA: Diagnosis present

## 2016-07-24 DIAGNOSIS — Z3A35 35 weeks gestation of pregnancy: Secondary | ICD-10-CM | POA: Insufficient documentation

## 2016-07-24 DIAGNOSIS — R109 Unspecified abdominal pain: Secondary | ICD-10-CM | POA: Insufficient documentation

## 2016-07-24 DIAGNOSIS — R102 Pelvic and perineal pain: Secondary | ICD-10-CM | POA: Diagnosis not present

## 2016-07-24 MED ORDER — ACETAMINOPHEN-CODEINE #3 300-30 MG PO TABS
ORAL_TABLET | ORAL | Status: AC
  Filled 2016-07-24: qty 1

## 2016-07-24 MED ORDER — ACETAMINOPHEN-CODEINE #3 300-30 MG PO TABS: 1.0000 | ORAL_TABLET | Freq: Once | ORAL | Status: DC

## 2016-07-24 NOTE — Discharge Instructions (Signed)
Take tylenol 3 as previously prescribed Hot showers or warm baths for discomfort Warm compress to vaginal area for discomfort Belly band for support as discussed by provider at previous visit

## 2016-07-24 NOTE — OB Triage Note (Signed)
[name] here with c/o abdominal and vaginal pain, started approx 1-2 hours ago, denies bleeding, LOF, reports +FM, last intercourse yesterday.

## 2016-07-24 NOTE — Progress Notes (Signed)
In room with pt, she refuses to stay to be seen by Dr. Marcelline Mates, states "I am leaving, get my discharge papers" She also asked for work note. Discharge instructions reviewed with pt regarding when to seek care, instructions regarding home care for discomfort as discussed by MD at most recent office visit 10/31.

## 2016-07-24 NOTE — Progress Notes (Signed)
Went to room to talk to pt regarding Dr. Andreas Blower offer of tylenol 3 (which pt already has rx for), pt states she has rx, took last one 2-3 days ago, didn't like how it made her feel. Wanted to know if we were to do SVE, adv pt no, since she wasn't contracting regularly. Pt talking to someone on phone, heard her state "I'm going to [facility] to have my baby"  Called MD, will come see patient.

## 2016-07-24 NOTE — Progress Notes (Signed)
L&D OB Triage Note  Vanessa Franco is a 35 y.o. G34P0020 female at [redacted]w[redacted]d, EDD Estimated Date of Delivery: 08/22/16 who presented to triage for complaints of vaginal pain.  She was evaluated by the nurses with no significant findings for labor. Vital signs stable. An NST was performed and has been reviewed by MD. She was treated with Tylenol #3.   NST INTERPRETATION: Indications: rule out uterine contractions  Mode: External Baseline Rate (A): 152 bpm Variability: Moderate Accelerations: 15 x 15 Decelerations: None     Contraction Frequency (min): irritability   Impression: reactive   Plan: NST performed was reviewed and was found to be reactive. She was discharged home with bleeding/labor precautions.  Continue routine prenatal care. Follow up with OB/GYN as previously scheduled.     [name], MD  [facility]

## 2016-07-28 ENCOUNTER — Ambulatory Visit (INDEPENDENT_AMBULATORY_CARE_PROVIDER_SITE_OTHER): Payer: Medicaid Other | Admitting: Obstetrics and Gynecology

## 2016-07-28 VITALS — BP 119/72 | HR 83 | Wt 193.7 lb

## 2016-07-28 DIAGNOSIS — Z3685 Encounter for antenatal screening for Streptococcus B: Secondary | ICD-10-CM

## 2016-07-28 DIAGNOSIS — Z113 Encounter for screening for infections with a predominantly sexual mode of transmission: Secondary | ICD-10-CM

## 2016-07-28 DIAGNOSIS — B3731 Acute candidiasis of vulva and vagina: Secondary | ICD-10-CM

## 2016-07-28 DIAGNOSIS — O26899 Other specified pregnancy related conditions, unspecified trimester: Secondary | ICD-10-CM

## 2016-07-28 DIAGNOSIS — R102 Pelvic and perineal pain: Secondary | ICD-10-CM

## 2016-07-28 DIAGNOSIS — B373 Candidiasis of vulva and vagina: Secondary | ICD-10-CM

## 2016-07-28 DIAGNOSIS — O09523 Supervision of elderly multigravida, third trimester: Secondary | ICD-10-CM

## 2016-07-28 LAB — POCT URINALYSIS DIPSTICK
Bilirubin, UA: NEGATIVE
Blood, UA: NEGATIVE
Glucose, UA: NEGATIVE
Ketones, UA: NEGATIVE
Nitrite, UA: NEGATIVE
Protein, UA: NEGATIVE
Spec Grav, UA: 1.02
Urobilinogen, UA: NEGATIVE
pH, UA: 7

## 2016-07-28 NOTE — Progress Notes (Signed)
ROB: Patient complains of increased pelvic pressure and back pain.  Went to L&D over the weekend.  Has not taken T#3.  Given PTL precautions. Also notes thick white clumpy discharge.  Has started OTC Monistat for suspected yeast infection. Can continue tx. 36 week labs done. RTC in 1 week.

## 2016-07-31 LAB — GC/CHLAMYDIA PROBE AMP
Chlamydia trachomatis, NAA: NEGATIVE
Neisseria gonorrhoeae by PCR: NEGATIVE

## 2016-08-01 LAB — CULTURE, BETA STREP (GROUP B ONLY): Strep Gp B Culture: POSITIVE — AB

## 2016-08-04 ENCOUNTER — Ambulatory Visit (INDEPENDENT_AMBULATORY_CARE_PROVIDER_SITE_OTHER): Payer: Medicaid Other | Admitting: Obstetrics and Gynecology

## 2016-08-04 VITALS — BP 128/74 | HR 101 | Wt 196.6 lb

## 2016-08-04 DIAGNOSIS — L089 Local infection of the skin and subcutaneous tissue, unspecified: Secondary | ICD-10-CM

## 2016-08-04 DIAGNOSIS — O09523 Supervision of elderly multigravida, third trimester: Secondary | ICD-10-CM

## 2016-08-04 LAB — POCT URINALYSIS DIPSTICK
Bilirubin, UA: NEGATIVE
Blood, UA: NEGATIVE
Glucose, UA: NEGATIVE
Ketones, UA: NEGATIVE
Leukocytes, UA: NEGATIVE
Nitrite, UA: NEGATIVE
Protein, UA: NEGATIVE
Spec Grav, UA: 1.015
Urobilinogen, UA: NEGATIVE
pH, UA: 6.5

## 2016-08-04 MED ORDER — CEPHALEXIN 500 MG PO CAPS: 500.0000 mg | ORAL_CAPSULE | Freq: Three times a day (TID) | ORAL | 0 refills | Status: DC

## 2016-08-04 NOTE — Progress Notes (Signed)
ROB: Patient c/o stiffness and pain in right hand with swelling, is s/p fall several days ago.  Also notes left middle digit with skin infection, has been placing Neosporin and cleaning with peroxide.  Advised on wrapping right wrist with Ace bandage, left digit appears infected, prescribed Keflex. Continue with Neosporin and cleansing area. Pelvic exam deferred. RTC in 1 week.

## 2016-08-12 ENCOUNTER — Ambulatory Visit (INDEPENDENT_AMBULATORY_CARE_PROVIDER_SITE_OTHER): Payer: Medicaid Other | Admitting: Obstetrics and Gynecology

## 2016-08-12 VITALS — BP 126/84 | HR 85 | Wt 196.5 lb

## 2016-08-12 DIAGNOSIS — Z3493 Encounter for supervision of normal pregnancy, unspecified, third trimester: Secondary | ICD-10-CM

## 2016-08-12 NOTE — Progress Notes (Signed)
ROB- pt is having a lot of pelvic pressure, lots of pressure in her rectum

## 2016-08-12 NOTE — Progress Notes (Signed)
ROB- doing well labor precautions discussed.

## 2016-08-14 ENCOUNTER — Encounter: Payer: Self-pay | Admitting: *Deleted

## 2016-08-14 ENCOUNTER — Observation Stay
Admission: EM | Admit: 2016-08-14 | Discharge: 2016-08-14 | Disposition: A | Discharge status: Home / Self Care | Payer: Medicaid Other | Admitting: Obstetrics and Gynecology

## 2016-08-14 ENCOUNTER — Observation Stay
Admission: EM | Admit: 2016-08-14 | Discharge: 2016-08-14 | Disposition: A | Discharge status: Home / Self Care | Payer: Medicaid Other | Source: Home / Self Care | Admitting: Obstetrics and Gynecology

## 2016-08-14 DIAGNOSIS — O479 False labor, unspecified: Secondary | ICD-10-CM | POA: Diagnosis present

## 2016-08-14 DIAGNOSIS — Z79899 Other long term (current) drug therapy: Secondary | ICD-10-CM | POA: Insufficient documentation

## 2016-08-14 DIAGNOSIS — Z3A38 38 weeks gestation of pregnancy: Secondary | ICD-10-CM | POA: Insufficient documentation

## 2016-08-14 DIAGNOSIS — O471 False labor at or after 37 completed weeks of gestation: Secondary | ICD-10-CM

## 2016-08-14 MED ORDER — ZOLPIDEM TARTRATE 5 MG PO TABS: 5.0000 mg | ORAL_TABLET | Freq: Every evening | ORAL | 1 refills | Status: DC | PRN

## 2016-08-14 NOTE — OB Triage Note (Signed)
Pt. Here earlier today for r/o labor, discharged to home - early phrase of labor.  Pt. Here for contractions that are every five minutes and increasing in intensity.  FHR - 140s, contractions palpate mild. Positive for fetal movement, denies sudden gush of fluid, no vaginal bleeding.

## 2016-08-14 NOTE — Final Progress Note (Signed)
Physician Final Progress Note  Patient ID: Vanessa Franco MRN: [id number] DOB/AGE: [birth year] 35 y.o.  Admit date: 08/14/2016 Admitting provider: Malachy Mood, MD Discharge date: 08/14/2016   Admission Diagnoses: Irregular contractions  Discharge Diagnoses:  Active Problems:   Irregular contractions  35 yo G3P0020 [redacted]w[redacted]d second presentation today for contractions still unchanged at 3/70-80/-1.  Given Rx for ambien for sleep tonight to help with prodromal labor symptoms  Consults: None  Significant Findings/ Diagnostic Studies: none  Procedures: NST reactive category I tracing 135, moderate, +accels, no decels q5-19min contractions  Discharge Condition: good  Disposition: 01-Home or Self Care  Diet: Regular diet  Discharge Activity: Activity as tolerated  Discharge Instructions    Discharge activity:  No Restrictions    Complete by:  As directed    Fetal Kick Count:  Lie on our left side for one hour after a meal, and count the number of times your baby kicks.  If it is less than 5 times, get up, move around and drink some juice.  Repeat the test 30 minutes later.  If it is still less than 5 kicks in an hour, notify your doctor.    Complete by:  As directed    LABOR:  When conractions begin, you should start to time them from the beginning of one contraction to the beginning  of the next.  When contractions are 5 - 10 minutes apart or less and have been regular for at least an hour, you should call your health care provider.    Complete by:  As directed    No sexual activity restrictions    Complete by:  As directed    Notify physician for bleeding from the vagina    Complete by:  As directed    Notify physician for blurring of vision or spots before the eyes    Complete by:  As directed    Notify physician for chills or fever    Complete by:  As directed    Notify physician for fainting spells, "black outs" or loss of consciousness    Complete by:  As directed     Notify physician for increase in vaginal discharge    Complete by:  As directed    Notify physician for leaking of fluid    Complete by:  As directed    Notify physician for pain or burning when urinating    Complete by:  As directed    Notify physician for pelvic pressure (sudden increase)    Complete by:  As directed    Notify physician for severe or continued nausea or vomiting    Complete by:  As directed    Notify physician for sudden gushing of fluid from the vagina (with or without continued leaking)    Complete by:  As directed    Notify physician for sudden, constant, or occasional abdominal pain    Complete by:  As directed    Notify physician if baby moving less than usual    Complete by:  As directed        Medication List    TAKE these medications   acetaminophen-codeine 300-30 MG tablet Commonly known as:  TYLENOL #3 Take 1-2 tablets by mouth every 6 (six) hours as needed for moderate pain.   cephALEXin 500 MG capsule Commonly known as:  KEFLEX Take 1 capsule (500 mg total) by mouth 3 (three) times daily.   omega-3 acid ethyl esters 1 g capsule Commonly known as:  LOVAZA Take  by mouth 2 (two) times daily.   pantoprazole 20 MG tablet Commonly known as:  PROTONIX Take 1 tablet (20 mg total) by mouth daily.   prenatal multivitamin Tabs tablet Take 1 tablet by mouth daily at 12 noon.   zolpidem 5 MG tablet Commonly known as:  AMBIEN Take 1 tablet (5 mg total) by mouth at bedtime as needed for sleep.        Total time spent taking care of this patient: 20 minutes  Signed: Dorthula Nettles 08/14/2016, 8:28 PM

## 2016-08-14 NOTE — Discharge Summary (Signed)
See final progress note. 

## 2016-08-14 NOTE — Discharge Summary (Signed)
Pt d/c'd to home with friend in stable condition. Given labor precautions, verbalized understanding. Future appointment made.

## 2016-08-14 NOTE — Final Progress Note (Signed)
Physician Final Progress Note  Patient ID: SAANVI MALIA MRN: [id number] DOB/AGE: [birth year] 35 y.o.  Admit date: 08/14/2016 Admitting provider: Malachy Mood, MD Discharge date: 08/14/2016   Admission Diagnoses: Irregular contractions  Discharge Diagnoses:  Active Problems:   Irregular contractions  35 yo G3P0020 at [redacted]w[redacted]d presenting with irregular contractions, no cervical change over 2-hrs  Consults: None  Significant Findings/ Diagnostic Studies: none  Procedures: NST 130, moderate variability, +accels, 1 variable over 2-hrs of monitoring reactive category I tracing contractions q7-22min  Discharge Condition: good  Disposition: 01-Home or Self Care  Diet: Regular diet  Discharge Activity: Activity as tolerated     Medication List    TAKE these medications   acetaminophen-codeine 300-30 MG tablet Commonly known as:  TYLENOL #3 Take 1-2 tablets by mouth every 6 (six) hours as needed for moderate pain.   cephALEXin 500 MG capsule Commonly known as:  KEFLEX Take 1 capsule (500 mg total) by mouth 3 (three) times daily.   omega-3 acid ethyl esters 1 g capsule Commonly known as:  LOVAZA Take by mouth 2 (two) times daily.   pantoprazole 20 MG tablet Commonly known as:  PROTONIX Take 1 tablet (20 mg total) by mouth daily.   prenatal multivitamin Tabs tablet Take 1 tablet by mouth daily at 12 noon.        Total time spent taking care of this patient: 30 minutes  Signed: Dorthula Nettles 08/14/2016, 1:30 PM

## 2016-08-15 ENCOUNTER — Encounter: Payer: Self-pay | Admitting: Obstetrics and Gynecology

## 2016-08-15 ENCOUNTER — Inpatient Hospital Stay
Admission: EM | Admit: 2016-08-15 | Discharge: 2016-08-18 | DRG: 775 | Disposition: A | Discharge status: Home / Self Care | Payer: Medicaid Other | Attending: Obstetrics and Gynecology | Admitting: Obstetrics and Gynecology

## 2016-08-15 DIAGNOSIS — F1290 Cannabis use, unspecified, uncomplicated: Secondary | ICD-10-CM | POA: Diagnosis present

## 2016-08-15 DIAGNOSIS — Z8249 Family history of ischemic heart disease and other diseases of the circulatory system: Secondary | ICD-10-CM

## 2016-08-15 DIAGNOSIS — Z833 Family history of diabetes mellitus: Secondary | ICD-10-CM | POA: Diagnosis not present

## 2016-08-15 DIAGNOSIS — Z3A39 39 weeks gestation of pregnancy: Secondary | ICD-10-CM | POA: Diagnosis not present

## 2016-08-15 DIAGNOSIS — Z3493 Encounter for supervision of normal pregnancy, unspecified, third trimester: Secondary | ICD-10-CM | POA: Diagnosis present

## 2016-08-15 DIAGNOSIS — O99334 Smoking (tobacco) complicating childbirth: Secondary | ICD-10-CM | POA: Diagnosis present

## 2016-08-15 DIAGNOSIS — O99324 Drug use complicating childbirth: Secondary | ICD-10-CM | POA: Diagnosis present

## 2016-08-15 DIAGNOSIS — F17210 Nicotine dependence, cigarettes, uncomplicated: Secondary | ICD-10-CM | POA: Diagnosis present

## 2016-08-15 DIAGNOSIS — Z3490 Encounter for supervision of normal pregnancy, unspecified, unspecified trimester: Secondary | ICD-10-CM | POA: Diagnosis not present

## 2016-08-15 LAB — TYPE AND SCREEN
ABO/RH(D): O POS
Antibody Screen: NEGATIVE

## 2016-08-15 LAB — CBC
HCT: 37.4 % (ref 35.0–47.0)
Hemoglobin: 12.8 g/dL (ref 12.0–16.0)
MCH: 31.1 pg (ref 26.0–34.0)
MCHC: 34.2 g/dL (ref 32.0–36.0)
MCV: 90.9 fL (ref 80.0–100.0)
Platelets: 183 10*3/uL (ref 150–440)
RBC: 4.12 MIL/uL (ref 3.80–5.20)
RDW: 13.7 % (ref 11.5–14.5)
WBC: 14.4 10*3/uL — ABNORMAL HIGH (ref 3.6–11.0)

## 2016-08-15 LAB — RAPID HIV SCREEN (HIV 1/2 AB+AG)
HIV 1/2 Antibodies: NONREACTIVE
HIV-1 P24 Antigen - HIV24: NONREACTIVE

## 2016-08-15 MED ORDER — ONDANSETRON HCL 4 MG/2ML IJ SOLN: 4.0000 mg | Freq: Four times a day (QID) | INTRAMUSCULAR | Status: DC | PRN

## 2016-08-15 MED ORDER — PENICILLIN G POTASSIUM 5000000 UNITS IJ SOLR
5.0000 10*6.[IU] | Freq: Once | INTRAVENOUS | Status: AC
  Administered 2016-08-15: 5 10*6.[IU] via INTRAVENOUS
  Filled 2016-08-15: qty 5

## 2016-08-15 MED ORDER — LACTATED RINGERS IV SOLN: 500.0000 mL | INTRAVENOUS | Status: DC | PRN

## 2016-08-15 MED ORDER — LACTATED RINGERS IV SOLN
INTRAVENOUS | Status: DC
  Administered 2016-08-15 – 2016-08-16 (×2): via INTRAVENOUS

## 2016-08-15 MED ORDER — SOD CITRATE-CITRIC ACID 500-334 MG/5ML PO SOLN
30.0000 mL | ORAL | Status: DC | PRN
  Administered 2016-08-15 – 2016-08-16 (×2): 30 mL via ORAL
  Filled 2016-08-15 (×2): qty 15

## 2016-08-15 MED ORDER — PENICILLIN G POT IN DEXTROSE 60000 UNIT/ML IV SOLN
3.0000 10*6.[IU] | INTRAVENOUS | Status: DC
  Administered 2016-08-16 (×3): 3 10*6.[IU] via INTRAVENOUS
  Filled 2016-08-15 (×9): qty 50

## 2016-08-15 MED ORDER — OXYTOCIN 10 UNIT/ML IJ SOLN: 10.0000 [IU] | Freq: Once | INTRAMUSCULAR | Status: DC

## 2016-08-15 MED ORDER — OXYTOCIN 40 UNITS IN LACTATED RINGERS INFUSION - SIMPLE MED: 2.5000 [IU]/h | INTRAVENOUS | Status: DC

## 2016-08-15 MED ORDER — OXYTOCIN BOLUS FROM INFUSION
500.0000 mL | Freq: Once | INTRAVENOUS | Status: AC
  Administered 2016-08-16: 500 mL via INTRAVENOUS

## 2016-08-15 MED ORDER — LIDOCAINE HCL (PF) 1 % IJ SOLN: 30.0000 mL | INTRAMUSCULAR | Status: DC | PRN

## 2016-08-15 NOTE — H&P (Signed)
OB History & Physical   History of Present Illness:  Chief Complaint: contractions and leaking fluid  HPI:  EVIAN MERISIER is a 35 y.o. G17P0020 female at [redacted]w[redacted]d dated by LMP consistent with a 5 week ultrasound.  Her pregnancy has been complicated by advanced maternal age, .    She reports contractions.   She reports leakage of fluid at 3pm today with fluid reported as clear.   She denies vaginal bleeding.   She reports fetal movement.    Maternal Medical History:   Past Medical History:  Diagnosis Date  . Anxiety   . Depression   . Fibroid   . History of D&C   . History of uterine fibroid 11/2013   [facility]    Past Surgical History:  Procedure Laterality Date  . DILATION AND CURETTAGE OF UTERUS    . MYOMECTOMY  2015   Hysteroscopic    Allergies  Allergen Reactions  . Ketorolac Other (See Comments)    Swelling, water retention  . Toradol [Ketorolac Tromethamine] Swelling  . Sulfa Antibiotics Rash    Prior to Admission medications   Medication Sig Start Date End Date Taking? Authorizing Provider  pantoprazole (PROTONIX) 20 MG tablet Take 1 tablet (20 mg total) by mouth daily. 06/09/16  Yes [name], MD  Prenatal Vit-Fe Fumarate-FA (PRENATAL MULTIVITAMIN) TABS tablet Take 1 tablet by mouth daily at 12 noon.   Yes Historical Provider, MD    OB History  Gravida Para Term Preterm AB Living  3       2    SAB TAB Ectopic Multiple Live Births  2            # Outcome Date GA Lbr Len/2nd Weight Sex Delivery Anes PTL Lv  3 Current           2 SAB 2002        FD  1 SAB 2001        FD      Prenatal care site: [facility]  Social History: She  reports that she has been smoking.  She has been smoking about 0.25 packs per day. She has never used smokeless tobacco. She reports that she uses drugs, including Marijuana. She reports that she does not drink alcohol.  Family History: family history includes Diabetes in her father; Heart failure in her father; Hypertension in her  mother; Kidney failure in her father; Rheum arthritis in her mother.   Review of Systems: Negative x 10 systems reviewed except as noted in the HPI.    Physical Exam:  Vital Signs: BP 122/82 (BP Location: Left Arm)   Pulse (!) 105   Temp 98.4 F (36.9 C) (Oral)   Resp 18   LMP 11/16/2015 (Exact Date)  General: no acute distress.  HEENT: normocephalic, atraumatic Heart: regular rate & rhythm.  No murmurs/rubs/gallops Lungs: clear to auscultation bilaterally Abdomen: soft, gravid, non-tender;  EFW: 7.5 pounds Pelvic: (female chaperone present during pelvic exam)  External: Normal external female genitalia  Cervix: Dilation: 3 / Effacement (%): 70 / Station: -2   ROM: + pooling; + nitrazine; + ferning Extremities: non-tender, symmetric, no edema bilaterally.  DTRs: 2+  Neurologic: Alert & oriented x 3.    Pertinent Results:  Prenatal Labs: Blood type/Rh O positive  Antibody screen negative  Rubella Not immune  Varicella Non-immune    RPR NR  HBsAg negative  HIV negative  GC negative  Chlamydia negative  Genetic screening Negative 1st trim screen  1 hour  GTT 164  3 hour GTT 77, 150, 103, 74  GBS positive    Baseline FHR: 135 beats/min   Variability: moderate   Accelerations: present   Decelerations: absent Contractions: present frequency: 3 q 10 min Overall assessment: category 1  Assessment:  Ninah Manzueta Sitter is a 35 y.o. G5P0020 female at [redacted]w[redacted]d with spontaneous rupture of membranes.   Plan:  1. Admit to Labor & Delivery  2. CBC, T&S, Clrs, IVF 3. GBS positive - pcn 4. Fetwal well-being: reassuring w category 1 5. UDS with history of THC use   Will Bonnet, MD 08/15/2016 7:49 PM

## 2016-08-16 ENCOUNTER — Inpatient Hospital Stay: Payer: Medicaid Other | Admitting: Anesthesiology

## 2016-08-16 ENCOUNTER — Encounter: Payer: Self-pay | Admitting: *Deleted

## 2016-08-16 DIAGNOSIS — Z3490 Encounter for supervision of normal pregnancy, unspecified, unspecified trimester: Secondary | ICD-10-CM | POA: Diagnosis not present

## 2016-08-16 LAB — URINE DRUG SCREEN, QUALITATIVE (ARMC ONLY)
Amphetamines, Ur Screen: NOT DETECTED
Barbiturates, Ur Screen: NOT DETECTED
Benzodiazepine, Ur Scrn: NOT DETECTED
Cannabinoid 50 Ng, Ur ~~LOC~~: POSITIVE — AB
Cocaine Metabolite,Ur ~~LOC~~: NOT DETECTED
MDMA (Ecstasy)Ur Screen: NOT DETECTED
Methadone Scn, Ur: NOT DETECTED
Opiate, Ur Screen: POSITIVE — AB
Phencyclidine (PCP) Ur S: NOT DETECTED
Tricyclic, Ur Screen: NOT DETECTED

## 2016-08-16 MED ORDER — SCOPOLAMINE 1 MG/3DAYS TD PT72: 1.0000 | MEDICATED_PATCH | Freq: Once | TRANSDERMAL | Status: DC

## 2016-08-16 MED ORDER — HYDROCODONE-ACETAMINOPHEN 5-325 MG PO TABS
2.0000 | ORAL_TABLET | Freq: Four times a day (QID) | ORAL | Status: DC | PRN
  Administered 2016-08-17: 2 via ORAL
  Filled 2016-08-16 (×2): qty 2

## 2016-08-16 MED ORDER — SODIUM CHLORIDE 0.9 % IV SOLN
INTRAVENOUS | Status: DC | PRN
  Administered 2016-08-16 (×3): 5 mL via EPIDURAL

## 2016-08-16 MED ORDER — MEPERIDINE HCL 25 MG/ML IJ SOLN: 6.2500 mg | INTRAMUSCULAR | Status: DC | PRN

## 2016-08-16 MED ORDER — FENTANYL 2.5 MCG/ML W/ROPIVACAINE 0.2% IN NS 100 ML EPIDURAL INFUSION (ARMC-ANES)
EPIDURAL | Status: AC
  Filled 2016-08-16: qty 100

## 2016-08-16 MED ORDER — DIPHENHYDRAMINE HCL 50 MG/ML IJ SOLN: 12.5000 mg | INTRAMUSCULAR | Status: DC | PRN

## 2016-08-16 MED ORDER — DIPHENHYDRAMINE HCL 25 MG PO CAPS
25.0000 mg | ORAL_CAPSULE | Freq: Four times a day (QID) | ORAL | Status: DC | PRN
  Administered 2016-08-16: 25 mg via ORAL
  Filled 2016-08-16: qty 1

## 2016-08-16 MED ORDER — HYDROCODONE-ACETAMINOPHEN 5-325 MG PO TABS
1.0000 | ORAL_TABLET | Freq: Four times a day (QID) | ORAL | Status: DC | PRN
  Administered 2016-08-16 – 2016-08-17 (×2): 1 via ORAL
  Filled 2016-08-16: qty 1

## 2016-08-16 MED ORDER — ONDANSETRON HCL 4 MG/2ML IJ SOLN: 4.0000 mg | Freq: Three times a day (TID) | INTRAMUSCULAR | Status: DC | PRN

## 2016-08-16 MED ORDER — OXYTOCIN 10 UNIT/ML IJ SOLN
INTRAMUSCULAR | Status: AC
  Filled 2016-08-16: qty 2

## 2016-08-16 MED ORDER — NALBUPHINE HCL 10 MG/ML IJ SOLN: 5.0000 mg | INTRAMUSCULAR | Status: DC | PRN

## 2016-08-16 MED ORDER — LIDOCAINE-EPINEPHRINE (PF) 1.5 %-1:200000 IJ SOLN
INTRAMUSCULAR | Status: DC | PRN
  Administered 2016-08-16: 3 mL

## 2016-08-16 MED ORDER — BENZOCAINE-MENTHOL 20-0.5 % EX AERO
1.0000 "application " | INHALATION_SPRAY | CUTANEOUS | Status: DC | PRN
  Administered 2016-08-16: 1 via TOPICAL

## 2016-08-16 MED ORDER — TERBUTALINE SULFATE 1 MG/ML IJ SOLN: 0.2500 mg | Freq: Once | INTRAMUSCULAR | Status: DC | PRN

## 2016-08-16 MED ORDER — BENZOCAINE-MENTHOL 20-0.5 % EX AERO
INHALATION_SPRAY | CUTANEOUS | Status: AC
  Administered 2016-08-16: 1 via TOPICAL
  Filled 2016-08-16: qty 56

## 2016-08-16 MED ORDER — IBUPROFEN 600 MG PO TABS
600.0000 mg | ORAL_TABLET | Freq: Four times a day (QID) | ORAL | Status: DC
  Administered 2016-08-16 – 2016-08-17 (×5): 600 mg via ORAL
  Filled 2016-08-16 (×5): qty 1

## 2016-08-16 MED ORDER — ONDANSETRON HCL 4 MG PO TABS: 4.0000 mg | ORAL_TABLET | ORAL | Status: DC | PRN

## 2016-08-16 MED ORDER — DIPHENHYDRAMINE HCL 25 MG PO CAPS: 25.0000 mg | ORAL_CAPSULE | ORAL | Status: DC | PRN

## 2016-08-16 MED ORDER — FENTANYL 2.5 MCG/ML W/ROPIVACAINE 0.2% IN NS 100 ML EPIDURAL INFUSION (ARMC-ANES)
EPIDURAL | Status: DC | PRN
  Administered 2016-08-16: 10 mL/h via EPIDURAL

## 2016-08-16 MED ORDER — FERROUS SULFATE 325 (65 FE) MG PO TABS
325.0000 mg | ORAL_TABLET | Freq: Two times a day (BID) | ORAL | Status: DC
  Administered 2016-08-17 – 2016-08-18 (×3): 325 mg via ORAL
  Filled 2016-08-16 (×3): qty 1

## 2016-08-16 MED ORDER — WITCH HAZEL-GLYCERIN EX PADS: 1.0000 "application " | MEDICATED_PAD | CUTANEOUS | Status: DC | PRN

## 2016-08-16 MED ORDER — NALOXONE HCL 0.4 MG/ML IJ SOLN: 0.4000 mg | INTRAMUSCULAR | Status: DC | PRN

## 2016-08-16 MED ORDER — HYDROMORPHONE HCL 1 MG/ML IJ SOLN: 0.5000 mg | INTRAMUSCULAR | Status: DC | PRN

## 2016-08-16 MED ORDER — PRENATAL MULTIVITAMIN CH
1.0000 | ORAL_TABLET | Freq: Every day | ORAL | Status: DC
  Administered 2016-08-16 – 2016-08-17 (×2): 1 via ORAL
  Filled 2016-08-16 (×3): qty 1

## 2016-08-16 MED ORDER — MISOPROSTOL 200 MCG PO TABS
ORAL_TABLET | ORAL | Status: AC
  Filled 2016-08-16: qty 4

## 2016-08-16 MED ORDER — NALBUPHINE HCL 10 MG/ML IJ SOLN: 5.0000 mg | Freq: Once | INTRAMUSCULAR | Status: DC | PRN

## 2016-08-16 MED ORDER — HYDROMORPHONE HCL 1 MG/ML IJ SOLN
INTRAMUSCULAR | Status: AC
  Filled 2016-08-16: qty 1

## 2016-08-16 MED ORDER — AMMONIA AROMATIC IN INHA
RESPIRATORY_TRACT | Status: AC
  Filled 2016-08-16: qty 10

## 2016-08-16 MED ORDER — SIMETHICONE 80 MG PO CHEW: 80.0000 mg | CHEWABLE_TABLET | ORAL | Status: DC | PRN

## 2016-08-16 MED ORDER — FENTANYL 2.5 MCG/ML W/ROPIVACAINE 0.2% IN NS 100 ML EPIDURAL INFUSION (ARMC-ANES): 10.0000 mL/h | EPIDURAL | Status: DC

## 2016-08-16 MED ORDER — SODIUM CHLORIDE 0.9% FLUSH: 3.0000 mL | INTRAVENOUS | Status: DC | PRN

## 2016-08-16 MED ORDER — HYDROMORPHONE HCL 1 MG/ML IJ SOLN
0.5000 mg | Freq: Once | INTRAMUSCULAR | Status: AC
  Administered 2016-08-16: 0.5 mg via INTRAVENOUS
  Filled 2016-08-16: qty 1

## 2016-08-16 MED ORDER — OXYTOCIN 40 UNITS IN LACTATED RINGERS INFUSION - SIMPLE MED
1.0000 m[IU]/min | INTRAVENOUS | Status: DC
  Administered 2016-08-16: 1 m[IU]/min via INTRAVENOUS
  Filled 2016-08-16: qty 1000

## 2016-08-16 MED ORDER — COCONUT OIL OIL
1.0000 "application " | TOPICAL_OIL | Status: DC | PRN
  Filled 2016-08-16: qty 120

## 2016-08-16 MED ORDER — NALOXONE HCL 2 MG/2ML IJ SOSY
1.0000 ug/kg/h | PREFILLED_SYRINGE | INTRAVENOUS | Status: DC | PRN
  Filled 2016-08-16: qty 2

## 2016-08-16 MED ORDER — LIDOCAINE HCL (PF) 1 % IJ SOLN
INTRAMUSCULAR | Status: AC
  Filled 2016-08-16: qty 30

## 2016-08-16 MED ORDER — DIBUCAINE 1 % RE OINT: 1.0000 "application " | TOPICAL_OINTMENT | RECTAL | Status: DC | PRN

## 2016-08-16 MED ORDER — ONDANSETRON HCL 4 MG/2ML IJ SOLN: 4.0000 mg | INTRAMUSCULAR | Status: DC | PRN

## 2016-08-16 MED ORDER — SENNOSIDES-DOCUSATE SODIUM 8.6-50 MG PO TABS
2.0000 | ORAL_TABLET | ORAL | Status: DC
  Administered 2016-08-17 – 2016-08-18 (×2): 2 via ORAL
  Filled 2016-08-16 (×2): qty 2

## 2016-08-16 MED ORDER — ACETAMINOPHEN 325 MG PO TABS: 650.0000 mg | ORAL_TABLET | ORAL | Status: DC | PRN

## 2016-08-16 NOTE — Clinical Social Work Note (Signed)
CSW received consult for drug exposed baby. CSW is following and will assess when appropriate (mother has time to rest post-delivery).  Santiago Bumpers, MSW, LCSW-A [phone]

## 2016-08-16 NOTE — Anesthesia Procedure Notes (Signed)
Epidural Patient location during procedure: OB Start time: 08/16/2016 5:51 AM End time: 08/16/2016 6:17 AM  Staffing Anesthesiologist: Emmie Niemann Performed: anesthesiologist   Preanesthetic Checklist Completed: patient identified, site marked, surgical consent, pre-op evaluation, timeout performed, IV checked, risks and benefits discussed and monitors and equipment checked  Epidural Patient position: sitting Prep: ChloraPrep Patient monitoring: heart rate, continuous pulse ox and blood pressure Approach: midline Location: L4-L5 Injection technique: LOR saline  Needle:  Needle type: Tuohy  Needle gauge: 18 G Needle length: 9 cm and 9 Catheter type: closed end flexible Catheter size: 20 Guage Test dose: negative (0.125% bupivacaine)  Assessment Events: blood not aspirated, injection not painful, no injection resistance, negative IV test and no paresthesia  Additional Notes   Patient tolerated the insertion well without complications.Reason for block:procedure for pain

## 2016-08-16 NOTE — Anesthesia Preprocedure Evaluation (Signed)
Anesthesia Evaluation  Patient identified by MRN, date of birth, ID band Patient awake    Reviewed: Allergy & Precautions, NPO status , Patient's Chart, lab work & pertinent test results  History of Anesthesia Complications Negative for: history of anesthetic complications  Airway Mallampati: II  TM Distance: >3 FB Neck ROM: Full    Dental no notable dental hx.    Pulmonary neg sleep apnea, neg COPD, Current Smoker,    breath sounds clear to auscultation- rhonchi (-) wheezing      Cardiovascular Exercise Tolerance: Good (-) hypertension(-) CAD and (-) Past MI  Rhythm:Regular Rate:Normal - Systolic murmurs and - Diastolic murmurs    Neuro/Psych Anxiety Depression negative neurological ROS     GI/Hepatic negative GI ROS, Neg liver ROS,   Endo/Other  negative endocrine ROSneg diabetes  Renal/GU negative Renal ROS     Musculoskeletal   Abdominal Gravid abdomen  Peds  Hematology negative hematology ROS (+)   Anesthesia Other Findings Past Medical History: No date: Anxiety No date: Depression No date: Fibroid No date: History of D&C 11/2013: History of uterine fibroid     Comment: [facility]   Reproductive/Obstetrics (+) Pregnancy                             Anesthesia Physical Anesthesia Plan  ASA: II  Anesthesia Plan: Epidural   Post-op Pain Management:    Induction:   Airway Management Planned:   Additional Equipment:   Intra-op Plan:   Post-operative Plan:   Informed Consent: I have reviewed the patients History and Physical, chart, labs and discussed the procedure including the risks, benefits and alternatives for the proposed anesthesia with the patient or authorized representative who has indicated his/her understanding and acceptance.     Plan Discussed with: Anesthesiologist  Anesthesia Plan Comments:         Lab Results  Component Value Date   WBC 14.4 (H)  08/15/2016   HGB 12.8 08/15/2016   HCT 37.4 08/15/2016   MCV 90.9 08/15/2016   PLT 183 08/15/2016    Anesthesia Quick Evaluation
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
## 2016-08-17 LAB — CBC
HCT: 31 % — ABNORMAL LOW (ref 35.0–47.0)
Hemoglobin: 10.6 g/dL — ABNORMAL LOW (ref 12.0–16.0)
MCH: 31.7 pg (ref 26.0–34.0)
MCHC: 34.3 g/dL (ref 32.0–36.0)
MCV: 92.5 fL (ref 80.0–100.0)
Platelets: 164 10*3/uL (ref 150–440)
RBC: 3.36 MIL/uL — ABNORMAL LOW (ref 3.80–5.20)
RDW: 13.5 % (ref 11.5–14.5)
WBC: 15.1 10*3/uL — ABNORMAL HIGH (ref 3.6–11.0)

## 2016-08-17 LAB — RPR: RPR Ser Ql: NONREACTIVE

## 2016-08-17 MED ORDER — IBUPROFEN 600 MG PO TABS
600.0000 mg | ORAL_TABLET | Freq: Four times a day (QID) | ORAL | Status: DC
  Administered 2016-08-18 (×2): 600 mg via ORAL
  Filled 2016-08-17 (×2): qty 1

## 2016-08-17 NOTE — Progress Notes (Signed)
Post Partum Day # 1, s/p SVD  Subjective: no complaints, up ad lib, voiding and tolerating PO  Objective: Temp:  [98.2 F (36.8 C)-98.7 F (37.1 C)] 98.2 F (36.8 C) (11/27 0755) Pulse Rate:  [76-124] 76 (11/27 0755) Resp:  [17-20] 20 (11/27 0755) BP: (102-153)/(52-99) 114/79 (11/27 0755) SpO2:  [97 %-100 %] 100 % (11/27 0755)  Physical Exam:  General: alert and no distress  Lungs: clear to auscultation bilaterally Breasts: normal appearance, no masses or tenderness Heart: regular rate and rhythm, S1, S2 normal, no murmur, click, rub or gallop Pelvis: Lochia: appropriate, Uterine Fundus: firm Extremities: DVT Evaluation: No evidence of DVT seen on physical exam. Negative Homan's sign. No cords or calf tenderness. No significant calf/ankle edema.   Recent Labs  08/15/16 2101 08/17/16 0147  HGB 12.8 10.6*  HCT 37.4 31.0*    Assessment/Plan: Plan for discharge tomorrow, Breastfeeding and Contraception OCPs  Infant under bilirubin lights for jaundice.     LOS: 2 days   [name] [facility]

## 2016-08-17 NOTE — Clinical Social Work Maternal (Signed)
  CLINICAL SOCIAL WORK MATERNAL/CHILD NOTE  Patient Details  Name: Vanessa Franco MRN: [id number] Date of Birth: 1981-08-02  Date:  08/17/2016  Clinical Social Worker Initiating Note:  Shela Leff MSW,LCSW Date/ Time Initiated:  08/17/16/      Child's Name:      Legal Guardian:  Mother   Need for Interpreter:  None   Date of Referral:        Reason for Referral:  Current Substance Use/Substance Use During Pregnancy    Referral Source:  RN   Address:     Phone number:      Household Members:  Friends   Natural Supports (not living in the home):      Professional Supports: None   Employment:     Type of Work:     Education:      Pensions consultant:  [employer]   Other Resources:  Physicist, medical , [place] Considerations Which May Impact Care:  none  Strengths:  Ability to meet basic needs , Home prepared for child    Risk Factors/Current Problems:  Substance Use    Cognitive State:      Mood/Affect:  Constricted , Calm    CSW Assessment: CSW consulted due to patient's mother testing positive for marijuana. Patient's newborn's urine drug screen was negative but cord results are pending. CSW met with patient this afternoon and explained the role and purpose of visit. CSW asked permission to talk freely in front of her guests and patient gave permission. Patient reports that this is her first child. Patient reports that she has all necessities for her newborn and that she also has transportation. Patient stated that her friend that was in the room would be living with her. Patient denied any history of mental illness and CSW provided education on postpartum depression. CSW asked about her marijuana use and patient's demeanor changed. She rolled her eyes and stated "but my baby tested negative." CSW explained that the cord results were still pending and that if the cord results were positive that a DSS CPS report would need to be made. Patient said  "that's fine" and looked away. CSW updated nurse regarding visit.  CSW Plan/Description:  Engineer, mining , Psychosocial Support and Ongoing Assessment of Needs    Shela Leff, [place] 08/17/2016, 3:04 PM

## 2016-08-17 NOTE — Anesthesia Postprocedure Evaluation (Signed)
Anesthesia Post Note  Patient: Vanessa Franco  Procedure(s) Performed: * No procedures listed *  Patient location during evaluation: Mother Baby Anesthesia Type: Epidural Level of consciousness: awake and alert, oriented and patient cooperative Pain management: pain level controlled Vital Signs Assessment: post-procedure vital signs reviewed and stable Respiratory status: spontaneous breathing, nonlabored ventilation and respiratory function stable Cardiovascular status: stable Postop Assessment: no headache, no backache and epidural receding Anesthetic complications: no    Last Vitals:  Vitals:   08/17/16 0351 08/17/16 0538  BP: (!) 102/52 129/84  Pulse: 79 87  Resp: 18   Temp: 36.9 C     Last Pain:  Vitals:   08/17/16 0351  TempSrc: Oral  PainSc:                  Virgilio Frees

## 2016-08-18 MED ORDER — ACETAMINOPHEN ER 650 MG PO TBCR: 650.0000 mg | EXTENDED_RELEASE_TABLET | Freq: Three times a day (TID) | ORAL | 1 refills | Status: DC | PRN

## 2016-08-18 NOTE — Progress Notes (Addendum)
Post Partum Day # 2, s/p SVD  Subjective: no complaints, up ad lib, voiding and tolerating PO  Objective: Blood pressure 103/72, pulse 78, temperature 98.7 F (37.1 C), temperature source Oral, resp. rate 20, height 5\' 7"  (1.702 m), weight 196 lb (88.9 kg), last menstrual period 11/16/2015, SpO2 100 %, unknown if currently breastfeeding.  Physical Exam:  General: alert and no distress  Lungs: clear to auscultation bilaterally Breasts: normal appearance, no masses or tenderness Heart: regular rate and rhythm, S1, S2 normal, no murmur, click, rub or gallop Pelvis: Lochia: appropriate, Uterine Fundus: firm Extremities: DVT Evaluation: No evidence of DVT seen on physical exam. Negative Homan's sign. No cords or calf tenderness. No significant calf/ankle edema.   Recent Labs  08/15/16 2101 08/17/16 0147  HGB 12.8 10.6*  HCT 37.4 31.0*    Assessment/Plan: Continue routine care S/p Social Work consult for + UDS (marijuana) Contraception OCPs  Desires to breastfeed but currently bottle feeding due to recent h/o marijuana use.  Infant continuing bilirubin lights for jaundice.  Discharge home today.    LOS: 3 days   [place]

## 2016-08-18 NOTE — Progress Notes (Addendum)
D/C instructions provided, pt states understanding, aware of follow up appt.  Pt refuses MMR vaccine.

## 2016-08-18 NOTE — Discharge Instructions (Signed)

## 2016-08-20 ENCOUNTER — Encounter: Payer: Medicaid Other | Admitting: Obstetrics and Gynecology

## 2016-08-20 ENCOUNTER — Other Ambulatory Visit: Payer: Self-pay

## 2016-08-20 ENCOUNTER — Encounter: Payer: Self-pay | Admitting: Obstetrics and Gynecology

## 2016-08-20 DIAGNOSIS — R52 Pain, unspecified: Secondary | ICD-10-CM

## 2016-08-20 MED ORDER — IBUPROFEN 800 MG PO TABS: 800.0000 mg | ORAL_TABLET | Freq: Three times a day (TID) | ORAL | 1 refills | Status: DC | PRN

## 2016-09-28 NOTE — Progress Notes (Deleted)
   OBSTETRICS POSTPARTUM CLINIC PROGRESS NOTE  Subjective:     Vanessa Franco is a 36 y.o. G86P1021 female who presents for a postpartum visit. She is 6 weeks postpartum following a spontaneous vaginal delivery. I have fully reviewed the prenatal and intrapartum course. The delivery was at 65 gestational weeks.  Anesthesia: epidural. Postpartum course has been ***. Baby's course has been ***. Baby is feeding by {breast/bottle:69}. Bleeding: patient has/has not resumed menses, with No LMP recorded.. Bowel function is {normal:32111}. Bladder function is {normal:32111}. Patient {is/is not:9024} sexually active. Contraception method desired is {contraceptive method:5051}. Postpartum depression screening: {neg default:13464::"negative"}.  {Common ambulatory SmartLinks:19316}  Review of Systems {ros; complete:30496}   Objective:    There were no vitals taken for this visit.  General:  alert and no distress   Breasts:  inspection negative, no nipple discharge or bleeding, no masses or nodularity palpable  Lungs: clear to auscultation bilaterally  Heart:  regular rate and rhythm, S1, S2 normal, no murmur, click, rub or gallop  Abdomen: soft, non-tender; bowel sounds normal; no masses,  no organomegaly.  ***Well healed Pfannenstiel incision   Vulva:  normal  Vagina: normal vagina, no discharge, exudate, lesion, or erythema  Cervix:  no cervical motion tenderness and no lesions  Corpus: normal size, contour, position, consistency, mobility, non-tender  Adnexa:  normal adnexa and no mass, fullness, tenderness  Rectal Exam: Not performed.         Labs:  Lab Results  Component Value Date   HGB 10.6 (L) 08/17/2016     Assessment:    *** postpartum exam.   @DIAGNOSES @   Plan:    1. Contraception: {method:5051} 2. Will check Hgb for h/o anemia.  3. Follow up in: {1-10:13787} {time; units:19136} or as needed.    Gordy Clement, [place] [facility]

## 2016-09-29 ENCOUNTER — Encounter: Payer: Medicaid Other | Admitting: Obstetrics and Gynecology

## 2016-09-29 ENCOUNTER — Ambulatory Visit (INDEPENDENT_AMBULATORY_CARE_PROVIDER_SITE_OTHER): Payer: Medicaid Other | Admitting: Obstetrics and Gynecology

## 2016-09-29 ENCOUNTER — Encounter: Payer: Self-pay | Admitting: Obstetrics and Gynecology

## 2016-09-29 DIAGNOSIS — O9081 Anemia of the puerperium: Secondary | ICD-10-CM

## 2016-09-29 DIAGNOSIS — Z3009 Encounter for other general counseling and advice on contraception: Secondary | ICD-10-CM

## 2016-09-29 DIAGNOSIS — Z720 Tobacco use: Secondary | ICD-10-CM

## 2016-09-29 MED ORDER — NORETHINDRONE 0.35 MG PO TABS
1.0000 | ORAL_TABLET | Freq: Every day | ORAL | 11 refills | Status: DC
Start: 2016-09-29 — End: 2017-03-16

## 2016-09-29 MED ORDER — PROVIDA OB 20-20-1.25 MG PO CAPS: 1.0000 | ORAL_CAPSULE | Freq: Every day | ORAL | 3 refills | Status: DC

## 2016-09-29 NOTE — Progress Notes (Signed)
   OBSTETRICS POSTPARTUM CLINIC PROGRESS NOTE  Subjective:     Vanessa Franco is a 36 y.o. G39P1021 female who presents for a postpartum visit. She is 6 weeks postpartum following a spontaneous vaginal delivery. I have fully reviewed the prenatal and intrapartum course. The delivery was at 94 gestational weeks.  Anesthesia: epidural. Postpartum course has been well. Baby's course has been well. Baby is feeding by bottle - Similac Advance. Bleeding: patient has resumed menses, with Patient's last menstrual period was 09/25/2016.Mar[place] Kitchen Bowel function is normal. Bladder function is normal. Patient is not sexually active. Contraception method desired is undecided. Postpartum depression screening: negative PHQ-9 score 1.  The following portions of the patient's history were reviewed and updated as appropriate: allergies, current medications, past family history, past medical history, past social history, past surgical history and problem list.  Review of Systems Pertinent items noted in HPI and remainder of comprehensive ROS otherwise negative.   Objective:    BP 119/71 (BP Location: Left Arm, Patient Position: Sitting, Cuff Size: Normal)   Pulse 75   Ht 5\' 9"  (1.753 m)   Wt 181 lb 4.8 oz (82.2 kg)   LMP 09/25/2016   Breastfeeding? No   BMI 26.77 kg/m   General:  alert and no distress   Breasts:  inspection negative, no nipple discharge or bleeding, no masses or nodularity palpable  Lungs: clear to auscultation bilaterally  Heart:  regular rate and rhythm, S1, S2 normal, no murmur, click, rub or gallop  Abdomen: soft, non-tender; bowel sounds normal; no masses,  no organomegaly.     Vulva:  normal  Vagina: normal vagina, no discharge, exudate, lesion, or erythema  Cervix:  no cervical motion tenderness and no lesions  Corpus: normal size, contour, position, consistency, mobility, non-tender  Adnexa:  normal adnexa and no mass, fullness, tenderness  Rectal Exam: Not performed.         Labs:   Lab Results  Component Value Date   HGB 10.6 (L) 08/17/2016     Assessment:    Routine postpartum exam.   Mild anemia, postpartum Tobacco abuse   Plan:   1. Contraception: Reviewed all forms of birth control options available including abstinence; over the counter/barrier methods; hormonal contraceptive medication including pill, patch, ring, injection,contraceptive implant; hormonal and nonhormonal IUDs; permanent sterilization options including vasectomy and the various tubal sterilization modalities. Risks and benefits reviewed.  Questions were answered. Discussed increased risks for DVT/strokes due to tobacco use.  Strongly recommend progesterone only method at this time, until smoking cessation occurs. Patient will try progesterone-only OCP.  Can perform Sunday start after most recent menses.  2. Hgb over 10 postpartum, no need to recheck Hgb for h/o anemia. Patient has been taking prenatals with iron.  3. Tobacco counseling again performed today.  Encouraged cessation. Notes that she does not smoke around infant.  3. Follow up in: 3-6 months with [facility] or PCP for annual exam, or as needed.    [name], MD [facility]  Gordy Clement, [place] [facility]

## 2017-01-27 ENCOUNTER — Encounter: Payer: Medicaid Other | Admitting: Obstetrics and Gynecology

## 2017-03-16 ENCOUNTER — Encounter: Payer: Self-pay | Admitting: Obstetrics and Gynecology

## 2017-03-16 ENCOUNTER — Ambulatory Visit (INDEPENDENT_AMBULATORY_CARE_PROVIDER_SITE_OTHER): Payer: Medicaid Other | Admitting: Obstetrics and Gynecology

## 2017-03-16 VITALS — BP 130/79 | HR 78 | Ht 69.0 in | Wt 180.9 lb

## 2017-03-16 DIAGNOSIS — Z716 Tobacco abuse counseling: Secondary | ICD-10-CM | POA: Diagnosis not present

## 2017-03-16 DIAGNOSIS — Z01419 Encounter for gynecological examination (general) (routine) without abnormal findings: Secondary | ICD-10-CM | POA: Diagnosis not present

## 2017-03-16 DIAGNOSIS — Z30011 Encounter for initial prescription of contraceptive pills: Secondary | ICD-10-CM | POA: Diagnosis not present

## 2017-03-16 DIAGNOSIS — N760 Acute vaginitis: Secondary | ICD-10-CM

## 2017-03-16 DIAGNOSIS — Z720 Tobacco use: Secondary | ICD-10-CM

## 2017-03-16 DIAGNOSIS — R030 Elevated blood-pressure reading, without diagnosis of hypertension: Secondary | ICD-10-CM

## 2017-03-16 MED ORDER — NORETHINDRONE 0.35 MG PO TABS: 1.0000 | ORAL_TABLET | Freq: Every day | ORAL | 11 refills | Status: DC

## 2017-03-16 MED ORDER — FLUCONAZOLE 150 MG PO TABS: 150.0000 mg | ORAL_TABLET | ORAL | 0 refills | Status: DC

## 2017-03-16 MED ORDER — BUPROPION HCL ER (SMOKING DET) 150 MG PO TB12: 150.0000 mg | ORAL_TABLET | Freq: Two times a day (BID) | ORAL | 3 refills | Status: DC

## 2017-03-16 NOTE — Progress Notes (Signed)
GYNECOLOGY ANNUAL PHYSICAL EXAM PROGRESS NOTE  Subjective:    Vanessa Franco is a 36 y.o. G42P1021 female who presents for an annual exam. The patient is not currently sexually active. The patient wears seatbelts: yes. The patient participates in regular exercise: no. Has the patient ever been transfused or tattooed?: no. The patient reports that there is not domestic violence in her life.    The patient has the following concerns today: 1. Patient desires to discuss smoking cessation. She currently smokes ~ 1 pack every 1-2 days.   2. Patient complains of h/o recurrent yeast infections.  Notes that she has had to be treated several times within the past 4 months for recurrent yeast ([facility]).  States that she has not changed anything in her diet, is using mild soap and detergent. Is not currently sexually active. Is currently taking Monistat this week. 3. Patient desires to resume contraception.  Notes that she was on progesterone OCPs, but after forgetting several doses she discontinued use ~ 1-2 months after initiation last year.  4.  Patient notes that she has been noticing elevated blood pressures at home.  Notes that she sometimes feels lightheaded, and checks her BP at home and sometimes it is high (140s/90s).    Gynecologic History Menarche age: 39 Patient's last menstrual period was 02/23/2017. Contraception: none. History of STI's: Denies  Last Pap:2015 . Results were: normal.  Denies h/o abnormal pap smears.   Obstetric History   G3   P1   T1   P0   A2   L1    SAB2   TAB0   Ectopic0   Multiple0   Live Births1     # Outcome Date GA Lbr Len/2nd Weight Sex Delivery Anes PTL Lv  3 Term 08/16/16 [redacted]w[redacted]d 09:15 / 01:20 5 lb 15.9 oz (2.72 kg) F Vag-Spont EPI  LIV     Name: Vanessa Franco     Apgar1:  8                Apgar5: 9  2 SAB 2002        FD  1 SAB 2001        FD      Past Medical History:  Diagnosis Date  . Anxiety     . Depression   . Fibroid   . History of D&C   . History of uterine fibroid 11/2013   [facility]    Past Surgical History:  Procedure Laterality Date  . DILATION AND CURETTAGE OF UTERUS    . MYOMECTOMY  2015   Hysteroscopic    Family History  Problem Relation Age of Onset  . Diabetes Father   . Kidney failure Father   . Heart failure Father   . Rheum arthritis Mother   . Hypertension Mother     Social History   Social History  . Marital status: Single    Spouse name: N/A  . Number of children: N/A  . Years of education: N/A   Occupational History  . sales [employer]   Social History Main Topics  . Smoking status: Current Some Day Smoker    Packs/day: 0.25  . Smokeless tobacco: Never Used  . Alcohol use No     Comment: rare  . Drug use: Yes    Types: Marijuana  . Sexual activity: Not Currently    Partners: Male    Birth control/ protection: None  Other Topics Concern  . Not on file   Social History Narrative  . No narrative on file    Current Outpatient Prescriptions on File Prior to Visit  Medication Sig Dispense Refill  . Omega-3 Fatty Acids (FISH OIL) 1000 MG CAPS Take by mouth.    . Prenatal Vit-Fe Fumarate-FA (PRENATAL MULTIVITAMIN) TABS tablet Take 1 tablet by mouth daily at 12 noon.     No current facility-administered medications on file prior to visit.     Allergies  Allergen Reactions  . Ketorolac Other (See Comments)    Swelling, water retention  . Toradol [Ketorolac Tromethamine] Swelling  . Sulfa Antibiotics Rash     Review of Systems Constitutional: negative for chills, fatigue, fevers and sweats Eyes: negative for irritation, redness and visual disturbance Ears, nose, mouth, throat, and face: negative for hearing loss, nasal congestion, snoring and tinnitus Respiratory: negative for asthma, cough, sputum Cardiovascular: negative for chest pain, dyspnea, exertional chest pressure/discomfort, irregular heart beat,  palpitations and syncope Gastrointestinal: negative for abdominal pain, change in bowel habits, nausea and vomiting Genitourinary: Positive for recurrent yeast infections. Negative for abnormal menstrual periods, genital lesions, sexual problems and vaginal discharge, dysuria and urinary incontinence Integument/breast: negative for breast lump, breast tenderness and nipple discharge Hematologic/lymphatic: negative for bleeding and easy bruising Musculoskeletal:negative for back pain and muscle weakness Neurological: negative for dizziness, headaches, vertigo and weakness Endocrine: negative for diabetic symptoms including polydipsia, polyuria and skin dryness Allergic/Immunologic: negative for hay fever and urticaria        Objective:  Blood pressure 130/79, pulse 78, height 5\' 9"  (1.753 m), weight 180 lb 14.4 oz (82.1 kg), last menstrual period 02/23/2017, not currently breastfeeding. Body mass index is 26.71 kg/m.  General Appearance:    Alert, cooperative, no distress, appears stated age, overweight  Head:    Normocephalic, without obvious abnormality, atraumatic  Eyes:    PERRL, conjunctiva/corneas clear, EOM's intact, both eyes  Ears:    Normal external ear canals, both ears  Nose:   Nares normal, septum midline, mucosa normal, no drainage or sinus tenderness  Throat:   Lips, mucosa, and tongue normal; teeth and gums normal  Neck:   Supple, symmetrical, trachea midline, no adenopathy; thyroid: no enlargement/tenderness/nodules; no carotid bruit or JVD  Back:     Symmetric, no curvature, ROM normal, no CVA tenderness  Lungs:     Clear to auscultation bilaterally, respirations unlabored  Chest Wall:    No tenderness or deformity   Heart:    Regular rate and rhythm, S1 and S2 normal, no murmur, rub or gallop  Breast Exam:    No tenderness, masses, or nipple abnormality  Abdomen:     Soft, non-tender, bowel sounds active all four quadrants, no masses, no organomegaly.    Genitalia:     Pelvic:external genitalia normal, vagina without lesions or tenderness, rectovaginal septum normal. Residual vaginal cream in vault, no discernable discharge present.  Cervix normal in appearance, no cervical motion tenderness, no adnexal masses or tenderness.  Uterus normal size, shape, mobile, regular contours, nontender.  Rectal:    Normal external sphincter.  No hemorrhoids appreciated. Internal exam not done.   Extremities:   Extremities normal, atraumatic, no cyanosis or edema  Pulses:   2+ and symmetric all extremities  Skin:   Skin color, texture, turgor normal, no rashes or lesions  Lymph nodes:   Cervical, supraclavicular, and axillary nodes normal  Neurologic:   CNII-XII intact, normal strength, sensation and reflexes throughout  Labs:  Lab Results  Component Value Date   WBC 15.1 (H) 08/17/2016   HGB 10.6 (L) 08/17/2016   HCT 31.0 (L) 08/17/2016   MCV 92.5 08/17/2016   PLT 164 08/17/2016    Lab Results  Component Value Date   CREATININE 0.41 (L) 03/19/2016   BUN 8 03/19/2016   NA 135 03/19/2016   K 3.4 (L) 03/19/2016   CL 103 03/19/2016   CO2 25 03/19/2016    Lab Results  Component Value Date   ALT 41 03/19/2016   AST 41 03/19/2016   ALKPHOS 51 03/19/2016   BILITOT 0.7 03/19/2016    No results found for: TSH   Assessment:   Healthy female exam.  Recurrent yeast vaginitis Tobacco abuse, desiring smoking cessation Elevated BPs Contraception management  Plan:    - Blood tests: CBC with diff and Comprehensive metabolic panel. - Breast self exam technique reviewed and patient encouraged to perform self-exam monthly. - Contraception: currently on no contraception but previously on rogesterone OCPs.  Discussed all options for contraception.  Patient notes that she would like to try the pills again. Explained that if she misses a dose, she should take the next dose as soon as possible instead of discontinuing the pack.  Should also use a back up method if  more than 2 pills missed. Patient notes understanding. - Discussed healthy lifestyle modifications. - Pap smear performed today.   - Smoking cessation instruction/counseling given:  counseled patient on the dangers of tobacco use, advised patient to stop smoking, and reviewed strategies to maximize success, including removing cigarettes and smoking materials from environment, stress management, support of family/friends and pharmacotherapy (Wellbutrin, Chantix, Nicotine patches, or gum).  Patient notes she would like to try Wellbutrin (as this is covered through [facility]).  Will prescribe.  Patient to set quit date. Should have discontinued smoking within 12 weeks of starting medication.  Will f/u in 6 weeks to assess progress.  - Recurrent vaginitis (yeast): reiterated eating a healthy diet rich in fruits and vegetables and low in sugar and refined carbohydrates. Also advised to support immune system: reduce stress, get adequate sleep and exercise regularly.  Advised on consumption of yogurt, and/or probiotic. Encouraged to avoid harsh soaps and detergents.  - Elevated BPs: BP normal today, however patient notes elevated BPs sometimes at home. Advised to keep a log.  Also encouraged managing diet (low-sodium) and exercise for at least 30 min 3-5 times weekly.     [name], MD [facility]

## 2017-03-16 NOTE — Patient Instructions (Addendum)
Bupropion sustained-release tablets (smoking cessation) What is this medicine? BUPROPION (byoo PROE pee on) is used to help people quit smoking. This medicine may be used for other purposes; ask your health care provider or pharmacist if you have questions. COMMON BRAND NAME(S): Buproban, Zyban What should I tell my health care provider before I take this medicine? They need to know if you have any of these conditions: -an eating disorder, such as anorexia or bulimia -bipolar disorder or psychosis -diabetes or high blood sugar, treated with medication -glaucoma -head injury or brain tumor -heart disease, previous heart attack, or irregular heart beat -high blood pressure -kidney or liver disease -seizures -suicidal thoughts or a previous suicide attempt -Tourette's syndrome -weight loss -an unusual or allergic reaction to bupropion, other medicines, foods, dyes, or preservatives -breast-feeding -pregnant or trying to become pregnant How should I use this medicine? Take this medicine by mouth with a glass of water. Follow the directions on the prescription label. You can take it with or without food. If it upsets your stomach, take it with food. Do not cut, crush or chew this medicine. Take your medicine at regular intervals. If you take this medicine more than once a day, take your second dose at least 8 hours after you take your first dose. To limit difficulty in sleeping, avoid taking this medicine at bedtime. Do not take your medicine more often than directed. Do not stop taking this medicine suddenly except upon the advice of your doctor. Stopping this medicine too quickly may cause serious side effects. A special MedGuide will be given to you by the pharmacist with each prescription and refill. Be sure to read this information carefully each time. Talk to your pediatrician regarding the use of this medicine in children. Special care may be needed. Overdosage: If you think you have  taken too much of this medicine contact a poison control center or emergency room at once. NOTE: This medicine is only for you. Do not share this medicine with others. What if I miss a dose? If you miss a dose, skip the missed dose and take your next tablet at the regular time. There should be at least 8 hours between doses. Do not take double or extra doses. What may interact with this medicine? Do not take this medicine with any of the following medications: -linezolid -MAOIs like Azilect, Carbex, Eldepryl, Marplan, Nardil, and Parnate -methylene blue (injected into a vein) -other medicines that contain bupropion like Wellbutrin This medicine may also interact with the following medications: -alcohol -certain medicines for anxiety or sleep -certain medicines for blood pressure like metoprolol, propranolol -certain medicines for depression or psychotic disturbances -certain medicines for HIV or AIDS like efavirenz, lopinavir, nelfinavir, ritonavir -certain medicines for irregular heart beat like propafenone, flecainide -certain medicines for Parkinson's disease like amantadine, levodopa -certain medicines for seizures like carbamazepine, phenytoin, phenobarbital -cimetidine -clopidogrel -cyclophosphamide -digoxin -furazolidone -isoniazid -nicotine -orphenadrine -procarbazine -steroid medicines like prednisone or cortisone -stimulant medicines for attention disorders, weight loss, or to stay awake -tamoxifen -theophylline -thiotepa -ticlopidine -tramadol -warfarin This list may not describe all possible interactions. Give your health care provider a list of all the medicines, herbs, non-prescription drugs, or dietary supplements you use. Also tell them if you smoke, drink alcohol, or use illegal drugs. Some items may interact with your medicine. What should I watch for while using this medicine? Visit your doctor or health care professional for regular checks on your progress.  This medicine should be used together with a  patient support program. It is important to participate in a behavioral program, counseling, or other support program that is recommended by your health care professional. Patients and their families should watch out for new or worsening thoughts of suicide or depression. Also watch out for sudden changes in feelings such as feeling anxious, agitated, panicky, irritable, hostile, aggressive, impulsive, severely restless, overly excited and hyperactive, or not being able to sleep. If this happens, especially at the beginning of treatment or after a change in dose, call your health care professional. Avoid alcoholic drinks while taking this medicine. Drinking excessive alcoholic beverages, using sleeping or anxiety medicines, or quickly stopping the use of these agents while taking this medicine may increase your risk for a seizure. Do not drive or use heavy machinery until you know how this medicine affects you. This medicine can impair your ability to perform these tasks. Do not take this medicine close to bedtime. It may prevent you from sleeping. Your mouth may get dry. Chewing sugarless gum or sucking hard candy, and drinking plenty of water may help. Contact your doctor if the problem does not go away or is severe. Do not use nicotine patches or chewing gum without the advice of your doctor or health care professional while taking this medicine. You may need to have your blood pressure taken regularly if your doctor recommends that you use both nicotine and this medicine together. What side effects may I notice from receiving this medicine? Side effects that you should report to your doctor or health care professional as soon as possible: -allergic reactions like skin rash, itching or hives, swelling of the face, lips, or tongue -breathing problems -changes in vision -confusion -elevated mood, decreased need for sleep, racing thoughts, impulsive  behavior -fast or irregular heartbeat -hallucinations, loss of contact with reality -increased blood pressure -redness, blistering, peeling or loosening of the skin, including inside the mouth -seizures -suicidal thoughts or other mood changes -unusually weak or tired -vomiting Side effects that usually do not require medical attention (report to your doctor or health care professional if they continue or are bothersome): -constipation -headache -loss of appetite -nausea -tremors -weight loss This list may not describe all possible side effects. Call your doctor for medical advice about side effects. You may report side effects to FDA at 1-800-FDA-1088. Where should I keep my medicine? Keep out of the reach of children. Store at room temperature between 20 and 25 degrees C (68 and 77 degrees F). Protect from light. Keep container tightly closed. Throw away any unused medicine after the expiration date. NOTE: This sheet is a summary. It may not cover all possible information. If you have questions about this medicine, talk to your doctor, pharmacist, or health care provider.  2018 Elsevier/Gold Standard (2016-02-28 13:49:28)  Steps to Quit Smoking Smoking tobacco can be harmful to your health and can affect almost every organ in your body. Smoking puts you, and those around you, at risk for developing many serious chronic diseases. Quitting smoking is difficult, but it is one of the best things that you can do for your health. It is never too late to quit. What are the benefits of quitting smoking? When you quit smoking, you lower your risk of developing serious diseases and conditions, such as:  Lung cancer or lung disease, such as COPD.  Heart disease.  Stroke.  Heart attack.  Infertility.  Osteoporosis and bone fractures.  Additionally, symptoms such as coughing, wheezing, and shortness of breath may get better  when you quit. You may also find that you get sick less often  because your body is stronger at fighting off colds and infections. If you are pregnant, quitting smoking can help to reduce your chances of having a baby of low birth weight. How do I get ready to quit? When you decide to quit smoking, create a plan to make sure that you are successful. Before you quit:  Pick a date to quit. Set a date within the next two weeks to give you time to prepare.  Write down the reasons why you are quitting. Keep this list in places where you will see it often, such as on your bathroom mirror or in your car or wallet.  Identify the people, places, things, and activities that make you want to smoke (triggers) and avoid them. Make sure to take these actions: ? Throw away all cigarettes at home, at work, and in your car. ? Throw away smoking accessories, such as Set designer. ? Clean your car and make sure to empty the ashtray. ? Clean your home, including curtains and carpets.  Tell your family, friends, and coworkers that you are quitting. Support from your loved ones can make quitting easier.  Talk with your health care provider about your options for quitting smoking.  Find out what treatment options are covered by your health insurance.  What strategies can I use to quit smoking? Talk with your healthcare provider about different strategies to quit smoking. Some strategies include:  Quitting smoking altogether instead of gradually lessening how much you smoke over a period of time. Research shows that quitting "cold [place]" is more successful than gradually quitting.  Attending in-person counseling to help you build problem-solving skills. You are more likely to have success in quitting if you attend several counseling sessions. Even short sessions of 10 minutes can be effective.  Finding resources and support systems that can help you to quit smoking and remain smoke-free after you quit. These resources are most helpful when you use them often. They  can include: ? Online chats with a Veterinary surgeon. ? Telephone quitlines. ? Automotive engineer. ? Support groups or group counseling. ? Text messaging programs. ? Mobile phone applications.  Taking medicines to help you quit smoking. (If you are pregnant or breastfeeding, talk with your health care provider first.) Some medicines contain nicotine and some do not. Both types of medicines help with cravings, but the medicines that include nicotine help to relieve withdrawal symptoms. Your health care provider may recommend: ? Nicotine patches, gum, or lozenges. ? Nicotine inhalers or sprays. ? Non-nicotine medicine that is taken by mouth.  Talk with your health care provider about combining strategies, such as taking medicines while you are also receiving in-person counseling. Using these two strategies together makes you more likely to succeed in quitting than if you used either strategy on its own. If you are pregnant or breastfeeding, talk with your health care provider about finding counseling or other support strategies to quit smoking. Do not take medicine to help you quit smoking unless told to do so by your health care provider. What things can I do to make it easier to quit? Quitting smoking might feel overwhelming at first, but there is a lot that you can do to make it easier. Take these important actions:  Reach out to your family and friends and ask that they support and encourage you during this time. Call telephone quitlines, reach out to support groups, or work  with a counselor for support.  Ask people who smoke to avoid smoking around you.  Avoid places that trigger you to smoke, such as bars, parties, or smoke-break areas at work.  Spend time around people who do not smoke.  Lessen stress in your life, because stress can be a smoking trigger for some people. To lessen stress, try: ? Exercising regularly. ? Deep-breathing exercises. ? Yoga. ? Meditating. ? Performing  a body scan. This involves closing your eyes, scanning your body from head to toe, and noticing which parts of your body are particularly tense. Purposefully relax the muscles in those areas.  Download or purchase mobile phone or tablet apps (applications) that can help you stick to your quit plan by providing reminders, tips, and encouragement. There are many free apps, such as QuitGuide from the [employer] Office manager for Disease Control and Prevention). You can find other support for quitting smoking (smoking cessation) through smokefree.gov and other websites.  How will I feel when I quit smoking? Within the first 24 hours of quitting smoking, you may start to feel some withdrawal symptoms. These symptoms are usually most noticeable 2-3 days after quitting, but they usually do not last beyond 2-3 weeks. Changes or symptoms that you might experience include:  Mood swings.  Restlessness, anxiety, or irritation.  Difficulty concentrating.  Dizziness.  Strong cravings for sugary foods in addition to nicotine.  Mild weight gain.  Constipation.  Nausea.  Coughing or a sore throat.  Changes in how your medicines work in your body.  A depressed mood.  Difficulty sleeping (insomnia).  After the first 2-3 weeks of quitting, you may start to notice more positive results, such as:  Improved sense of smell and taste.  Decreased coughing and sore throat.  Slower heart rate.  Lower blood pressure.  Clearer skin.  The ability to breathe more easily.  Fewer sick days.  Quitting smoking is very challenging for most people. Do not get discouraged if you are not successful the first time. Some people need to make many attempts to quit before they achieve long-term success. Do your best to stick to your quit plan, and talk with your health care provider if you have any questions or concerns. This information is not intended to replace advice given to you by your health care provider. Make sure  you discuss any questions you have with your health care provider. Document Released: 09/01/2001 Document Revised: 05/05/2016 Document Reviewed: 01/22/2015 Elsevier Interactive Patient Education  2017 [place] Maintenance, Female Adopting a healthy lifestyle and getting preventive care can go a long way to promote health and wellness. Talk with your health care provider about what schedule of regular examinations is right for you. This is a good chance for you to check in with your provider about disease prevention and staying healthy. In between checkups, there are plenty of things you can do on your own. Experts have done a lot of research about which lifestyle changes and preventive measures are most likely to keep you healthy. Ask your health care provider for more information. Weight and diet Eat a healthy diet  Be sure to include plenty of vegetables, fruits, low-fat dairy products, and lean protein.  Do not eat a lot of foods high in solid fats, added sugars, or salt.  Get regular exercise. This is one of the most important things you can do for your health. ? Most adults should exercise for at least 150 minutes each week. The exercise should increase your  heart rate and make you sweat (moderate-intensity exercise). ? Most adults should also do strengthening exercises at least twice a week. This is in addition to the moderate-intensity exercise.  Maintain a healthy weight  Body mass index (BMI) is a measurement that can be used to identify possible weight problems. It estimates body fat based on height and weight. Your health care provider can help determine your BMI and help you achieve or maintain a healthy weight.  For females 56 years of age and older: ? A BMI below 18.5 is considered underweight. ? A BMI of 18.5 to 24.9 is normal. ? A BMI of 25 to 29.9 is considered overweight. ? A BMI of 30 and above is considered obese.  Watch levels of cholesterol and blood  lipids  You should start having your blood tested for lipids and cholesterol at 36 years of age, then have this test every 5 years.  You may need to have your cholesterol levels checked more often if: ? Your lipid or cholesterol levels are high. ? You are older than 36 years of age. ? You are at high risk for heart disease.  Cancer screening Lung Cancer  Lung cancer screening is recommended for adults 20-16 years old who are at high risk for lung cancer because of a history of smoking.  A yearly low-dose CT scan of the lungs is recommended for people who: ? Currently smoke. ? Have quit within the past 15 years. ? Have at least a 30-pack-year history of smoking. A pack year is smoking an average of one pack of cigarettes a day for 1 year.  Yearly screening should continue until it has been 15 years since you quit.  Yearly screening should stop if you develop a health problem that would prevent you from having lung cancer treatment.  Breast Cancer  Practice breast self-awareness. This means understanding how your breasts normally appear and feel.  It also means doing regular breast self-exams. Let your health care provider know about any changes, no matter how small.  If you are in your 20s or 30s, you should have a clinical breast exam (CBE) by a health care provider every 1-3 years as part of a regular health exam.  If you are 80 or older, have a CBE every year. Also consider having a breast X-ray (mammogram) every year.  If you have a family history of breast cancer, talk to your health care provider about genetic screening.  If you are at high risk for breast cancer, talk to your health care provider about having an MRI and a mammogram every year.  Breast cancer gene (BRCA) assessment is recommended for women who have family members with BRCA-related cancers. BRCA-related cancers include: ? Breast. ? Ovarian. ? Tubal. ? Peritoneal cancers.  Results of the assessment will  determine the need for genetic counseling and BRCA1 and BRCA2 testing.  Cervical Cancer Your health care provider may recommend that you be screened regularly for cancer of the pelvic organs (ovaries, uterus, and vagina). This screening involves a pelvic examination, including checking for microscopic changes to the surface of your cervix (Pap test). You may be encouraged to have this screening done every 3 years, beginning at age 70.  For women ages 41-65, health care providers may recommend pelvic exams and Pap testing every 3 years, or they may recommend the Pap and pelvic exam, combined with testing for human papilloma virus (HPV), every 5 years. Some types of HPV increase your risk of cervical cancer.  Testing for HPV may also be done on women of any age with unclear Pap test results.  Other health care providers may not recommend any screening for nonpregnant women who are considered low risk for pelvic cancer and who do not have symptoms. Ask your health care provider if a screening pelvic exam is right for you.  If you have had past treatment for cervical cancer or a condition that could lead to cancer, you need Pap tests and screening for cancer for at least 20 years after your treatment. If Pap tests have been discontinued, your risk factors (such as having a new sexual partner) need to be reassessed to determine if screening should resume. Some women have medical problems that increase the chance of getting cervical cancer. In these cases, your health care provider may recommend more frequent screening and Pap tests.  Colorectal Cancer  This type of cancer can be detected and often prevented.  Routine colorectal cancer screening usually begins at 36 years of age and continues through 36 years of age.  Your health care provider may recommend screening at an earlier age if you have risk factors for colon cancer.  Your health care provider may also recommend using home test kits to check  for hidden blood in the stool.  A small camera at the end of a tube can be used to examine your colon directly (sigmoidoscopy or colonoscopy). This is done to check for the earliest forms of colorectal cancer.  Routine screening usually begins at age 10.  Direct examination of the colon should be repeated every 5-10 years through 36 years of age. However, you may need to be screened more often if early forms of precancerous polyps or small growths are found.  Skin Cancer  Check your skin from head to toe regularly.  Tell your health care provider about any new moles or changes in moles, especially if there is a change in a mole's shape or color.  Also tell your health care provider if you have a mole that is larger than the size of a pencil eraser.  Always use sunscreen. Apply sunscreen liberally and repeatedly throughout the day.  Protect yourself by wearing long sleeves, pants, a wide-brimmed hat, and sunglasses whenever you are outside.  Heart disease, diabetes, and high blood pressure  High blood pressure causes heart disease and increases the risk of stroke. High blood pressure is more likely to develop in: ? People who have blood pressure in the high end of the normal range (130-139/85-89 mm Hg). ? People who are overweight or obese. ? People who are African American.  If you are 69-7 years of age, have your blood pressure checked every 3-5 years. If you are 67 years of age or older, have your blood pressure checked every year. You should have your blood pressure measured twice-once when you are at a hospital or clinic, and once when you are not at a hospital or clinic. Record the average of the two measurements. To check your blood pressure when you are not at a hospital or clinic, you can use: ? An automated blood pressure machine at a pharmacy. ? A home blood pressure monitor.  If you are between 71 years and 49 years old, ask your health care provider if you should take  aspirin to prevent strokes.  Have regular diabetes screenings. This involves taking a blood sample to check your fasting blood sugar level. ? If you are at a normal weight and have a low  risk for diabetes, have this test once every three years after 36 years of age. ? If you are overweight and have a high risk for diabetes, consider being tested at a younger age or more often. Preventing infection Hepatitis B  If you have a higher risk for hepatitis B, you should be screened for this virus. You are considered at high risk for hepatitis B if: ? You were born in a country where hepatitis B is common. Ask your health care provider which countries are considered high risk. ? Your parents were born in a high-risk country, and you have not been immunized against hepatitis B (hepatitis B vaccine). ? You have HIV or AIDS. ? You use needles to inject street drugs. ? You live with someone who has hepatitis B. ? You have had sex with someone who has hepatitis B. ? You get hemodialysis treatment. ? You take certain medicines for conditions, including cancer, organ transplantation, and autoimmune conditions.  Hepatitis C  Blood testing is recommended for: ? Everyone born from [birth year] through 1965. ? Anyone with known risk factors for hepatitis C.  Sexually transmitted infections (STIs)  You should be screened for sexually transmitted infections (STIs) including gonorrhea and chlamydia if: ? You are sexually active and are younger than 36 years of age. ? You are older than 36 years of age and your health care provider tells you that you are at risk for this type of infection. ? Your sexual activity has changed since you were last screened and you are at an increased risk for chlamydia or gonorrhea. Ask your health care provider if you are at risk.  If you do not have HIV, but are at risk, it may be recommended that you take a prescription medicine daily to prevent HIV infection. This is called  pre-exposure prophylaxis (PrEP). You are considered at risk if: ? You are sexually active and do not regularly use condoms or know the HIV status of your partner(s). ? You take drugs by injection. ? You are sexually active with a partner who has HIV.  Talk with your health care provider about whether you are at high risk of being infected with HIV. If you choose to begin PrEP, you should first be tested for HIV. You should then be tested every 3 months for as long as you are taking PrEP. Pregnancy  If you are premenopausal and you may become pregnant, ask your health care provider about preconception counseling.  If you may become pregnant, take 400 to 800 micrograms (mcg) of folic acid every day.  If you want to prevent pregnancy, talk to your health care provider about birth control (contraception). Osteoporosis and menopause  Osteoporosis is a disease in which the bones lose minerals and strength with aging. This can result in serious bone fractures. Your risk for osteoporosis can be identified using a bone density scan.  If you are 15 years of age or older, or if you are at risk for osteoporosis and fractures, ask your health care provider if you should be screened.  Ask your health care provider whether you should take a calcium or vitamin D supplement to lower your risk for osteoporosis.  Menopause may have certain physical symptoms and risks.  Hormone replacement therapy may reduce some of these symptoms and risks. Talk to your health care provider about whether hormone replacement therapy is right for you. Follow these instructions at home:  Schedule regular health, dental, and eye exams.  Stay current with your  immunizations.  Do not use any tobacco products including cigarettes, chewing tobacco, or electronic cigarettes.  If you are pregnant, do not drink alcohol.  If you are breastfeeding, limit how much and how often you drink alcohol.  Limit alcohol intake to no more  than 1 drink per day for nonpregnant women. One drink equals 12 ounces of beer, 5 ounces of wine, or 1 ounces of hard liquor.  Do not use street drugs.  Do not share needles.  Ask your health care provider for help if you need support or information about quitting drugs.  Tell your health care provider if you often feel depressed.  Tell your health care provider if you have ever been abused or do not feel safe at home. This information is not intended to replace advice given to you by your health care provider. Make sure you discuss any questions you have with your health care provider. Document Released: 03/23/2011 Document Revised: 02/13/2016 Document Reviewed: 06/11/2015 Elsevier Interactive Patient Education  2018 [employer]. Bupropion sustained-release tablets (smoking cessation) What is this medicine? BUPROPION (byoo PROE pee on) is used to help people quit smoking. This medicine may be used for other purposes; ask your health care provider or pharmacist if you have questions. COMMON BRAND NAME(S): Buproban, Zyban What should I tell my health care provider before I take this medicine? They need to know if you have any of these conditions: -an eating disorder, such as anorexia or bulimia -bipolar disorder or psychosis -diabetes or high blood sugar, treated with medication -glaucoma -head injury or brain tumor -heart disease, previous heart attack, or irregular heart beat -high blood pressure -kidney or liver disease -seizures -suicidal thoughts or a previous suicide attempt -Tourette's syndrome -weight loss -an unusual or allergic reaction to bupropion, other medicines, foods, dyes, or preservatives -breast-feeding -pregnant or trying to become pregnant How should I use this medicine? Take this medicine by mouth with a glass of water. Follow the directions on the prescription label. You can take it with or without food. If it upsets your stomach, take it with food. Do  not cut, crush or chew this medicine. Take your medicine at regular intervals. If you take this medicine more than once a day, take your second dose at least 8 hours after you take your first dose. To limit difficulty in sleeping, avoid taking this medicine at bedtime. Do not take your medicine more often than directed. Do not stop taking this medicine suddenly except upon the advice of your doctor. Stopping this medicine too quickly may cause serious side effects. A special MedGuide will be given to you by the pharmacist with each prescription and refill. Be sure to read this information carefully each time. Talk to your pediatrician regarding the use of this medicine in children. Special care may be needed. Overdosage: If you think you have taken too much of this medicine contact a poison control center or emergency room at once. NOTE: This medicine is only for you. Do not share this medicine with others. What if I miss a dose? If you miss a dose, skip the missed dose and take your next tablet at the regular time. There should be at least 8 hours between doses. Do not take double or extra doses. What may interact with this medicine? Do not take this medicine with any of the following medications: -linezolid -MAOIs like Azilect, Carbex, Eldepryl, Marplan, Nardil, and Parnate -methylene blue (injected into a vein) -other medicines that contain bupropion like Wellbutrin This  medicine may also interact with the following medications: -alcohol -certain medicines for anxiety or sleep -certain medicines for blood pressure like metoprolol, propranolol -certain medicines for depression or psychotic disturbances -certain medicines for HIV or AIDS like efavirenz, lopinavir, nelfinavir, ritonavir -certain medicines for irregular heart beat like propafenone, flecainide -certain medicines for Parkinson's disease like amantadine, levodopa -certain medicines for seizures like carbamazepine, phenytoin,  phenobarbital -cimetidine -clopidogrel -cyclophosphamide -digoxin -furazolidone -isoniazid -nicotine -orphenadrine -procarbazine -steroid medicines like prednisone or cortisone -stimulant medicines for attention disorders, weight loss, or to stay awake -tamoxifen -theophylline -thiotepa -ticlopidine -tramadol -warfarin This list may not describe all possible interactions. Give your health care provider a list of all the medicines, herbs, non-prescription drugs, or dietary supplements you use. Also tell them if you smoke, drink alcohol, or use illegal drugs. Some items may interact with your medicine. What should I watch for while using this medicine? Visit your doctor or health care professional for regular checks on your progress. This medicine should be used together with a patient support program. It is important to participate in a behavioral program, counseling, or other support program that is recommended by your health care professional. Patients and their families should watch out for new or worsening thoughts of suicide or depression. Also watch out for sudden changes in feelings such as feeling anxious, agitated, panicky, irritable, hostile, aggressive, impulsive, severely restless, overly excited and hyperactive, or not being able to sleep. If this happens, especially at the beginning of treatment or after a change in dose, call your health care professional. Avoid alcoholic drinks while taking this medicine. Drinking excessive alcoholic beverages, using sleeping or anxiety medicines, or quickly stopping the use of these agents while taking this medicine may increase your risk for a seizure. Do not drive or use heavy machinery until you know how this medicine affects you. This medicine can impair your ability to perform these tasks. Do not take this medicine close to bedtime. It may prevent you from sleeping. Your mouth may get dry. Chewing sugarless gum or sucking hard candy, and  drinking plenty of water may help. Contact your doctor if the problem does not go away or is severe. Do not use nicotine patches or chewing gum without the advice of your doctor or health care professional while taking this medicine. You may need to have your blood pressure taken regularly if your doctor recommends that you use both nicotine and this medicine together. What side effects may I notice from receiving this medicine? Side effects that you should report to your doctor or health care professional as soon as possible: -allergic reactions like skin rash, itching or hives, swelling of the face, lips, or tongue -breathing problems -changes in vision -confusion -elevated mood, decreased need for sleep, racing thoughts, impulsive behavior -fast or irregular heartbeat -hallucinations, loss of contact with reality -increased blood pressure -redness, blistering, peeling or loosening of the skin, including inside the mouth -seizures -suicidal thoughts or other mood changes -unusually weak or tired -vomiting Side effects that usually do not require medical attention (report to your doctor or health care professional if they continue or are bothersome): -constipation -headache -loss of appetite -nausea -tremors -weight loss This list may not describe all possible side effects. Call your doctor for medical advice about side effects. You may report side effects to FDA at 1-800-FDA-1088. Where should I keep my medicine? Keep out of the reach of children. Store at room temperature between 20 and 25 degrees C (68 and 77 degrees F).  Protect from light. Keep container tightly closed. Throw away any unused medicine after the expiration date. NOTE: This sheet is a summary. It may not cover all possible information. If you have questions about this medicine, talk to your doctor, pharmacist, or health care provider.  2018 Elsevier/Gold Standard (2016-02-28 13:49:28)  Steps to Quit Smoking Smoking  tobacco can be harmful to your health and can affect almost every organ in your body. Smoking puts you, and those around you, at risk for developing many serious chronic diseases. Quitting smoking is difficult, but it is one of the best things that you can do for your health. It is never too late to quit. What are the benefits of quitting smoking? When you quit smoking, you lower your risk of developing serious diseases and conditions, such as:  Lung cancer or lung disease, such as COPD.  Heart disease.  Stroke.  Heart attack.  Infertility.  Osteoporosis and bone fractures.  Additionally, symptoms such as coughing, wheezing, and shortness of breath may get better when you quit. You may also find that you get sick less often because your body is stronger at fighting off colds and infections. If you are pregnant, quitting smoking can help to reduce your chances of having a baby of low birth weight. How do I get ready to quit? When you decide to quit smoking, create a plan to make sure that you are successful. Before you quit:  Pick a date to quit. Set a date within the next two weeks to give you time to prepare.  Write down the reasons why you are quitting. Keep this list in places where you will see it often, such as on your bathroom mirror or in your car or wallet.  Identify the people, places, things, and activities that make you want to smoke (triggers) and avoid them. Make sure to take these actions: ? Throw away all cigarettes at home, at work, and in your car. ? Throw away smoking accessories, such as Set designer. ? Clean your car and make sure to empty the ashtray. ? Clean your home, including curtains and carpets.  Tell your family, friends, and coworkers that you are quitting. Support from your loved ones can make quitting easier.  Talk with your health care provider about your options for quitting smoking.  Find out what treatment options are covered by your  health insurance.  What strategies can I use to quit smoking? Talk with your healthcare provider about different strategies to quit smoking. Some strategies include:  Quitting smoking altogether instead of gradually lessening how much you smoke over a period of time. Research shows that quitting "cold [place]" is more successful than gradually quitting.  Attending in-person counseling to help you build problem-solving skills. You are more likely to have success in quitting if you attend several counseling sessions. Even short sessions of 10 minutes can be effective.  Finding resources and support systems that can help you to quit smoking and remain smoke-free after you quit. These resources are most helpful when you use them often. They can include: ? Online chats with a Veterinary surgeon. ? Telephone quitlines. ? Automotive engineer. ? Support groups or group counseling. ? Text messaging programs. ? Mobile phone applications.  Taking medicines to help you quit smoking. (If you are pregnant or breastfeeding, talk with your health care provider first.) Some medicines contain nicotine and some do not. Both types of medicines help with cravings, but the medicines that include nicotine help to relieve  withdrawal symptoms. Your health care provider may recommend: ? Nicotine patches, gum, or lozenges. ? Nicotine inhalers or sprays. ? Non-nicotine medicine that is taken by mouth.  Talk with your health care provider about combining strategies, such as taking medicines while you are also receiving in-person counseling. Using these two strategies together makes you more likely to succeed in quitting than if you used either strategy on its own. If you are pregnant or breastfeeding, talk with your health care provider about finding counseling or other support strategies to quit smoking. Do not take medicine to help you quit smoking unless told to do so by your health care provider. What things can I do  to make it easier to quit? Quitting smoking might feel overwhelming at first, but there is a lot that you can do to make it easier. Take these important actions:  Reach out to your family and friends and ask that they support and encourage you during this time. Call telephone quitlines, reach out to support groups, or work with a counselor for support.  Ask people who smoke to avoid smoking around you.  Avoid places that trigger you to smoke, such as bars, parties, or smoke-break areas at work.  Spend time around people who do not smoke.  Lessen stress in your life, because stress can be a smoking trigger for some people. To lessen stress, try: ? Exercising regularly. ? Deep-breathing exercises. ? Yoga. ? Meditating. ? Performing a body scan. This involves closing your eyes, scanning your body from head to toe, and noticing which parts of your body are particularly tense. Purposefully relax the muscles in those areas.  Download or purchase mobile phone or tablet apps (applications) that can help you stick to your quit plan by providing reminders, tips, and encouragement. There are many free apps, such as QuitGuide from the [employer] Office manager for Disease Control and Prevention). You can find other support for quitting smoking (smoking cessation) through smokefree.gov and other websites.  How will I feel when I quit smoking? Within the first 24 hours of quitting smoking, you may start to feel some withdrawal symptoms. These symptoms are usually most noticeable 2-3 days after quitting, but they usually do not last beyond 2-3 weeks. Changes or symptoms that you might experience include:  Mood swings.  Restlessness, anxiety, or irritation.  Difficulty concentrating.  Dizziness.  Strong cravings for sugary foods in addition to nicotine.  Mild weight gain.  Constipation.  Nausea.  Coughing or a sore throat.  Changes in how your medicines work in your body.  A depressed  mood.  Difficulty sleeping (insomnia).  After the first 2-3 weeks of quitting, you may start to notice more positive results, such as:  Improved sense of smell and taste.  Decreased coughing and sore throat.  Slower heart rate.  Lower blood pressure.  Clearer skin.  The ability to breathe more easily.  Fewer sick days.  Quitting smoking is very challenging for most people. Do not get discouraged if you are not successful the first time. Some people need to make many attempts to quit before they achieve long-term success. Do your best to stick to your quit plan, and talk with your health care provider if you have any questions or concerns. This information is not intended to replace advice given to you by your health care provider. Make sure you discuss any questions you have with your health care provider. Document Released: 09/01/2001 Document Revised: 05/05/2016 Document Reviewed: 01/22/2015 Elsevier Interactive Patient Education  2017 [employer].

## 2017-03-17 LAB — CBC
Hematocrit: 41.4 % (ref 34.0–46.6)
Hemoglobin: 14.3 g/dL (ref 11.1–15.9)
MCH: 30.8 pg (ref 26.6–33.0)
MCHC: 34.5 g/dL (ref 31.5–35.7)
MCV: 89 fL (ref 79–97)
Platelets: 233 10*3/uL (ref 150–379)
RBC: 4.64 x10E6/uL (ref 3.77–5.28)
RDW: 13.1 % (ref 12.3–15.4)
WBC: 6.8 10*3/uL (ref 3.4–10.8)

## 2017-03-17 LAB — COMPREHENSIVE METABOLIC PANEL
ALT: 9 IU/L (ref 0–32)
AST: 14 IU/L (ref 0–40)
Albumin/Globulin Ratio: 1.8 (ref 1.2–2.2)
Albumin: 4.6 g/dL (ref 3.5–5.5)
Alkaline Phosphatase: 77 IU/L (ref 39–117)
BUN/Creatinine Ratio: 14 (ref 9–23)
BUN: 11 mg/dL (ref 6–20)
Bilirubin Total: 1 mg/dL (ref 0.0–1.2)
CO2: 23 mmol/L (ref 20–29)
Calcium: 9.7 mg/dL (ref 8.7–10.2)
Chloride: 104 mmol/L (ref 96–106)
Creatinine, Ser: 0.77 mg/dL (ref 0.57–1.00)
GFR calc Af Amer: 116 mL/min/{1.73_m2} (ref >59)
GFR calc non Af Amer: 100 mL/min/{1.73_m2} (ref >59)
Globulin, Total: 2.5 g/dL (ref 1.5–4.5)
Glucose: 94 mg/dL (ref 65–99)
Potassium: 4.5 mmol/L (ref 3.5–5.2)
Sodium: 137 mmol/L (ref 134–144)
Total Protein: 7.1 g/dL (ref 6.0–8.5)

## 2017-03-18 ENCOUNTER — Telehealth: Payer: Self-pay | Admitting: Obstetrics and Gynecology

## 2017-03-18 LAB — PAP IG AND HPV HIGH-RISK
HPV, high-risk: NEGATIVE
PAP Smear Comment: 0

## 2017-03-18 MED ORDER — FLUCONAZOLE 150 MG PO TABS: 150.0000 mg | ORAL_TABLET | ORAL | 0 refills | Status: DC

## 2017-03-18 NOTE — Telephone Encounter (Signed)
Patient called concerned because she can not pick up her prescription from the pharmacy, The pharmacy needs  "instructions/ something verified in order to give the patient her prescription.The prescription she is not able to get is the fluconazole (DIFLUCAN) 150 MG tablet  Patient would also like to have a call back to discuss and clarify a few things in regards to this issue. Please advise.

## 2017-03-18 NOTE — Telephone Encounter (Signed)
Contacted patient regarding prescription.  Prescription has been clarified and sent to the pharmacy.  Patient notes no other questions or concerns at this time.

## 2017-04-27 ENCOUNTER — Encounter: Payer: Medicaid Other | Admitting: Obstetrics and Gynecology

## 2017-04-30 ENCOUNTER — Encounter: Payer: Medicaid Other | Admitting: Obstetrics and Gynecology

## 2017-05-06 ENCOUNTER — Ambulatory Visit (INDEPENDENT_AMBULATORY_CARE_PROVIDER_SITE_OTHER): Payer: Medicaid Other | Admitting: Obstetrics and Gynecology

## 2017-05-06 ENCOUNTER — Encounter: Payer: Self-pay | Admitting: Obstetrics and Gynecology

## 2017-05-06 VITALS — BP 119/77 | HR 76 | Wt 186.4 lb

## 2017-05-06 DIAGNOSIS — B9689 Other specified bacterial agents as the cause of diseases classified elsewhere: Secondary | ICD-10-CM

## 2017-05-06 DIAGNOSIS — M546 Pain in thoracic spine: Secondary | ICD-10-CM

## 2017-05-06 DIAGNOSIS — G43009 Migraine without aura, not intractable, without status migrainosus: Secondary | ICD-10-CM | POA: Diagnosis not present

## 2017-05-06 DIAGNOSIS — G8929 Other chronic pain: Secondary | ICD-10-CM | POA: Diagnosis not present

## 2017-05-06 DIAGNOSIS — Z716 Tobacco abuse counseling: Secondary | ICD-10-CM

## 2017-05-06 DIAGNOSIS — N760 Acute vaginitis: Secondary | ICD-10-CM | POA: Diagnosis not present

## 2017-05-06 MED ORDER — TRAMADOL HCL 50 MG PO TABS: 50.0000 mg | ORAL_TABLET | Freq: Four times a day (QID) | ORAL | 0 refills | Status: DC | PRN

## 2017-05-06 MED ORDER — SUMATRIPTAN SUCCINATE 25 MG PO TABS: 25.0000 mg | ORAL_TABLET | ORAL | 3 refills | Status: DC | PRN

## 2017-05-06 NOTE — Progress Notes (Signed)
    GYNECOLOGY PROGRESS NOTE  Subjective:    Patient ID: Vanessa Franco, female    DOB: 1980/09/27, 36 y.o.   MRN: [id number]  HPI  Patient is a 36 y.o. [id number] female who presents for f/u of attempted smoking cessation.  Patient was started on Wellbutrin ~ 6 weeks ago.  States that she did not start taking Wellbutrin as prescribed due to fear of side effects. Plans to go cold [place] for smoking.    Patient also with the following complaints today:  1. Migraines - patient notes a h/o migraines prior to pregnancy, states that she just "dealt with them" as she did not have a regular doctor prior to her pregnancy. Notes that her migraines have been occurring more frequently, having at least 1 every 1-2 weeks.  2. Patient thinks she may have a vaginal infection.  Is currently on Diflucan prophylaxis for h/o recurrent yeast vaginitis.  Notes that she has been noticing a discharge with odor (smells like spoiled milk) over the past few days. Is not sexually active. 3. Back pain still noted from epidural site.  Reports that it still hurts when moving or bending in certain positions, and pain may last from 15 minutes to 1-2 hours. Has tried Ibuprofen and  Tylenol without relief.    The following portions of the patient's history were reviewed and updated as appropriate: allergies, current medications, past family history, past medical history, past social history, past surgical history and problem list.  Review of Systems Pertinent items noted in HPI and remainder of comprehensive ROS otherwise negative.   Objective:   Blood pressure 119/77, pulse 76, weight 186 lb 6 oz (84.5 kg), last menstrual period 04/25/2017, not currently breastfeeding. General appearance: alert and no distress  Back: symmetric, no curvature. ROM normal. No CVA tenderness., Mild tenderness to palpation in mid-thoracic region Pelvic: deferred.   Assessment:   Encounter for tobacco use cessation counseling  Chronic  midline thoracic back pain  Bacterial vaginosis  Migraine without aura and without status migrainosus, not intractable   Plan:    1.  Continued further counseling for smoking cessation. Patient plans on cold [place] method. Encouraged to set quit date. Patient for smoking cessation on 05/24/17  (Labor Day).  Will have patient f/u 1 month from quit date (can send update via Mychart if smoking cessation going well, otherwise should make an appointment for further follow up in office). Reiterated resources for smoking cessation (including hotlines, support groups, etc) 2. Back pain - not relieved with OTC meds.  Prescribed Ultram. Recommended that patient be seen by physical therapist or Chiropractor.   3. Suspected bacterial vaginitis as patient currently on Diflucan for recurrent yeast vaginitis prophylactic therapy.  Will give sample of 1-time dosing Clindesse for treatment.  4. Migraines - discussed management options for patient.  Will prescribe Imitrex.     [name], MD [facility]

## 2017-05-06 NOTE — Patient Instructions (Addendum)
Steps to Quit Smoking Smoking tobacco can be harmful to your health and can affect almost every organ in your body. Smoking puts you, and those around you, at risk for developing many serious chronic diseases. Quitting smoking is difficult, but it is one of the best things that you can do for your health. It is never too late to quit. What are the benefits of quitting smoking? When you quit smoking, you lower your risk of developing serious diseases and conditions, such as:  Lung cancer or lung disease, such as COPD.  Heart disease.  Stroke.  Heart attack.  Infertility.  Osteoporosis and bone fractures.  Additionally, symptoms such as coughing, wheezing, and shortness of breath may get better when you quit. You may also find that you get sick less often because your body is stronger at fighting off colds and infections. If you are pregnant, quitting smoking can help to reduce your chances of having a baby of low birth weight. How do I get ready to quit? When you decide to quit smoking, create a plan to make sure that you are successful. Before you quit:  Pick a date to quit. Set a date within the next two weeks to give you time to prepare.  Write down the reasons why you are quitting. Keep this list in places where you will see it often, such as on your bathroom mirror or in your car or wallet.  Identify the people, places, things, and activities that make you want to smoke (triggers) and avoid them. Make sure to take these actions: ? Throw away all cigarettes at home, at work, and in your car. ? Throw away smoking accessories, such as ashtrays and lighters. ? Clean your car and make sure to empty the ashtray. ? Clean your home, including curtains and carpets.  Tell your family, friends, and coworkers that you are quitting. Support from your loved ones can make quitting easier.  Talk with your health care provider about your options for quitting smoking.  Find out what treatment  options are covered by your health insurance.  What strategies can I use to quit smoking? Talk with your healthcare provider about different strategies to quit smoking. Some strategies include:  Quitting smoking altogether instead of gradually lessening how much you smoke over a period of time. Research shows that quitting "cold turkey" is more successful than gradually quitting.  Attending in-person counseling to help you build problem-solving skills. You are more likely to have success in quitting if you attend several counseling sessions. Even short sessions of 10 minutes can be effective.  Finding resources and support systems that can help you to quit smoking and remain smoke-free after you quit. These resources are most helpful when you use them often. They can include: ? Online chats with a counselor. ? Telephone quitlines. ? Printed self-help materials. ? Support groups or group counseling. ? Text messaging programs. ? Mobile phone applications.  Taking medicines to help you quit smoking. (If you are pregnant or breastfeeding, talk with your health care provider first.) Some medicines contain nicotine and some do not. Both types of medicines help with cravings, but the medicines that include nicotine help to relieve withdrawal symptoms. Your health care provider may recommend: ? Nicotine patches, gum, or lozenges. ? Nicotine inhalers or sprays. ? Non-nicotine medicine that is taken by mouth.  Talk with your health care provider about combining strategies, such as taking medicines while you are also receiving in-person counseling. Using these two strategies together   makes you more likely to succeed in quitting than if you used either strategy on its own. If you are pregnant or breastfeeding, talk with your health care provider about finding counseling or other support strategies to quit smoking. Do not take medicine to help you quit smoking unless told to do so by your health care  provider. What things can I do to make it easier to quit? Quitting smoking might feel overwhelming at first, but there is a lot that you can do to make it easier. Take these important actions:  Reach out to your family and friends and ask that they support and encourage you during this time. Call telephone quitlines, reach out to support groups, or work with a counselor for support.  Ask people who smoke to avoid smoking around you.  Avoid places that trigger you to smoke, such as bars, parties, or smoke-break areas at work.  Spend time around people who do not smoke.  Lessen stress in your life, because stress can be a smoking trigger for some people. To lessen stress, try: ? Exercising regularly. ? Deep-breathing exercises. ? Yoga. ? Meditating. ? Performing a body scan. This involves closing your eyes, scanning your body from head to toe, and noticing which parts of your body are particularly tense. Purposefully relax the muscles in those areas.  Download or purchase mobile phone or tablet apps (applications) that can help you stick to your quit plan by providing reminders, tips, and encouragement. There are many free apps, such as QuitGuide from the [employer] manager for Disease Control and Prevention). You can find other support for quitting smoking (smoking cessation) through smokefree.gov and other websites.  How will I feel when I quit smoking? Within the first 24 hours of quitting smoking, you may start to feel some withdrawal symptoms. These symptoms are usually most noticeable 2-3 days after quitting, but they usually do not last beyond 2-3 weeks. Changes or symptoms that you might experience include:  Mood swings.  Restlessness, anxiety, or irritation.  Difficulty concentrating.  Dizziness.  Strong cravings for sugary foods in addition to nicotine.  Mild weight gain.  Constipation.  Nausea.  Coughing or a sore throat.  Changes in how your medicines work in your  body.  A depressed mood.  Difficulty sleeping (insomnia).  After the first 2-3 weeks of quitting, you may start to notice more positive results, such as:  Improved sense of smell and taste.  Decreased coughing and sore throat.  Slower heart rate.  Lower blood pressure.  Clearer skin.  The ability to breathe more easily.  Fewer sick days.  Quitting smoking is very challenging for most people. Do not get discouraged if you are not successful the first time. Some people need to make many attempts to quit before they achieve long-term success. Do your best to stick to your quit plan, and talk with your health care provider if you have any questions or concerns. This information is not intended to replace advice given to you by your health care provider. Make sure you discuss any questions you have with your health care provider. Document Released: 09/01/2001 Document Revised: 05/05/2016 Document Reviewed: 01/22/2015 Elsevier Interactive Patient Education  2017 [employer] with Quitting Smoking Quitting smoking is a physical and mental challenge. You will face cravings, withdrawal symptoms, and temptation. Before quitting, work with your health care provider to make a plan that can help you cope. Preparation can help you quit and keep you from giving in. How  can I cope with cravings? Cravings usually last for 5-10 minutes. If you get through it, the craving will pass. Consider taking the following actions to help you cope with cravings:  Keep your mouth busy: ? Chew sugar-free gum. ? Suck on hard candies or a straw. ? Brush your teeth.  Keep your hands and body busy: ? Immediately change to a different activity when you feel a craving. ? Squeeze or play with a ball. ? Do an activity or a hobby, like making bead jewelry, practicing needlepoint, or working with wood. ? Mix up your normal routine. ? Take a short exercise break. Go for a quick walk or run up and down  stairs. ? Spend time in public places where smoking is not allowed.  Focus on doing something kind or helpful for someone else.  Call a friend or family member to talk during a craving.  Join a support group.  Call a quit line, such as 1-800-QUIT-NOW.  Talk with your health care provider about medicines that might help you cope with cravings and make quitting easier for you.  How can I deal with withdrawal symptoms? Your body may experience negative effects as it tries to get used to not having nicotine in the system. These effects are called withdrawal symptoms. They may include:  Feeling hungrier than normal.  Trouble concentrating.  Irritability.  Trouble sleeping.  Feeling depressed.  Restlessness and agitation.  Craving a cigarette.  To manage withdrawal symptoms:  Avoid places, people, and activities that trigger your cravings.  Remember why you want to quit.  Get plenty of sleep.  Avoid coffee and other caffeinated drinks. These may worsen some of your symptoms.  How can I handle social situations? Social situations can be difficult when you are quitting smoking, especially in the first few weeks. To manage this, you can:  Avoid parties, bars, and other social situations where people might be smoking.  Avoid alcohol.  Leave right away if you have the urge to smoke.  Explain to your family and friends that you are quitting smoking. Ask for understanding and support.  Plan activities with friends or family where smoking is not an option.  What are some ways I can cope with stress? Wanting to smoke may cause stress, and stress can make you want to smoke. Find ways to manage your stress. Relaxation techniques can help. For example:  Breathe slowly and deeply, in through your nose and out through your mouth.  Listen to soothing, relaxing music.  Talk with a family member or friend about your stress.  Light a candle.  Soak in a bath or take a  shower.  Think about a peaceful place.  What are some ways I can prevent weight gain? Be aware that many people gain weight after they quit smoking. However, not everyone does. To keep from gaining weight, have a plan in place before you quit and stick to the plan after you quit. Your plan should include:  Having healthy snacks. When you have a craving, it may help to: ? Eat plain popcorn, crunchy carrots, celery, or other cut vegetables. ? Chew sugar-free gum.  Changing how you eat: ? Eat small portion sizes at meals. ? Eat 4-6 small meals throughout the day instead of 1-2 large meals a day. ? Be mindful when you eat. Do not watch television or do other things that might distract you as you eat.  Exercising regularly: ? Make time to exercise each day. If you do   not have time for a long workout, do short bouts of exercise for 5-10 minutes several times a day. ? Do some form of strengthening exercise, like weight lifting, and some form of aerobic exercise, like running or swimming.  Drinking plenty of water or other low-calorie or no-calorie drinks. Drink 6-8 glasses of water daily, or as much as instructed by your health care provider.  Summary  Quitting smoking is a physical and mental challenge. You will face cravings, withdrawal symptoms, and temptation to smoke again. Preparation can help you as you go through these challenges.  You can cope with cravings by keeping your mouth busy (such as by chewing gum), keeping your body and hands busy, and making calls to family, friends, or a helpline for people who want to quit smoking.  You can cope with withdrawal symptoms by avoiding places where people smoke, avoiding drinks with caffeine, and getting plenty of rest.  Ask your health care provider about the different ways to prevent weight gain, avoid stress, and handle social situations. This information is not intended to replace advice given to you by your health care provider. Make  sure you discuss any questions you have with your health care provider. Document Released: 09/04/2016 Document Revised: 09/04/2016 Document Reviewed: 09/04/2016 Elsevier Interactive Patient Education  2018 [employer]. Sumatriptan tablets What is this medicine? SUMATRIPTAN (soo ma TRIP tan) is used to treat migraines with or without aura. An aura is a strange feeling or visual disturbance that warns you of an attack. It is not used to prevent migraines. This medicine may be used for other purposes; ask your health care provider or pharmacist if you have questions. COMMON BRAND NAME(S): Imitrex, Migraine Pack What should I tell my health care provider before I take this medicine? They need to know if you have any of these conditions: -circulation problems in fingers and toes -diabetes -heart disease -high blood pressure -high cholesterol -history of irregular heartbeat -history of stroke -kidney disease -liver disease -postmenopausal or surgical removal of uterus and ovaries -seizures -smoke tobacco -stomach or intestine problems -an unusual or allergic reaction to sumatriptan, other medicines, foods, dyes, or preservatives -pregnant or trying to get pregnant -breast-feeding How should I use this medicine? Take this medicine by mouth with a glass of water. Follow the directions on the prescription label. This medicine is taken at the first symptoms of a migraine. It is not for everyday use. If your migraine headache returns after one dose, you can take another dose as directed. You must leave at least 2 hours between doses, and do not take more than 100 mg as a single dose. Do not take more than 200 mg total in any 24 hour period. If there is no improvement at all after the first dose, do not take a second dose without talking to your doctor or health care professional. Do not take your medicine more often than directed. Talk to your pediatrician regarding the use of this medicine in  children. Special care may be needed. Overdosage: If you think you have taken too much of this medicine contact a poison control center or emergency room at once. NOTE: This medicine is only for you. Do not share this medicine with others. What if I miss a dose? This does not apply; this medicine is not for regular use. What may interact with this medicine? Do not take this medicine with any of the following medicines: -cocaine -ergot alkaloids like dihydroergotamine, ergonovine, ergotamine, methylergonovine -feverfew -MAOIs like Carbex,  Eldepryl, Marplan, Nardil, and Parnate -other medicines for migraine headache like almotriptan, eletriptan, frovatriptan, naratriptan, rizatriptan, zolmitriptan -tryptophan This medicine may also interact with the following medications: -certain medicines for depression, anxiety, or psychotic disturbances This list may not describe all possible interactions. Give your health care provider a list of all the medicines, herbs, non-prescription drugs, or dietary supplements you use. Also tell them if you smoke, drink alcohol, or use illegal drugs. Some items may interact with your medicine. What should I watch for while using this medicine? Only take this medicine for a migraine headache. Take it if you get warning symptoms or at the start of a migraine attack. It is not for regular use to prevent migraine attacks. You may get drowsy or dizzy. Do not drive, use machinery, or do anything that needs mental alertness until you know how this medicine affects you. To reduce dizzy or fainting spells, do not sit or stand up quickly, especially if you are an older patient. Alcohol can increase drowsiness, dizziness and flushing. Avoid alcoholic drinks. Smoking cigarettes may increase the risk of heart-related side effects from using this medicine. If you take migraine medicines for 10 or more days a month, your migraines may get worse. Keep a diary of headache days and  medicine use. Contact your healthcare professional if your migraine attacks occur more frequently. What side effects may I notice from receiving this medicine? Side effects that you should report to your doctor or health care professional as soon as possible: -allergic reactions like skin rash, itching or hives, swelling of the face, lips, or tongue -bloody or watery diarrhea -hallucination, loss of contact with reality -pain, tingling, numbness in the face, hands, or feet -seizures -signs and symptoms of a blood clot such as breathing problems; changes in vision; chest pain; severe, sudden headache; pain, swelling, warmth in the leg; trouble speaking; sudden numbness or weakness of the face, arm, or leg -signs and symptoms of a dangerous change in heartbeat or heart rhythm like chest pain; dizziness; fast or irregular heartbeat; palpitations, feeling faint or lightheaded; falls; breathing problems -signs and symptoms of a stroke like changes in vision; confusion; trouble speaking or understanding; severe headaches; sudden numbness or weakness of the face, arm, or leg; trouble walking; dizziness; loss of balance or coordination -stomach pain Side effects that usually do not require medical attention (report to your doctor or health care professional if they continue or are bothersome): -changes in taste -facial flushing -headache -muscle cramps -muscle pain -nausea, vomiting -weak or tired This list may not describe all possible side effects. Call your doctor for medical advice about side effects. You may report side effects to FDA at 1-800-FDA-1088. Where should I keep my medicine? Keep out of the reach of children. Store at room temperature between 2 and 30 degrees C (36 and 86 degrees F). Throw away any unused medicine after the expiration date. NOTE: This sheet is a summary. It may not cover all possible information. If you have questions about this medicine, talk to your doctor,  pharmacist, or health care provider.  2018 Elsevier/Gold Standard (2015-10-10 12:38:23)

## 2017-05-06 NOTE — Progress Notes (Signed)
Pt states she doesn't know why she is here- Dr Marcelline Mates set this appt up.Also states she has been having headaches since she started OCPs.

## 2017-07-07 ENCOUNTER — Encounter: Payer: Self-pay | Admitting: Obstetrics and Gynecology

## 2017-09-20 ENCOUNTER — Encounter: Payer: Self-pay | Admitting: Emergency Medicine

## 2017-09-20 ENCOUNTER — Emergency Department
Admission: EM | Admit: 2017-09-20 | Discharge: 2017-09-20 | Disposition: A | Discharge status: Home / Self Care | Payer: Medicaid Other | Attending: Student in an Organized Health Care Education/Training Program | Admitting: Student in an Organized Health Care Education/Training Program

## 2017-09-20 ENCOUNTER — Other Ambulatory Visit: Payer: Self-pay

## 2017-09-20 DIAGNOSIS — J029 Acute pharyngitis, unspecified: Secondary | ICD-10-CM | POA: Diagnosis not present

## 2017-09-20 DIAGNOSIS — Z79899 Other long term (current) drug therapy: Secondary | ICD-10-CM | POA: Diagnosis not present

## 2017-09-20 DIAGNOSIS — B349 Viral infection, unspecified: Secondary | ICD-10-CM | POA: Diagnosis not present

## 2017-09-20 DIAGNOSIS — R51 Headache: Secondary | ICD-10-CM | POA: Diagnosis present

## 2017-09-20 DIAGNOSIS — F17200 Nicotine dependence, unspecified, uncomplicated: Secondary | ICD-10-CM | POA: Diagnosis not present

## 2017-09-20 LAB — INFLUENZA PANEL BY PCR (TYPE A & B)
Influenza A By PCR: NEGATIVE
Influenza B By PCR: NEGATIVE

## 2017-09-20 LAB — GROUP A STREP BY PCR: Group A Strep by PCR: NOT DETECTED

## 2017-09-20 MED ORDER — LIDOCAINE VISCOUS 2 % MT SOLN: 5.0000 mL | Freq: Four times a day (QID) | OROMUCOSAL | 0 refills | Status: DC | PRN

## 2017-09-20 MED ORDER — PSEUDOEPH-BROMPHEN-DM 30-2-10 MG/5ML PO SYRP: 5.0000 mL | ORAL_SOLUTION | Freq: Four times a day (QID) | ORAL | 0 refills | Status: DC | PRN

## 2017-09-20 MED ORDER — IBUPROFEN 800 MG PO TABS: 800.0000 mg | ORAL_TABLET | Freq: Three times a day (TID) | ORAL | 0 refills | Status: DC | PRN

## 2017-09-20 NOTE — ED Notes (Signed)
See triage note states she developed headache on Friday  Unsure of fever  Positive body aches

## 2017-09-20 NOTE — ED Provider Notes (Signed)
[facility] Emergency Department Provider Note   ____________________________________________   First MD Initiated Contact with Patient 09/20/17 1132     (approximate)  I have reviewed the triage vital signs and the nursing notes.   HISTORY  Chief Complaint flu like symptoms    HPI Vanessa Franco is a 36 y.o. female patient complain headache,nasal congestion, sore throat, and body aches for 2 days. Patient denies nausea, vomiting, diarrhea.  Patient rates the pain as a 10 over 10. Patient describes pain as "achy". No palliative measures for complaint.   Past Medical History:  Diagnosis Date  . Anxiety   . Depression   . Fibroid   . History of D&C   . History of uterine fibroid 11/2013   [facility]    Patient Active Problem List   Diagnosis Date Noted  . Tobacco abuse 02/04/2016    Past Surgical History:  Procedure Laterality Date  . DILATION AND CURETTAGE OF UTERUS    . MYOMECTOMY  2015   Hysteroscopic    Prior to Admission medications   Medication Sig Start Date End Date Taking? Authorizing Provider  brompheniramine-pseudoephedrine-DM 30-2-10 MG/5ML syrup Take 5 mLs by mouth 4 (four) times daily as needed. Mixed with viscous lidocaine swish and swallow 09/20/17   Sable Feil, PA-C  fluconazole (DIFLUCAN) 150 MG tablet Take 1 tablet (150 mg total) by mouth once a week. 03/18/17   [name], MD  ibuprofen (ADVIL,MOTRIN) 800 MG tablet Take 1 tablet (800 mg total) by mouth every 8 (eight) hours as needed for moderate pain. 09/20/17   Sable Feil, PA-C  lidocaine (XYLOCAINE) 2 % solution Use as directed 5 mLs in the mouth or throat every 6 (six) hours as needed for mouth pain. Mixed with 5 mL of Bromfed-DM for swish and swallow 09/20/17   Sable Feil, PA-C  norethindrone (MICRONOR,CAMILA,ERRIN) 0.35 MG tablet Take 1 tablet (0.35 mg total) by mouth daily. 03/16/17   [name], MD  Omega-3 Fatty Acids (FISH OIL) 1000 MG CAPS Take by  mouth.    [provider]  Prenatal Vit-Fe Fumarate-FA (PRENATAL MULTIVITAMIN) TABS tablet Take 1 tablet by mouth daily at 12 noon.    [provider]  SUMAtriptan (IMITREX) 25 MG tablet Take 1 tablet (25 mg total) by mouth every 2 (two) hours as needed for migraine. May repeat in 2 hours if headache persists or recurs. Do not exceed 200 mg. 05/06/17   [name], MD  traMADol (ULTRAM) 50 MG tablet Take 1 tablet (50 mg total) by mouth every 6 (six) hours as needed. 05/06/17   [name], MD    Allergies Ketorolac; Toradol [ketorolac tromethamine]; and Sulfa antibiotics  Family History  Problem Relation Age of Onset  . Diabetes Father   . Kidney failure Father   . Heart failure Father   . Rheum arthritis Mother   . Hypertension Mother     Social History Social History   Tobacco Use  . Smoking status: Current Some Day Smoker    Packs/day: 0.25  . Smokeless tobacco: Never Used  Substance Use Topics  . Alcohol use: No    Alcohol/week: 0.0 oz    Comment: rare  . Drug use: Yes    Types: Marijuana    Review of Systems  Constitutional: No fever/chills Eyes: No visual changes. ENT: No sore throat. Cardiovascular: Denies chest pain. Respiratory: Denies shortness of breath. Gastrointestinal: No abdominal pain.  No nausea, no vomiting.  No diarrhea.  No constipation. Genitourinary: Negative for dysuria. Musculoskeletal: Negative for back pain. Skin: Negative for rash. Neurological: Negative for headaches, focal weakness or numbness. Psychiatric:Anxiety and depression Allergic/Immunilogical: Toradol and sulfa antibiotics ____________________________________________   PHYSICAL EXAM:  VITAL SIGNS: ED Triage Vitals  Enc Vitals Group     BP 09/20/17 1056 130/82     Pulse Rate 09/20/17 1056 89     Resp 09/20/17 1056 18     Temp 09/20/17 1056 99.3 F (37.4 C)     Temp Source 09/20/17 1056 Oral     SpO2 09/20/17 1056 98 %     Weight 09/20/17 1054 190 lb  (86.2 kg)     Height 09/20/17 1054 5\' 7"  (1.702 m)     Head Circumference --      Peak Flow --      Pain Score 09/20/17 1054 10     Pain Loc --      Pain Edu? --      Excl. in [place]? --     Constitutional: Alert and oriented. Well appearing and in no acute distress. Appears malaise   Nose: Edematous nasal turbinates with clear rhinorrhea . Mouth/Throat: Mucous membranes are moist.  Oropharynx erythematous. Neck: No stridor.   Hematological/Lymphatic/Immunilogical:Cardiovascular: Normal rate, regular rhythm. Grossly normal heart sounds.  Good peripheral circulation. Respiratory: Normal respiratory effort.  No retractions. Lungs CTAB. Neurologic:  Normal speech and language. No gross focal neurologic deficits are appreciated. No gait instability. Skin:  Skin is warm, dry and intact. No rash noted. Psychiatric: Mood and affect are normal. Speech and behavior are normal.  ____________________________________________   LABS (all labs ordered are listed, but only abnormal results are displayed)  Labs Reviewed  GROUP A STREP BY PCR  INFLUENZA PANEL BY PCR (TYPE A & B)   ____________________________________________  EKG   ____________________________________________  RADIOLOGY  No results found.  ____________________________________________   PROCEDURES  Procedure(s) performed: None  Procedures  Critical Care performed: No  ____________________________________________   INITIAL IMPRESSION / ASSESSMENT AND PLAN / ED COURSE  As part of my medical decision making, I reviewed the following data within the electronic MEDICAL RECORD NUMBER    Viral upper respiratory infection with sore throat. Discussed negative rapid strep test. Patient given discharge Instructions and advised take medication as directed. Patient given a work note and advised follow-up with PCP if condition persists.    ____________________________________________   FINAL CLINICAL IMPRESSION(S) / ED  DIAGNOSES  Final diagnoses:  Viral illness  Sore throat     ED Discharge Orders        Ordered    brompheniramine-pseudoephedrine-DM 30-2-10 MG/5ML syrup  4 times daily PRN     09/20/17 1235    lidocaine (XYLOCAINE) 2 % solution  Every 6 hours PRN     09/20/17 1235    ibuprofen (ADVIL,MOTRIN) 800 MG tablet  Every 8 hours PRN     09/20/17 1235       Note:  This document was prepared using Dragon voice recognition software and may include unintentional dictation errors.    Sable Feil, PA-C 09/20/17 1238    Merlyn Lot, MD 09/20/17 1257

## 2017-09-20 NOTE — ED Triage Notes (Signed)
Started with headache Friday which has progressed into congestion, sore throat, and body aches.  Ambulatory to triage without difficulty. NAD

## 2017-10-06 ENCOUNTER — Encounter: Payer: Self-pay | Admitting: Obstetrics and Gynecology

## 2017-10-13 ENCOUNTER — Ambulatory Visit (INDEPENDENT_AMBULATORY_CARE_PROVIDER_SITE_OTHER): Payer: Medicaid Other | Admitting: Obstetrics and Gynecology

## 2017-10-13 ENCOUNTER — Encounter: Payer: Self-pay | Admitting: Obstetrics and Gynecology

## 2017-10-13 VITALS — BP 132/79 | HR 85 | Ht 67.0 in | Wt 189.8 lb

## 2017-10-13 DIAGNOSIS — R238 Other skin changes: Secondary | ICD-10-CM | POA: Diagnosis not present

## 2017-10-13 DIAGNOSIS — B3731 Acute candidiasis of vulva and vagina: Secondary | ICD-10-CM

## 2017-10-13 DIAGNOSIS — Z30017 Encounter for initial prescription of implantable subdermal contraceptive: Secondary | ICD-10-CM | POA: Diagnosis not present

## 2017-10-13 DIAGNOSIS — B373 Candidiasis of vulva and vagina: Secondary | ICD-10-CM

## 2017-10-13 DIAGNOSIS — R233 Spontaneous ecchymoses: Secondary | ICD-10-CM

## 2017-10-13 DIAGNOSIS — Z720 Tobacco use: Secondary | ICD-10-CM

## 2017-10-13 MED ORDER — FLUCONAZOLE 150 MG PO TABS: 150.0000 mg | ORAL_TABLET | ORAL | 0 refills | Status: DC

## 2017-10-13 NOTE — Progress Notes (Signed)
GYNECOLOGY PROGRESS NOTE  Subjective:    Patient ID: Vanessa Franco, female    DOB: 1981-06-30, 37 y.o.   MRN: [id number]  HPI  Patient is a 37 y.o. G59P1021 female who presents for discussion of changing her contraceptive method. She is currently on progesterone OCPs, however she notes that she has been experiencing easy bruising since shortly after initiating the birth control.  Desires to switch to something different.  Denies h/o easy bruising prior to initiation of the pills, and denies any personal or family h/o bleeding disorders.   She also complains of possible yeast infection. Has been noting a slightly thicker (clumpy discharge) over the past few days. Notes that she was recently treated with antibiotics for strep throat.  The following portions of the patient's history were reviewed and updated as appropriate: allergies, current medications, past family history, past medical history, past social history, past surgical history and problem list.  Review of Systems Pertinent items noted in HPI and remainder of comprehensive ROS otherwise negative.   Objective:   Blood pressure 132/79, pulse 85, height 5\' 7"  (1.702 m), weight 189 lb 12.8 oz (86.1 kg), last menstrual period 09/21/2017, not currently breastfeeding. General appearance: alert and no distress  Abdomen: soft, non-tender; bowel sounds normal; no masses,  no organomegaly Pelvic: external genitalia normal, rectovaginal septum normal.  Vagina with small amount of clumpy white discharge.  Cervix normal appearing, no lesions and no motion tenderness.  Uterus mobile, nontender, normal shape and size.  Adnexae non-palpable, nontender bilaterally.  Skin: no sores or rashes or color changes.  Old bruising of lower extremities on upper thighs, approximately 2 x 2 cm    Assessment:   Easy bruising Contraception management Yeast vaginitis Tobacco abuse   Plan:   1. Several old bruises noted on lower extremities. Onset of  symptoms coincides with initiation of OCPs.  Although an unlikely occurrence, is not impossible, so will change contraception. If symptoms continue, may require further workup.  2. Contraception management. Discussed options, patient leaning towards Nexplanon.  Cannot use estrogen products due to age > 37 and smoker. Nexplanon inserted today.  3. Yeast vaginitis after use of antibiotics. Will prescribe Diflucan.  4. Tobacco abuse, counseled again on cessation. She states that she is "working on it", trying to cut down, but sometimes has a hard time.  Declines any assistance at that time.   F/u as needed.        Nexplanon Insertion Procedure Patient identified, informed consent performed, consent signed.   Patient does understand that irregular bleeding is a very common side effect of this medication. She was advised to have backup contraception for one week after placement. Pregnancy test in clinic today was negative.  Appropriate time out taken.  Patient's left arm was prepped and draped in the usual sterile fashion. The ruler used to measure and mark insertion area.  Patient was prepped with alcohol swab and then injected with 3 ml of 1% lidocaine.  She was prepped with betadine, Nexplanon removed from packaging,  Device confirmed in needle, then inserted full length of needle and withdrawn per handbook instructions. Nexplanon was able to palpated in the patient's arm; patient palpated the insert herself. There was minimal blood loss.  Patient insertion site covered with guaze and a pressure bandage to reduce any bruising.  The patient tolerated the procedure well and was given post procedure instructions.     A total of 15 minutes were spent face-to-face with the patient during  this encounter and over half of that time dealt with counseling and coordination of care.   [name], MD [facility]

## 2017-10-28 ENCOUNTER — Other Ambulatory Visit: Payer: Self-pay

## 2017-10-28 ENCOUNTER — Emergency Department
Admission: EM | Admit: 2017-10-28 | Discharge: 2017-10-28 | Disposition: A | Discharge status: Home / Self Care | Payer: Medicaid Other | Attending: Emergency Medicine | Admitting: Emergency Medicine

## 2017-10-28 ENCOUNTER — Encounter: Payer: Self-pay | Admitting: Emergency Medicine

## 2017-10-28 DIAGNOSIS — F17210 Nicotine dependence, cigarettes, uncomplicated: Secondary | ICD-10-CM | POA: Diagnosis not present

## 2017-10-28 DIAGNOSIS — K529 Noninfective gastroenteritis and colitis, unspecified: Secondary | ICD-10-CM | POA: Diagnosis not present

## 2017-10-28 DIAGNOSIS — R112 Nausea with vomiting, unspecified: Secondary | ICD-10-CM

## 2017-10-28 DIAGNOSIS — Z79899 Other long term (current) drug therapy: Secondary | ICD-10-CM | POA: Diagnosis not present

## 2017-10-28 LAB — COMPREHENSIVE METABOLIC PANEL
ALT: 10 U/L — ABNORMAL LOW (ref 14–54)
AST: 19 U/L (ref 15–41)
Albumin: 4.3 g/dL (ref 3.5–5.0)
Alkaline Phosphatase: 76 U/L (ref 38–126)
Anion gap: 9 (ref 5–15)
BUN: 12 mg/dL (ref 6–20)
CO2: 22 mmol/L (ref 22–32)
Calcium: 9.4 mg/dL (ref 8.9–10.3)
Chloride: 110 mmol/L (ref 101–111)
Creatinine, Ser: 0.71 mg/dL (ref 0.44–1.00)
GFR calc Af Amer: >60 mL/min (ref >60)
GFR calc non Af Amer: >60 mL/min (ref >60)
Glucose, Bld: 124 mg/dL — ABNORMAL HIGH (ref 65–99)
Potassium: 4.1 mmol/L (ref 3.5–5.1)
Sodium: 141 mmol/L (ref 135–145)
Total Bilirubin: 1.8 mg/dL — ABNORMAL HIGH (ref 0.3–1.2)
Total Protein: 8 g/dL (ref 6.5–8.1)

## 2017-10-28 LAB — CBC
HCT: 43.7 % (ref 35.0–47.0)
Hemoglobin: 14.8 g/dL (ref 12.0–16.0)
MCH: 30.2 pg (ref 26.0–34.0)
MCHC: 33.9 g/dL (ref 32.0–36.0)
MCV: 89 fL (ref 80.0–100.0)
Platelets: 246 10*3/uL (ref 150–440)
RBC: 4.91 MIL/uL (ref 3.80–5.20)
RDW: 13 % (ref 11.5–14.5)
WBC: 10.7 10*3/uL (ref 3.6–11.0)

## 2017-10-28 LAB — LIPASE, BLOOD: Lipase: 28 U/L (ref 11–51)

## 2017-10-28 MED ORDER — ONDANSETRON 4 MG PO TBDP: 4.0000 mg | ORAL_TABLET | Freq: Three times a day (TID) | ORAL | 0 refills | Status: DC | PRN

## 2017-10-28 MED ORDER — ONDANSETRON HCL 4 MG/2ML IJ SOLN
INTRAMUSCULAR | Status: AC
  Administered 2017-10-28: 4 mg via INTRAVENOUS
  Filled 2017-10-28: qty 2

## 2017-10-28 MED ORDER — SODIUM CHLORIDE 0.9 % IV BOLUS (SEPSIS)
1000.0000 mL | Freq: Once | INTRAVENOUS | Status: AC
  Administered 2017-10-28: 1000 mL via INTRAVENOUS

## 2017-10-28 MED ORDER — ONDANSETRON HCL 4 MG/2ML IJ SOLN
4.0000 mg | Freq: Once | INTRAMUSCULAR | Status: AC
  Administered 2017-10-28: 4 mg via INTRAVENOUS

## 2017-10-28 MED ORDER — PROMETHAZINE HCL 25 MG/ML IJ SOLN
25.0000 mg | Freq: Once | INTRAMUSCULAR | Status: AC
  Administered 2017-10-28: 25 mg via INTRAVENOUS
  Filled 2017-10-28: qty 1

## 2017-10-28 NOTE — ED Notes (Signed)
Pt given ginger ale. IVF placed on pump at this time d/t pt having arm bent while sleeping.

## 2017-10-28 NOTE — ED Triage Notes (Signed)
States she woke up with n/v/d and heavy vaginal bleeding

## 2017-10-28 NOTE — ED Provider Notes (Signed)
[facility] Emergency Department Provider Note  Time seen: 10:07 AM  I have reviewed the triage vital signs and the nursing notes.   HISTORY  Chief Complaint Emesis and Vaginal Bleeding    HPI Vanessa Franco is a 37 y.o. female a past medical history of depression, anxiety, uterine fibroids who presents to the emergency department for nausea vomiting diarrhea.  According to the patient she awoke this morning around 5:00 feeling very nauseated with several episodes of vomiting and had several episodes of diarrhea.  Patient states diffuse abdominal cramping but denies any focal abdominal pain.  Denies any fever.  In review of systems questioning she also states vaginal bleeding ongoing for the past 3 days but states this is the time for her normal.,  History of uterine fibroids.   Past Medical History:  Diagnosis Date  . Anxiety   . Depression   . Fibroid   . History of D&C   . History of uterine fibroid 11/2013   [facility]    Patient Active Problem List   Diagnosis Date Noted  . Tobacco abuse 02/04/2016    Past Surgical History:  Procedure Laterality Date  . DILATION AND CURETTAGE OF UTERUS    . MYOMECTOMY  2015   Hysteroscopic    Prior to Admission medications   Medication Sig Start Date End Date Taking? Authorizing Provider  brompheniramine-pseudoephedrine-DM 30-2-10 MG/5ML syrup Take 5 mLs by mouth 4 (four) times daily as needed. Mixed with viscous lidocaine swish and swallow 09/20/17   Sable Feil, PA-C  fluconazole (DIFLUCAN) 150 MG tablet Take 1 tablet (150 mg total) by mouth once a week. 10/13/17   [name], MD  ibuprofen (ADVIL,MOTRIN) 800 MG tablet Take 1 tablet (800 mg total) by mouth every 8 (eight) hours as needed for moderate pain. 09/20/17   Sable Feil, PA-C  lidocaine (XYLOCAINE) 2 % solution Use as directed 5 mLs in the mouth or throat every 6 (six) hours as needed for mouth pain. Mixed with 5 mL of Bromfed-DM for swish and  swallow Patient not taking: Reported on 10/13/2017 09/20/17   Sable Feil, PA-C  norethindrone (MICRONOR,CAMILA,ERRIN) 0.35 MG tablet Take 1 tablet (0.35 mg total) by mouth daily. 03/16/17   [name], MD  Omega-3 Fatty Acids (FISH OIL) 1000 MG CAPS Take by mouth.    [provider]  Prenatal Vit-Fe Fumarate-FA (PRENATAL MULTIVITAMIN) TABS tablet Take 1 tablet by mouth daily at 12 noon.    [provider]  SUMAtriptan (IMITREX) 25 MG tablet Take 1 tablet (25 mg total) by mouth every 2 (two) hours as needed for migraine. May repeat in 2 hours if headache persists or recurs. Do not exceed 200 mg. Patient not taking: Reported on 10/13/2017 05/06/17   [name], MD  traMADol (ULTRAM) 50 MG tablet Take 1 tablet (50 mg total) by mouth every 6 (six) hours as needed. Patient not taking: Reported on 10/13/2017 05/06/17   [name], MD    Allergies  Allergen Reactions  . Ketorolac Other (See Comments)    Swelling, water retention  . Toradol [Ketorolac Tromethamine] Swelling  . Sulfa Antibiotics Rash    Family History  Problem Relation Age of Onset  . Diabetes Father   . Kidney failure Father   . Heart failure Father   . Rheum arthritis Mother   . Hypertension Mother     Social History Social History   Tobacco Use  . Smoking status: Current Some Day Smoker  Packs/day: 0.25  . Smokeless tobacco: Never Used  Substance Use Topics  . Alcohol use: No    Alcohol/week: 0.0 oz    Comment: rare  . Drug use: Yes    Types: Marijuana    Review of Systems Constitutional: Negative for fever. Eyes: Negative for visual complaints ENT: Negative for recent illness/congestion Cardiovascular: Negative for chest pain. Respiratory: Negative for shortness of breath. Gastrointestinal: Diffuse abdominal cramping.  Positive for nausea vomiting diarrhea Genitourinary: Negative for urinary compaints.  Positive for vaginal bleeding times 3 days. Musculoskeletal: Negative for  musculoskeletal complaints Skin: Negative for skin complaints  Neurological: Negative for headache All other ROS negative  ____________________________________________   PHYSICAL EXAM:  VITAL SIGNS: ED Triage Vitals  Enc Vitals Group     BP 10/28/17 0727 126/86     Pulse Rate 10/28/17 0727 92     Resp 10/28/17 0727 20     Temp 10/28/17 0727 98.3 F (36.8 C)     Temp Source 10/28/17 0727 Oral     SpO2 10/28/17 0727 100 %     Weight 10/28/17 0725 190 lb (86.2 kg)     Height 10/28/17 0725 5\' 7"  (1.702 m)     Head Circumference --      Peak Flow --      Pain Score 10/28/17 0725 9     Pain Loc --      Pain Edu? --      Excl. in [place]? --     Constitutional: Alert and oriented. Well appearing and in no distress. Eyes: Normal exam ENT   Head: Normocephalic and atraumatic.   Mouth/Throat: Mucous membranes are moist. Cardiovascular: Normal rate, regular rhythm. No murmur Respiratory: Normal respiratory effort without tachypnea nor retractions. Breath sounds are clear  Gastrointestinal: Soft, slight diffuse tenderness, no focal tenderness identified.  No rebound or guarding.  No distention.  Hyperactive bowel sounds. Musculoskeletal: Nontender with normal range of motion in all extremities.  Neurologic:  Normal speech and language. No gross focal neurologic deficits  Skin:  Skin is warm, dry and intact.  Psychiatric: Mood and affect are normal.   ____________________________________________   INITIAL IMPRESSION / ASSESSMENT AND PLAN / ED COURSE  Pertinent labs & imaging results that were available during my care of the patient were reviewed by me and considered in my medical decision making (see chart for details).  Patient presents the emergency department for nausea vomiting and diarrhea beginning around 5:00 this morning.  Differential would include gastroenteritis, enteritis, viral infection, gastritis, intra-abdominal pathology or infection.  Overall the patient  appears well with a largely nontender exam but does state cramping.  Initial lab work is reassuring, urinalysis and pregnancy test pending.  We will dose IV fluids nausea medication and continue to closely monitor.  Highly suspect gastroenteritis.  Patient's blood work is largely within normal limits.  Patient is now requesting to leave the emergency department.  Discussed that she did not yet provide a urine sample, states she knows she does not have a urinary tract infection and she wishes to leave.  We will discharge with a course of nausea medication and over-the-counter loperamide if needed.  Patient agreeable plan.  ____________________________________________   FINAL CLINICAL IMPRESSION(S) / ED DIAGNOSES  Gastroenteritis    Harvest Dark, MD 10/28/17 1311

## 2017-10-28 NOTE — ED Notes (Signed)
Pt informed that we are waiting for a urine sample. Pt states "I just peed like 30 minutes ago." pt informed we want to check to make sure pain isn't coming from a UTI. Pt stated "I would know if it's a UTI. It's not a UTI." pt asked if she would give urine sample anyway, "I don't have to go." pt asked if she wanted to go home and stated yes. Dr. Kerman Passey informed.

## 2017-10-28 NOTE — ED Notes (Signed)
Pt was ambulatory to the restroom. Pt requesting drink. This RN gave pt a cup of water after verifying with Shelly Rubenstein.

## 2017-11-30 ENCOUNTER — Ambulatory Visit: Payer: Medicaid Other | Admitting: Obstetrics and Gynecology

## 2017-12-15 ENCOUNTER — Ambulatory Visit: Payer: Medicaid Other | Admitting: Obstetrics and Gynecology

## 2017-12-27 ENCOUNTER — Other Ambulatory Visit: Payer: Self-pay | Admitting: Obstetrics and Gynecology

## 2018-01-11 ENCOUNTER — Ambulatory Visit: Payer: Medicaid Other | Admitting: Obstetrics and Gynecology

## 2018-01-11 ENCOUNTER — Encounter: Payer: Self-pay | Admitting: Obstetrics and Gynecology

## 2018-01-11 VITALS — BP 126/79 | HR 83 | Ht 67.0 in | Wt 196.5 lb

## 2018-01-11 DIAGNOSIS — G4709 Other insomnia: Secondary | ICD-10-CM | POA: Diagnosis not present

## 2018-01-11 DIAGNOSIS — N760 Acute vaginitis: Secondary | ICD-10-CM | POA: Diagnosis not present

## 2018-01-11 MED ORDER — FLUCONAZOLE 150 MG PO TABS: 150.0000 mg | ORAL_TABLET | ORAL | 0 refills | Status: DC

## 2018-01-11 MED ORDER — HYDROXYZINE PAMOATE 50 MG PO CAPS: 50.0000 mg | ORAL_CAPSULE | Freq: Every evening | ORAL | 3 refills | Status: DC | PRN

## 2018-01-11 NOTE — Progress Notes (Signed)
    GYNECOLOGY PROGRESS NOTE  Subjective:    Patient ID: Vanessa Franco, female    DOB: 1980/09/28, 37 y.o.   MRN: [id number]  HPI  Patient is a 37 y.o. G68P1021 female who presents for complaints of recurrent yeast infections. Over the past several months, she has had at least 3 yeast infections.  Patient reports taking a Diflucan last week but still came in to be checked out.   The following portions of the patient's history were reviewed and updated as appropriate: allergies, current medications, past family history, past medical history, past social history, past surgical history and problem list.  Review of Systems A comprehensive review of systems was negative except for: Behavioral/Psych: positive for insomnia - this has been ongoing for the past several months. She denies any recent stressors, changes in sleep habits.   She reports that she wakes up almost every hour or other hour, difficult to fall back asleep, at least several times per week.   Objective:   Blood pressure 126/79, pulse 83, height 5\' 7"  (1.702 m), weight 196 lb 8 oz (89.1 kg), last menstrual period 01/03/2018, not currently breastfeeding. General appearance: alert and no distress Pelvic: external genitalia normal, rectovaginal septum normal.  Vagina with very scant dark brown blood.  Cervix normal appearing, no lesions and no motion tenderness.  Bimanual exam not performed.     Assessment:   Recurrent yeast vaginitis  Plan:   - Recurrent yeast vaginitis - patient recently self-treated with a prior refill of Diflucan.  No No vaginal discharge on exam except scant brown blood.   If it occurs again, prescription sent for treatment of recurrent yeast vaginitis (6 months of weekly Diflucan treatments).  Insomnia -  Discussed sleep hygiene. Has tried Unisom, Melatonin, and  Benadryl with Zyrtec in the past with no relief..  She also reports trying Ambien in the past with no affect on her sleep.  Will prescribed  Vistaril 50 mg and can titrate as needed.  - Return to clinic if symptoms worsen or fail to improve.   [name], MD [facility]

## 2018-01-11 NOTE — Progress Notes (Signed)
Pt is having discharge w/o odor off and on possible yeast infection x 3 weeks. No itching no burning. No other concerns.

## 2018-03-16 ENCOUNTER — Emergency Department
Admission: EM | Admit: 2018-03-16 | Discharge: 2018-03-16 | Disposition: A | Discharge status: Home / Self Care | Payer: Medicaid Other | Attending: Emergency Medicine | Admitting: Emergency Medicine

## 2018-03-16 ENCOUNTER — Emergency Department: Payer: Medicaid Other

## 2018-03-16 ENCOUNTER — Encounter: Payer: Self-pay | Admitting: Emergency Medicine

## 2018-03-16 ENCOUNTER — Other Ambulatory Visit: Payer: Self-pay

## 2018-03-16 DIAGNOSIS — Y939 Activity, unspecified: Secondary | ICD-10-CM | POA: Diagnosis not present

## 2018-03-16 DIAGNOSIS — S0990XA Unspecified injury of head, initial encounter: Secondary | ICD-10-CM | POA: Diagnosis present

## 2018-03-16 DIAGNOSIS — F17210 Nicotine dependence, cigarettes, uncomplicated: Secondary | ICD-10-CM | POA: Insufficient documentation

## 2018-03-16 DIAGNOSIS — J0100 Acute maxillary sinusitis, unspecified: Secondary | ICD-10-CM | POA: Insufficient documentation

## 2018-03-16 DIAGNOSIS — Y999 Unspecified external cause status: Secondary | ICD-10-CM | POA: Diagnosis not present

## 2018-03-16 DIAGNOSIS — S0083XA Contusion of other part of head, initial encounter: Secondary | ICD-10-CM | POA: Insufficient documentation

## 2018-03-16 DIAGNOSIS — Y929 Unspecified place or not applicable: Secondary | ICD-10-CM | POA: Diagnosis not present

## 2018-03-16 DIAGNOSIS — S022XXA Fracture of nasal bones, initial encounter for closed fracture: Secondary | ICD-10-CM | POA: Insufficient documentation

## 2018-03-16 MED ORDER — NAPROXEN 500 MG PO TABS: 500.0000 mg | ORAL_TABLET | Freq: Two times a day (BID) | ORAL | Status: DC

## 2018-03-16 MED ORDER — AMOXICILLIN 500 MG PO CAPS: 500.0000 mg | ORAL_CAPSULE | Freq: Three times a day (TID) | ORAL | 0 refills | Status: DC

## 2018-03-16 NOTE — ED Notes (Signed)
First Nurse Note:  Patient complaining of left eye swelling and pain in left cheek starting on Sun.  Denies drainage.
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
## 2018-03-16 NOTE — Discharge Instructions (Signed)
Follow discharge care instruction take medication as directed. °
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
## 2018-05-03 ENCOUNTER — Encounter: Payer: Medicaid Other | Admitting: Obstetrics and Gynecology

## 2018-05-10 ENCOUNTER — Other Ambulatory Visit (HOSPITAL_COMMUNITY)
Admission: RE | Admit: 2018-05-10 | Discharge: 2018-05-10 | Disposition: A | Discharge status: Home / Self Care | Payer: Medicaid Other | Source: Ambulatory Visit | Attending: Obstetrics and Gynecology | Admitting: Obstetrics and Gynecology

## 2018-05-10 ENCOUNTER — Encounter: Payer: Self-pay | Admitting: Emergency Medicine

## 2018-05-10 ENCOUNTER — Ambulatory Visit: Payer: Medicaid Other | Admitting: Obstetrics and Gynecology

## 2018-05-10 ENCOUNTER — Other Ambulatory Visit: Payer: Self-pay

## 2018-05-10 ENCOUNTER — Encounter: Payer: Self-pay | Admitting: Obstetrics and Gynecology

## 2018-05-10 ENCOUNTER — Emergency Department
Admission: EM | Admit: 2018-05-10 | Discharge: 2018-05-10 | Disposition: A | Discharge status: Home / Self Care | Payer: Medicaid Other | Attending: Emergency Medicine | Admitting: Emergency Medicine

## 2018-05-10 VITALS — BP 134/85 | HR 74 | Ht 67.0 in | Wt 196.3 lb

## 2018-05-10 DIAGNOSIS — M25512 Pain in left shoulder: Secondary | ICD-10-CM | POA: Diagnosis present

## 2018-05-10 DIAGNOSIS — R238 Other skin changes: Secondary | ICD-10-CM

## 2018-05-10 DIAGNOSIS — N921 Excessive and frequent menstruation with irregular cycle: Secondary | ICD-10-CM | POA: Diagnosis not present

## 2018-05-10 DIAGNOSIS — Z79899 Other long term (current) drug therapy: Secondary | ICD-10-CM | POA: Insufficient documentation

## 2018-05-10 DIAGNOSIS — F17200 Nicotine dependence, unspecified, uncomplicated: Secondary | ICD-10-CM | POA: Insufficient documentation

## 2018-05-10 DIAGNOSIS — N898 Other specified noninflammatory disorders of vagina: Secondary | ICD-10-CM | POA: Insufficient documentation

## 2018-05-10 DIAGNOSIS — Z978 Presence of other specified devices: Secondary | ICD-10-CM

## 2018-05-10 DIAGNOSIS — R4586 Emotional lability: Secondary | ICD-10-CM | POA: Diagnosis not present

## 2018-05-10 DIAGNOSIS — Z975 Presence of (intrauterine) contraceptive device: Secondary | ICD-10-CM

## 2018-05-10 DIAGNOSIS — R233 Spontaneous ecchymoses: Secondary | ICD-10-CM

## 2018-05-10 MED ORDER — CYCLOBENZAPRINE HCL 5 MG PO TABS: ORAL_TABLET | ORAL | 0 refills | Status: DC

## 2018-05-10 MED ORDER — LIDOCAINE 5 % EX PTCH: 1.0000 | MEDICATED_PATCH | CUTANEOUS | 0 refills | Status: DC

## 2018-05-10 MED ORDER — IBUPROFEN 600 MG PO TABS: 600.0000 mg | ORAL_TABLET | Freq: Four times a day (QID) | ORAL | 0 refills | Status: DC | PRN

## 2018-05-10 MED ORDER — MEDROXYPROGESTERONE ACETATE 150 MG/ML IM SUSP
150.0000 mg | INTRAMUSCULAR | 3 refills | Status: DC
Start: 2018-05-10 — End: 2018-09-06

## 2018-05-10 MED ORDER — METRONIDAZOLE 0.75 % VA GEL: 1.0000 | Freq: Every day | VAGINAL | 1 refills | Status: DC

## 2018-05-10 NOTE — Progress Notes (Signed)
Pt stated that she think she have a bacterial infection. Pt stated that before her cycle she had thick discharged with an odor.
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
## 2018-05-10 NOTE — Progress Notes (Signed)
    GYNECOLOGY PROGRESS NOTE  Subjective:    Patient ID: Vanessa Franco, female    DOB: Nov 13, 1980, 37 y.o.   MRN: [id number]  HPI  Patient is a 37 y.o. G6P1021 female who presents for complaints of malodorous discharge for the past week. She also c/o persistent bleeding and mood changes with the Nexplanon and is desiring removal. Also notes easy bruising since the birth of her child last year.  The following portions of the patient's history were reviewed and updated as appropriate: allergies, current medications, past family history, past medical history, past social history, past surgical history and problem list.  Review of Systems Pertinent items noted in HPI and remainder of comprehensive ROS otherwise negative.   Objective:   Blood pressure 134/85, pulse 74, height 5\' 7"  (1.702 m), weight 196 lb 4.8 oz (89 kg), last menstrual period 05/06/2018, not currently breastfeeding. General appearance: alert and no distress Abdomen: soft, non-tender; bowel sounds normal; no masses,  no organomegaly Pelvic: external genitalia normal, rectovaginal septum normal.  Vagina with small amount of dark brown discharge.  Cervix normal appearing, no lesions and no motion tenderness.  Uterus mobile, nontender, normal shape and size.  Adnexae non-palpable, nontender bilaterally.  Extremities: left arm with Nexplanon palpable. Left leg with large darkk   Microscopic wet-mount exam shows numerous red blood cells, few epithelial cells. No trichomonads.   Assessment:   Vaginal discharge Breakthrough bleeding on Nexplanon Easy bruising Mood changes   Plan:   1. Vaginal discharge - inadequate wet prep due to numerous red blood cells, few epithelial cell. Vaginal swab sent for culture. Will treat empirically with Metrogel (patient's medication of choice) due to symptoms resembling bacterial vaginosis, as she has had this infection before.  2. Breakthrough bleeding on Nexplanon. Discusssed options  regarding estrogen supplementation (OCPs, Estradiol) or removal (patient's desire) .  Patient to be scheduled for 1 week Nexplanon removal. Notes that she will use Depo Provera once removed. May also help with mood symptoms.  3. Easy bruising - acquired after birth of her child year 1 year ago. Will monitor, and will check labs if needed.      Vanessa Maid, MD [facility]

## 2018-05-12 LAB — CERVICOVAGINAL ANCILLARY ONLY
Bacterial vaginitis: POSITIVE — AB
Candida vaginitis: NEGATIVE
Trichomonas: NEGATIVE

## 2018-05-18 ENCOUNTER — Ambulatory Visit: Payer: Medicaid Other | Admitting: Obstetrics and Gynecology

## 2018-05-18 ENCOUNTER — Encounter: Payer: Self-pay | Admitting: Obstetrics and Gynecology

## 2018-05-18 VITALS — BP 135/85 | HR 74 | Ht 67.0 in | Wt 203.8 lb

## 2018-05-18 DIAGNOSIS — Z3046 Encounter for surveillance of implantable subdermal contraceptive: Secondary | ICD-10-CM | POA: Diagnosis not present

## 2018-05-18 DIAGNOSIS — Z3042 Encounter for surveillance of injectable contraceptive: Secondary | ICD-10-CM | POA: Diagnosis not present

## 2018-05-18 MED ORDER — MEDROXYPROGESTERONE ACETATE 150 MG/ML IM SUSP
150.0000 mg | Freq: Once | INTRAMUSCULAR | Status: AC
  Administered 2018-05-18: 150 mg via INTRAMUSCULAR

## 2018-05-18 NOTE — Patient Instructions (Signed)

## 2018-05-18 NOTE — Progress Notes (Signed)
Pt is present today to have her nexplanon removed and start depo injections.

## 2018-05-18 NOTE — Progress Notes (Signed)
    GYNECOLOGY OFFICE PROCEDURE NOTE  Vanessa Franco is a 37 y.o. [id number] here for Nexplanon removal  Last pap smear was on 02/2017 and was normal.  No other gynecologic concerns.   Nexplanon Removal Patient identified, informed consent performed, consent signed.   Appropriate time out taken. Nexplanon site identified.  Area prepped in usual sterile fashon. Two ml of 1% lidocaine was used to anesthetize the area at the distal end of the implant. A small stab incision was made right beside the implant on the distal portion.  The Nexplanon rod was grasped using hemostats and removed without difficulty.  There was minimal blood loss. There were no complications.  1 ml of 1% lidocaine was injected around the incision for post-procedure analgesia.  Steri-strips were applied over the small incision.  A pressure bandage was applied to reduce any bruising.  The patient tolerated the procedure well and was given post procedure instructions.  Patient is planning to use Depo Provera for contraception, given injection today.  RTC in ~ 3 months for next injection. Cautioned on irregular bleeding due to transition of contraception.    [name], MD [facility]

## 2018-07-09 ENCOUNTER — Encounter: Payer: Self-pay | Admitting: Emergency Medicine

## 2018-07-09 ENCOUNTER — Emergency Department
Admission: EM | Admit: 2018-07-09 | Discharge: 2018-07-09 | Disposition: A | Discharge status: Home / Self Care | Payer: Medicaid Other | Attending: Emergency Medicine | Admitting: Emergency Medicine

## 2018-07-09 ENCOUNTER — Other Ambulatory Visit: Payer: Self-pay

## 2018-07-09 DIAGNOSIS — W25XXXA Contact with sharp glass, initial encounter: Secondary | ICD-10-CM | POA: Diagnosis not present

## 2018-07-09 DIAGNOSIS — Y9389 Activity, other specified: Secondary | ICD-10-CM | POA: Insufficient documentation

## 2018-07-09 DIAGNOSIS — F17200 Nicotine dependence, unspecified, uncomplicated: Secondary | ICD-10-CM | POA: Insufficient documentation

## 2018-07-09 DIAGNOSIS — F419 Anxiety disorder, unspecified: Secondary | ICD-10-CM | POA: Diagnosis not present

## 2018-07-09 DIAGNOSIS — Y929 Unspecified place or not applicable: Secondary | ICD-10-CM | POA: Diagnosis not present

## 2018-07-09 DIAGNOSIS — F329 Major depressive disorder, single episode, unspecified: Secondary | ICD-10-CM | POA: Insufficient documentation

## 2018-07-09 DIAGNOSIS — Y998 Other external cause status: Secondary | ICD-10-CM | POA: Insufficient documentation

## 2018-07-09 DIAGNOSIS — S51811A Laceration without foreign body of right forearm, initial encounter: Secondary | ICD-10-CM | POA: Insufficient documentation

## 2018-07-09 DIAGNOSIS — Z79899 Other long term (current) drug therapy: Secondary | ICD-10-CM | POA: Diagnosis not present

## 2018-07-09 MED ORDER — LIDOCAINE-EPINEPHRINE-TETRACAINE (LET) SOLUTION
3.0000 mL | Freq: Once | NASAL | Status: AC
  Administered 2018-07-09: 3 mL via TOPICAL
  Filled 2018-07-09: qty 3

## 2018-07-09 MED ORDER — CEPHALEXIN 500 MG PO CAPS: 500.0000 mg | ORAL_CAPSULE | Freq: Four times a day (QID) | ORAL | 0 refills | Status: AC
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
## 2018-08-03 NOTE — Progress Notes (Deleted)
Date last pap: 03/16/18 Last Depo-Provera: 8/28 19 Side Effects if any: ***. Serum HCG indicated? NA Depo-Provera 150 mg IM given by: AS, CMA Next appointment due 10/21/18-11/04/18

## 2018-08-05 ENCOUNTER — Ambulatory Visit: Payer: Medicaid Other | Admitting: Obstetrics and Gynecology

## 2018-08-08 ENCOUNTER — Ambulatory Visit: Payer: Medicaid Other

## 2018-09-06 ENCOUNTER — Encounter: Payer: Self-pay | Admitting: Obstetrics and Gynecology

## 2018-09-06 ENCOUNTER — Ambulatory Visit (INDEPENDENT_AMBULATORY_CARE_PROVIDER_SITE_OTHER): Payer: Medicaid Other | Admitting: Obstetrics and Gynecology

## 2018-09-06 VITALS — BP 151/94 | HR 90 | Ht 67.0 in | Wt 197.1 lb

## 2018-09-06 DIAGNOSIS — N912 Amenorrhea, unspecified: Secondary | ICD-10-CM

## 2018-09-06 DIAGNOSIS — R030 Elevated blood-pressure reading, without diagnosis of hypertension: Secondary | ICD-10-CM | POA: Diagnosis not present

## 2018-09-06 DIAGNOSIS — R0989 Other specified symptoms and signs involving the circulatory and respiratory systems: Secondary | ICD-10-CM

## 2018-09-06 LAB — POCT URINE PREGNANCY: Preg Test, Ur: NEGATIVE

## 2018-09-06 MED ORDER — MEDROXYPROGESTERONE ACETATE 10 MG PO TABS: 10.0000 mg | ORAL_TABLET | Freq: Every day | ORAL | 0 refills | Status: DC

## 2018-09-06 NOTE — Progress Notes (Signed)
Pt stated that since she stopped taking the depo injections she has not had a cycle since May 18, 2018.

## 2018-09-06 NOTE — Progress Notes (Signed)
    GYNECOLOGY PROGRESS NOTE  Subjective:    Patient ID: Vanessa Franco, female    DOB: Jun 19, 1981, 37 y.o.   MRN: [id number]  HPI  Patient is a 37 y.o. G48P1021 female who presents for complaints of amenorrhea since August (roughly the beginning of August).  Reports changing contraception from [place] to Depo Provera in August, only received 1 dose, however now is worried that she is no longer having a period.  States that she is considering conceiving again.  Took a home UPT 2 weeks ago which was negative.   The following portions of the patient's history were reviewed and updated as appropriate: allergies, current medications, past family history, past medical history, past social history, past surgical history and problem list.  Review of Systems Ears, nose, mouth, throat, and face: positive for nasal congestion and sore throat   Objective:   Blood pressure (!) 151/94, pulse 90, height 5\' 7"  (1.702 m), weight 197 lb 1.6 oz (89.4 kg). General appearance: alert and no distress Remainder of exam deferred.    Assessment:   Amenorrhea URI symptoms Elevated blood pressure  Plan:   - Amenorrhea likely secondary to chronic progesterone use (had abnormal bleeding on Nexplanon, now with amenorrhea s/p 1 dose of Depo Provera).  Now desiring a cycle as she is considering conceiving again.  UPT negative today. Will give trial of OCPs x 1 month to induce withdrawal bleed. Patient to f/u if no cycle next month.   - URI symptoms for the past several days, currently taking Sudafed and Tylenol products.  Has also had Alka seltzer once or twice.  Continued to encourage use.  - Elevated blood pressure reading noted today.  On review of chart, she had 1 other elevated BP in October at ED visit.  Unsure if today's visit is true reading due to history of recent use of Sudafed for URI symptoms. Will f/u at next visit.    [name], MD [facility] 09/06/2018 2:40 PM

## 2018-09-06 NOTE — Patient Instructions (Signed)
Preparing for Pregnancy If you are considering becoming pregnant, make an appointment to see your regular health care provider to learn how to prepare for a safe and healthy pregnancy (preconception care). During a preconception care visit, your health care provider will:  Do a complete physical exam, including a Pap test.  Take a complete medical history.  Give you information, answer your questions, and help you resolve problems.  Preconception checklist Medical history  Tell your health care provider about any current or past medical conditions. Your pregnancy or your ability to become pregnant may be affected by chronic conditions, such as diabetes, chronic hypertension, and thyroid problems.  Include your family's medical history as well as your partner's medical history.  Tell your health care provider about any history of STIs (sexually transmitted infections).These can affect your pregnancy. In some cases, they can be passed to your baby. Discuss any concerns that you have about STIs.  If indicated, discuss the benefits of genetic testing. This testing will show whether there are any genetic conditions that may be passed from you or your partner to your baby.  Tell your health care provider about: ? Any problems you have had with conception or pregnancy. ? Any medicines you take. These include vitamins, herbal supplements, and over-the-counter medicines. ? Your history of immunizations. Discuss any vaccinations that you may need.  Diet  Ask your health care provider what to include in a healthy diet that has a balance of nutrients. This is especially important when you are pregnant or preparing to become pregnant.  Ask your health care provider to help you reach a healthy weight before pregnancy. ? If you are overweight, you may be at higher risk for certain complications, such as high blood pressure, diabetes, and preterm birth. ? If you are underweight, you are more likely  to have a baby who has a low birth weight.  Lifestyle, work, and home  Let your health care provider know: ? About any lifestyle habits that you have, such as alcohol use, drug use, or smoking. ? About recreational activities that may put you at risk during pregnancy, such as downhill skiing and certain exercise programs. ? Tell your health care provider about any international travel, especially any travel to places with an active Zika virus outbreak. ? About harmful substances that you may be exposed to at work or at home. These include chemicals, pesticides, radiation, or even litter boxes. ? If you do not feel safe at home.  Mental health  Tell your health care provider about: ? Any history of mental health conditions, including feelings of depression, sadness, or anxiety. ? Any medicines that you take for a mental health condition. These include herbs and supplements.  Home instructions to prepare for pregnancy Lifestyle  Eat a balanced diet. This includes fresh fruits and vegetables, whole grains, lean meats, low-fat dairy products, healthy fats, and foods that are high in fiber. Ask to meet with a nutritionist or registered dietitian for assistance with meal planning and goals.  Get regular exercise. Try to be active for at least 30 minutes a day on most days of the week. Ask your health care provider which activities are safe during pregnancy.  Do not use any products that contain nicotine or tobacco, such as cigarettes and e-cigarettes. If you need help quitting, ask your health care provider.  Do not drink alcohol.  Do not take illegal drugs.  Maintain a healthy weight. Ask your health care provider what weight range is   right for you.  General instructions  Keep an accurate record of your menstrual periods. This makes it easier for your health care provider to determine your baby's due date.  Begin taking prenatal vitamins and folic acid supplements daily as directed by  your health care provider.  Manage any chronic conditions, such as high blood pressure and diabetes, as told by your health care provider. This is important.  How do I know that I am pregnant? You may be pregnant if you have been sexually active and you miss your period. Symptoms of early pregnancy include:  Mild cramping.  Very light vaginal bleeding (spotting).  Feeling unusually tired.  Nausea and vomiting (morning sickness).  If you have any of these symptoms and you suspect that you might be pregnant, you can take a home pregnancy test. These tests check for a hormone in your urine (human chorionic gonadotropin, or hCG). A woman's body begins to make this hormone during early pregnancy. These tests are very accurate. Wait until at least the first day after you miss your period to take one. If the test shows that you are pregnant (you get a positive result), call your health care provider to make an appointment for prenatal care. What should I do if I become pregnant?  Make an appointment with your health care provider as soon as you suspect you are pregnant.  Do not use any products that contain nicotine, such as cigarettes, chewing tobacco, and e-cigarettes. If you need help quitting, ask your health care provider.  Do not drink alcoholic beverages. Alcohol is related to a number of birth defects.  Avoid toxic odors and chemicals.  You may continue to have sexual intercourse if it does not cause pain or other problems, such as vaginal bleeding. This information is not intended to replace advice given to you by your health care provider. Make sure you discuss any questions you have with your health care provider. Document Released: 08/20/2008 Document Revised: 05/05/2016 Document Reviewed: 03/29/2016 Elsevier Interactive Patient Education  2018 [employer]vier Inc.  

## 2018-09-21 DIAGNOSIS — R45851 Suicidal ideations: Secondary | ICD-10-CM | POA: Insufficient documentation

## 2018-09-21 DIAGNOSIS — F17200 Nicotine dependence, unspecified, uncomplicated: Secondary | ICD-10-CM | POA: Insufficient documentation

## 2018-09-21 DIAGNOSIS — F329 Major depressive disorder, single episode, unspecified: Secondary | ICD-10-CM | POA: Insufficient documentation

## 2018-09-22 ENCOUNTER — Other Ambulatory Visit: Payer: Self-pay

## 2018-09-22 ENCOUNTER — Emergency Department
Admission: EM | Admit: 2018-09-22 | Discharge: 2018-09-22 | Disposition: A | Discharge status: Home / Self Care | Payer: Medicaid Other | Attending: Emergency Medicine | Admitting: Emergency Medicine

## 2018-09-22 DIAGNOSIS — F331 Major depressive disorder, recurrent, moderate: Secondary | ICD-10-CM

## 2018-09-22 DIAGNOSIS — F10920 Alcohol use, unspecified with intoxication, uncomplicated: Secondary | ICD-10-CM

## 2018-09-22 LAB — COMPREHENSIVE METABOLIC PANEL
ALT: 11 U/L (ref 0–44)
AST: 16 U/L (ref 15–41)
Albumin: 4.3 g/dL (ref 3.5–5.0)
Alkaline Phosphatase: 68 U/L (ref 38–126)
Anion gap: 9 (ref 5–15)
BUN: 10 mg/dL (ref 6–20)
CO2: 23 mmol/L (ref 22–32)
Calcium: 9.1 mg/dL (ref 8.9–10.3)
Chloride: 107 mmol/L (ref 98–111)
Creatinine, Ser: 0.57 mg/dL (ref 0.44–1.00)
GFR calc Af Amer: >60 mL/min (ref >60)
GFR calc non Af Amer: >60 mL/min (ref >60)
Glucose, Bld: 103 mg/dL — ABNORMAL HIGH (ref 70–99)
Potassium: 3.8 mmol/L (ref 3.5–5.1)
Sodium: 139 mmol/L (ref 135–145)
Total Bilirubin: 1.3 mg/dL — ABNORMAL HIGH (ref 0.3–1.2)
Total Protein: 7.7 g/dL (ref 6.5–8.1)

## 2018-09-22 LAB — CBC
HCT: 40.9 % (ref 36.0–46.0)
Hemoglobin: 13.7 g/dL (ref 12.0–15.0)
MCH: 30.2 pg (ref 26.0–34.0)
MCHC: 33.5 g/dL (ref 30.0–36.0)
MCV: 90.1 fL (ref 80.0–100.0)
Platelets: 244 10*3/uL (ref 150–400)
RBC: 4.54 MIL/uL (ref 3.87–5.11)
RDW: 12.2 % (ref 11.5–15.5)
WBC: 9.7 10*3/uL (ref 4.0–10.5)
nRBC: 0 % (ref 0.0–0.2)

## 2018-09-22 LAB — SALICYLATE LEVEL: Salicylate Lvl: <7 mg/dL (ref 2.8–30.0)

## 2018-09-22 LAB — ACETAMINOPHEN LEVEL: Acetaminophen (Tylenol), Serum: <10 ug/mL — ABNORMAL LOW (ref 10–30)

## 2018-09-22 LAB — ETHANOL: Alcohol, Ethyl (B): 138 mg/dL — ABNORMAL HIGH (ref <10)

## 2018-09-22 NOTE — ED Triage Notes (Addendum)
Pt arrives to ED via POV from home with c/o "depression, mental illness, a lot of things". Pt reports being d/x'd in the past with PTSD and anxiety. Pt states she is here tonight "from dealing with deep depression". Pt reports SI, but denies HI, no AVH. Pt reports (+) ETOH use PTA, but denies drug use. Pt reports previous SA "twenty years ago"; no current t/x or medications r/x'd.

## 2018-09-22 NOTE — ED Provider Notes (Signed)
[facility] Emergency Department Provider Note   ____________________________________________   First MD Initiated Contact with Patient 09/22/18 0112     (approximate)  I have reviewed the triage vital signs and the nursing notes.   HISTORY  Chief Complaint Depression and Mental Health Problem    HPI Vanessa Franco is a 38 y.o. female who presents to the ED from home with a chief complaint of depression.  Patient reports prior history of PTSD and anxiety.  States she is dealing with deep depression.  Versus SI without plan.  Denies HI/AH/VH.  Voices no medical complaints.   Past Medical History:  Diagnosis Date  . Anxiety   . Depression   . Fibroid   . History of D&C   . History of uterine fibroid 11/2013   [facility]    Patient Active Problem List   Diagnosis Date Noted  . Tobacco abuse 02/04/2016    Past Surgical History:  Procedure Laterality Date  . DILATION AND CURETTAGE OF UTERUS    . MYOMECTOMY  2015   Hysteroscopic    Prior to Admission medications   Medication Sig Start Date End Date Taking? Authorizing Provider  metroNIDAZOLE (METROGEL) 0.75 % vaginal gel Place 1 Applicatorful vaginally at bedtime. Apply one applicatorful to vagina at bedtime for 5 days 05/10/18   [name], MD  Multiple Vitamins-Minerals (HAIR SKIN AND NAILS FORMULA PO) Take by mouth.    [provider]    Allergies Ketorolac; Toradol [ketorolac tromethamine]; and Sulfa antibiotics  Family History  Problem Relation Age of Onset  . Diabetes Father   . Kidney failure Father   . Heart failure Father   . Rheum arthritis Mother   . Hypertension Mother     Social History Social History   Tobacco Use  . Smoking status: Current Some Day Smoker    Packs/day: 0.25  . Smokeless tobacco: Never Used  Substance Use Topics  . Alcohol use: Yes    Alcohol/week: 0.0 standard drinks    Comment: occass  . Drug use: Not Currently    Types: Marijuana     Review of Systems  Constitutional: No fever/chills Eyes: No visual changes. ENT: No sore throat. Cardiovascular: Denies chest pain. Respiratory: Denies shortness of breath. Gastrointestinal: No abdominal pain.  No nausea, no vomiting.  No diarrhea.  No constipation. Genitourinary: Negative for dysuria. Musculoskeletal: Negative for back pain. Skin: Negative for rash. Neurological: Negative for headaches, focal weakness or numbness. Psychiatric:Positive for depression with SI.  ____________________________________________   PHYSICAL EXAM:  VITAL SIGNS: ED Triage Vitals  Enc Vitals Group     BP 09/22/18 0012 (!) 140/92     Pulse Rate 09/22/18 0012 (!) 104     Resp 09/22/18 0012 17     Temp 09/22/18 0012 98.1 F (36.7 C)     Temp Source 09/22/18 0012 Oral     SpO2 09/22/18 0012 99 %     Weight 09/22/18 0010 193 lb (87.5 kg)     Height 09/22/18 0010 5\' 7"  (1.702 m)     Head Circumference --      Peak Flow --      Pain Score 09/22/18 0010 0     Pain Loc --      Pain Edu? --      Excl. in [place]? --     Constitutional: Asleep, awakened for exam.  Alert and oriented. Well appearing and in no acute distress. Eyes: Conjunctivae are normal. PERRL. EOMI. Head: Atraumatic.  Nose: No congestion/rhinnorhea. Mouth/Throat: Mucous membranes are moist.  Oropharynx non-erythematous. Neck: No stridor.   Cardiovascular: Normal rate, regular rhythm. Grossly normal heart sounds.  Good peripheral circulation. Respiratory: Normal respiratory effort.  No retractions. Lungs CTAB. Gastrointestinal: Soft and nontender. No distention. No abdominal bruits. No CVA tenderness. Musculoskeletal: No lower extremity tenderness nor edema.  No joint effusions. Neurologic:  Normal speech and language. No gross focal neurologic deficits are appreciated. No gait instability. Skin:  Skin is warm, dry and intact. No rash noted. Psychiatric: Mood and affect are flat. Speech and behavior are  normal.  ____________________________________________   LABS (all labs ordered are listed, but only abnormal results are displayed)  Labs Reviewed  COMPREHENSIVE METABOLIC PANEL - Abnormal; Notable for the following components:      Result Value   Glucose, Bld 103 (*)    Total Bilirubin 1.3 (*)    All other components within normal limits  ETHANOL - Abnormal; Notable for the following components:   Alcohol, Ethyl (B) 138 (*)    All other components within normal limits  ACETAMINOPHEN LEVEL - Abnormal; Notable for the following components:   Acetaminophen (Tylenol), Serum <10 (*)    All other components within normal limits  SALICYLATE LEVEL  CBC  URINE DRUG SCREEN, QUALITATIVE ([facility] ONLY)  POC URINE PREG, ED   ____________________________________________  EKG  None ____________________________________________  RADIOLOGY  ED MD interpretation: None  Official radiology report(s): No results found.  ____________________________________________   PROCEDURES  Procedure(s) performed: None  Procedures  Critical Care performed: No  ____________________________________________   INITIAL IMPRESSION / ASSESSMENT AND PLAN / ED COURSE  As part of my medical decision making, I reviewed the following data within the [place] notes reviewed and incorporated, Labs reviewed, Old chart reviewed, A consult was requested and obtained from this/these consultant(s) Psychiatry and Notes from prior ED visits   38 year old female with PTSD and anxiety who presents with depression with vague SI.  Laboratory results unremarkable except for elevated EtOH.  Patient is medically cleared pending psychiatric evaluation and disposition.  Clinical Course as of Sep 22 645  Thu Sep 22, 2018  9937 No further events.  Patient remains voluntary in the emergency department pending tele-psychiatry evaluation and disposition.   [JS]    Clinical Course User  Index [JS] Paulette Blanch, MD     ____________________________________________   FINAL CLINICAL IMPRESSION(S) / ED DIAGNOSES  Final diagnoses:  Moderate episode of recurrent major depressive disorder ([place])  Alcoholic intoxication without complication [facility])     ED Discharge Orders    None       Note:  This document was prepared using Dragon voice recognition software and may include unintentional dictation errors.    Paulette Blanch, MD 09/22/18 [phone]

## 2018-09-22 NOTE — ED Provider Notes (Signed)
The patient has been evaluated at bedside by [facility], psychiatry.  Patient is clinically stable.  Not felt to be a danger to self or others.  No SI or Hi.  No indication for inpatient psychiatric admission at this time.  Appropriate for continued outpatient therapy.    Merlyn Lot, MD 09/22/18 [phone]

## 2018-09-22 NOTE — BH Assessment (Addendum)
Assessment Note  Vanessa Franco is an 38 y.o. female. Pt awakened to voice and was agreeable to complete assessment. Pt was difficult to understand much of the time and timelines were inconsistent. Pt has acknowledged a history of PTSD and Anxiety. Pt was very somnolent and proved to be a poor historian. The following information is what the clinician was able to ascertain from the pt; She shares that she currently takes Trazodone. It is unclear if this medication is prescribed by a psychiatrist or her primary care provider as she denies seeing a psychiatrist at the moment. Patient endorsed is experiencing suicidal ideation with no plan. She denied any history of suicide attempts but states that she decided to come in today because she has a two-year-old daughter at home with depends on her. Per her report she's felt suicidal for "a couple of days." Patient denies the use of any mood-altering substances but states that she does have a history of substance abuse. Pt admitted to drinking on today, with a blood alcohol level of 131. Pt presents as irritable and guarded .The history is limited by the condition of the patientPt presenting with impaired insight, judgment and impulse control, further evaluation is recommended.  Diagnosis: Depression   Past Medical History:  Past Medical History:  Diagnosis Date  . Anxiety   . Depression   . Fibroid   . History of D&C   . History of uterine fibroid 11/2013   [facility]    Past Surgical History:  Procedure Laterality Date  . DILATION AND CURETTAGE OF UTERUS    . MYOMECTOMY  2015   Hysteroscopic    Family History:  Family History  Problem Relation Age of Onset  . Diabetes Father   . Kidney failure Father   . Heart failure Father   . Rheum arthritis Mother   . Hypertension Mother     Social History:  reports that she has been smoking. She has been smoking about 0.25 packs per day. She has never used smokeless tobacco. She reports current alcohol  use. She reports previous drug use. Drug: Marijuana.  Additional Social History:  Alcohol / Drug Use Pain Medications: SEE MAR Prescriptions: SEE MAR Over the Counter: SEE MAR History of alcohol / drug use?: Yes Longest period of sobriety (when/how long): Unknown  Substance #1 Name of Substance 1: Alcohol  1 - Age of First Use: UTA 1 - Amount (size/oz): More than a pint  1 - Frequency: daily  1 - Duration: one month  1 - Last Use / Amount: PTA   CIWA: CIWA-Ar BP: (!) 140/92 Pulse Rate: (!) 104 COWS:    Allergies:  Allergies  Allergen Reactions  . Ketorolac Other (See Comments)    Swelling, water retention  . Toradol [Ketorolac Tromethamine] Swelling  . Sulfa Antibiotics Rash    Home Medications: (Not in a hospital admission)   OB/GYN Status:  No LMP recorded (lmp unknown).  General Assessment Data Location of Assessment: [facility] ED TTS Assessment: In system Is this a Tele or Face-to-Face Assessment?: Face-to-Face Is this an Initial Assessment or a Re-assessment for this encounter?: Initial Assessment Patient Accompanied by:: N/A Language Other than English: No Marital status: Other (comment)(UTA) Living Arrangements: Other relatives Can pt return to current living arrangement?: Yes Admission Status: Voluntary Referral Source: Self/Family/Friend Insurance type: None  Medical Screening Exam ([place]) Medical Exam completed: Yes  Crisis Care Plan Living Arrangements: Other relatives Name of Psychiatrist: none  Name of Therapist: none  Education Status Is patient currently in school?: No Is the patient employed, unemployed or receiving disability?: Unemployed  Risk to self with the past 6 months Suicidal Ideation: Yes-Currently Present Has patient been a risk to self within the past 6 months prior to admission? : Yes Suicidal Intent: Yes-Currently Present Has patient had any suicidal intent within the past 6 months prior to admission? : Yes Is  patient at risk for suicide?: Yes Suicidal Plan?: No Has patient had any suicidal plan within the past 6 months prior to admission? : No What has been your use of drugs/alcohol within the last 12 months?: Alcohol  Previous Attempts/Gestures: No How many times?: 0 Intentional Self Injurious Behavior: None Family Suicide History: Unable to assess Recent stressful life event(s): Conflict (Comment), Job Loss Persecutory voices/beliefs?: No Depression: Yes Depression Symptoms: Insomnia, Feeling angry/irritable, Feeling worthless/self pity, Loss of interest in usual pleasures Substance abuse history and/or treatment for substance abuse?: Yes Suicide prevention information given to non-admitted patients: Not applicable  Risk to Others within the past 6 months Homicidal Ideation: No Does patient have any lifetime risk of violence toward others beyond the six months prior to admission? : No Thoughts of Harm to Others: No Current Homicidal Intent: No Current Homicidal Plan: No Access to Homicidal Means: No History of harm to others?: No Assessment of Violence: None Noted Violent Behavior Description: none  Does patient have access to weapons?: No Criminal Charges Pending?: No Does patient have a court date: No Is patient on probation?: No  Psychosis Hallucinations: None noted Delusions: None noted  Mental Status Report Appearance/Hygiene: In scrubs Eye Contact: Poor Motor Activity: Freedom of movement Speech: Logical/coherent Level of Consciousness: Drowsy Mood: Irritable Affect: Irritable, Depressed Anxiety Level: None Thought Processes: Coherent Judgement: Partial Orientation: Time, Place, Person, Situation Obsessive Compulsive Thoughts/Behaviors: None  Cognitive Functioning Concentration: Fair Memory: Remote Intact, Recent Intact Is patient IDD: No Insight: Poor Impulse Control: Fair Appetite: Poor Have you had any weight changes? : No Change Sleep: Decreased Total  Hours of Sleep: 2 Vegetative Symptoms: Unable to Assess  ADLScreening [facility] Assessment Services) Patient's cognitive ability adequate to safely complete daily activities?: Yes Patient able to express need for assistance with ADLs?: Yes Independently performs ADLs?: Yes (appropriate for developmental age)  Prior Inpatient Therapy Prior Inpatient Therapy: Yes Prior Therapy Dates: Unknown  Prior Therapy Facilty/Provider(s): [facility] Reason for Treatment: Depression   Prior Outpatient Therapy Prior Outpatient Therapy: No Does patient have an ACCT team?: No Does patient have Intensive In-House Services?  : No Does patient have [facility] services? : No Does patient have P4CC services?: No  ADL Screening (condition at time of admission) Patient's cognitive ability adequate to safely complete daily activities?: Yes Patient able to express need for assistance with ADLs?: Yes Independently performs ADLs?: Yes (appropriate for developmental age)             Regulatory affairs officer (For Healthcare) Does Patient Have a Medical Advance Directive?: No          Disposition:  Disposition Initial Assessment Completed for this Encounter: Yes Patient referred to: Other (Comment)(Consult with [facility])  On Site Evaluation by:   Reviewed with Physician:    Laretta Alstrom 09/22/2018 4:21 AM

## 2018-09-22 NOTE — ED Notes (Signed)
Report to SOC MD 

## 2018-09-22 NOTE — ED Notes (Signed)
Dressed pt out in [employer] by myself and [place], EDT.  Labs drawn and sent to lab.  Pt very tearful.  Pts belongings, which include pants, underwear, socks, flipflops, 2 shirts, bra, red jacket.  Pts black purse, keys, cellphone went home with pts aunt.  Pt ambulated to room [facility]

## 2019-06-23 ENCOUNTER — Encounter: Payer: Self-pay | Admitting: Obstetrics and Gynecology

## 2019-06-23 ENCOUNTER — Ambulatory Visit (INDEPENDENT_AMBULATORY_CARE_PROVIDER_SITE_OTHER): Payer: Medicaid Other | Admitting: Obstetrics and Gynecology

## 2019-06-23 ENCOUNTER — Other Ambulatory Visit: Payer: Self-pay

## 2019-06-23 VITALS — BP 161/93 | HR 89 | Ht 67.0 in | Wt 192.1 lb

## 2019-06-23 DIAGNOSIS — N926 Irregular menstruation, unspecified: Secondary | ICD-10-CM

## 2019-06-23 DIAGNOSIS — O10919 Unspecified pre-existing hypertension complicating pregnancy, unspecified trimester: Secondary | ICD-10-CM | POA: Insufficient documentation

## 2019-06-23 DIAGNOSIS — F419 Anxiety disorder, unspecified: Secondary | ICD-10-CM | POA: Diagnosis not present

## 2019-06-23 DIAGNOSIS — O10011 Pre-existing essential hypertension complicating pregnancy, first trimester: Secondary | ICD-10-CM | POA: Diagnosis not present

## 2019-06-23 DIAGNOSIS — Z720 Tobacco use: Secondary | ICD-10-CM

## 2019-06-23 DIAGNOSIS — I10 Essential (primary) hypertension: Secondary | ICD-10-CM | POA: Insufficient documentation

## 2019-06-23 DIAGNOSIS — O09521 Supervision of elderly multigravida, first trimester: Secondary | ICD-10-CM

## 2019-06-23 HISTORY — DX: Essential (primary) hypertension: I10

## 2019-06-23 LAB — POCT URINE PREGNANCY: Preg Test, Ur: POSITIVE — AB

## 2019-06-23 MED ORDER — PROVIDA DHA 16-16-1.25-110 MG PO CAPS: 1.0000 | ORAL_CAPSULE | Freq: Every day | ORAL | 11 refills | Status: DC

## 2019-06-23 MED ORDER — LABETALOL HCL 200 MG PO TABS: 200.0000 mg | ORAL_TABLET | Freq: Two times a day (BID) | ORAL | 11 refills | Status: DC

## 2019-06-23 NOTE — Progress Notes (Signed)
  Subjective:    Vanessa Franco is a 38 y.o. G31P1021 female who presents for evaluation of amenorrhea. She believes she could be pregnant. Pregnancy is desired. Sexual Activity: single partner, contraception: none (last Depo Provera injection in November last year). Current symptoms also include: fatigue and positive home pregnancy test. Last period was normal.   Patient's last menstrual period was 05/14/2019. The following portions of the patient's history were reviewed and updated as appropriate: allergies, current medications, past family history, past medical history, past social history, past surgical history and problem list.  Review of Systems Pertinent items noted in HPI and remainder of comprehensive ROS otherwise negative.       Objective:    BP (!) 161/93   Pulse 89   Ht 5\' 7"  (1.702 m)   Wt 192 lb 1.6 oz (87.1 kg)   LMP 05/14/2019   BMI 30.09 kg/m .  Repeat BP 142/88  General: alert, no distress and no acute distress    Lab Review Urine HCG: positive    Assessment:   - Absence of menstruation.   - Tobacco abuse - Anxiety - Elevated BP  Plan:   - Pregnancy Test: Positive: EDC: 02/18/2020, with EGA 5.5 weeks. Briefly discussed pre-natal care options.First trimester info . Encouraged well-balanced diet, plenty of rest when needed, pre-natal vitamins daily and walking for exercise. Discussed self-help for nausea, avoiding OTC medications until consulting provider or pharmacist, other than Tylenol as needed, minimal caffeine (1-2 cups daily) and avoiding alcohol. She will schedule her initial OB visit in the next month with her PCP or OB provider. Feel free to call with any questions.  - Tobacco abuse, currently smoking 1/2 ppd. Discussed smoking cessation. Patient notes that she plans to quit today.  Is planning on cold-turkey method.  Discussed alternative methods for quitting as well.  - Anxiety, patient notes a history of anxiety and was prescribed Zoloft by her PCP  but has not really used. Discussed anxiety in the pregnancy.  Patient does not feel like she needs anything at this time. Will perform baseline screening at NOB visit.  - Elevated BP, patient notes some elevated BPs in last pregnancy.  Repeat BP still elevated. Likely with pre-existing HTN.  Will initiate on Labetalol 200 mg.     [name], MD [facility]

## 2019-06-23 NOTE — Patient Instructions (Signed)
First Trimester of Pregnancy The first trimester of pregnancy is from week 1 until the end of week 13 (months 1 through 3). A week after a sperm fertilizes an egg, the egg will implant on the wall of the uterus. This embryo will begin to develop into a baby. Genes from you and your partner will form the baby. The female genes will determine whether the baby will be a boy or a girl. At 6-8 weeks, the eyes and face will be formed, and the heartbeat can be seen on ultrasound. At the end of 12 weeks, all the baby's organs will be formed. Now that you are pregnant, you will want to do everything you can to have a healthy baby. Two of the most important things are to get good prenatal care and to follow your health care provider's instructions. Prenatal care is all the medical care you receive before the baby's birth. This care will help prevent, find, and treat any problems during the pregnancy and childbirth. Body changes during your first trimester Your body goes through many changes during pregnancy. The changes vary from woman to woman.  You may gain or lose a couple of pounds at first.  You may feel sick to your stomach (nauseous) and you may throw up (vomit). If the vomiting is uncontrollable, call your health care provider.  You may tire easily.  You may develop headaches that can be relieved by medicines. All medicines should be approved by your health care provider.  You may urinate more often. Painful urination may mean you have a bladder infection.  You may develop heartburn as a result of your pregnancy.  You may develop constipation because certain hormones are causing the muscles that push stool through your intestines to slow down.  You may develop hemorrhoids or swollen veins (varicose veins).  Your breasts may begin to grow larger and become tender. Your nipples may stick out more, and the tissue that surrounds them (areola) may become darker.  Your gums may bleed and may be  sensitive to brushing and flossing.  Dark spots or blotches (chloasma, mask of pregnancy) may develop on your face. This will likely fade after the baby is born.  Your menstrual periods will stop.  You may have a loss of appetite.  You may develop cravings for certain kinds of food.  You may have changes in your emotions from day to day, such as being excited to be pregnant or being concerned that something may go wrong with the pregnancy and baby.  You may have more vivid and strange dreams.  You may have changes in your hair. These can include thickening of your hair, rapid growth, and changes in texture. Some women also have hair loss during or after pregnancy, or hair that feels dry or thin. Your hair will most likely return to normal after your baby is born. What to expect at prenatal visits During a routine prenatal visit:  You will be weighed to make sure you and the baby are growing normally.  Your blood pressure will be taken.  Your abdomen will be measured to track your baby's growth.  The fetal heartbeat will be listened to between weeks 10 and 14 of your pregnancy.  Test results from any previous visits will be discussed. Your health care provider may ask you:  How you are feeling.  If you are feeling the baby move.  If you have had any abnormal symptoms, such as leaking fluid, bleeding, severe headaches, or abdominal   cramping.  If you are using any tobacco products, including cigarettes, chewing tobacco, and electronic cigarettes.  If you have any questions. Other tests that may be performed during your first trimester include:  Blood tests to find your blood type and to check for the presence of any previous infections. The tests will also be used to check for low iron levels (anemia) and protein on red blood cells (Rh antibodies). Depending on your risk factors, or if you previously had diabetes during pregnancy, you may have tests to check for high blood sugar  that affects pregnant women (gestational diabetes).  Urine tests to check for infections, diabetes, or protein in the urine.  An ultrasound to confirm the proper growth and development of the baby.  Fetal screens for spinal cord problems (spina bifida) and Down syndrome.  HIV (human immunodeficiency virus) testing. Routine prenatal testing includes screening for HIV, unless you choose not to have this test.  You may need other tests to make sure you and the baby are doing well. Follow these instructions at home: Medicines  Follow your health care provider's instructions regarding medicine use. Specific medicines may be either safe or unsafe to take during pregnancy.  Take a prenatal vitamin that contains at least 600 micrograms (mcg) of folic acid.  If you develop constipation, try taking a stool softener if your health care provider approves. Eating and drinking   Eat a balanced diet that includes fresh fruits and vegetables, whole grains, good sources of protein such as meat, eggs, or tofu, and low-fat dairy. Your health care provider will help you determine the amount of weight gain that is right for you.  Avoid raw meat and uncooked cheese. These carry germs that can cause birth defects in the baby.  Eating four or five small meals rather than three large meals a day may help relieve nausea and vomiting. If you start to feel nauseous, eating a few soda crackers can be helpful. Drinking liquids between meals, instead of during meals, also seems to help ease nausea and vomiting.  Limit foods that are high in fat and processed sugars, such as fried and sweet foods.  To prevent constipation: ? Eat foods that are high in fiber, such as fresh fruits and vegetables, whole grains, and beans. ? Drink enough fluid to keep your urine clear or pale yellow. Activity  Exercise only as directed by your health care provider. Most women can continue their usual exercise routine during  pregnancy. Try to exercise for 30 minutes at least 5 days a week. Exercising will help you: ? Control your weight. ? Stay in shape. ? Be prepared for labor and delivery.  Experiencing pain or cramping in the lower abdomen or lower back is a good sign that you should stop exercising. Check with your health care provider before continuing with normal exercises.  Try to avoid standing for long periods of time. Move your legs often if you must stand in one place for a long time.  Avoid heavy lifting.  Wear low-heeled shoes and practice good posture.  You may continue to have sex unless your health care provider tells you not to. Relieving pain and discomfort  Wear a good support bra to relieve breast tenderness.  Take warm sitz baths to soothe any pain or discomfort caused by hemorrhoids. Use hemorrhoid cream if your health care provider approves.  Rest with your legs elevated if you have leg cramps or low back pain.  If you develop varicose veins in   your legs, wear support hose. Elevate your feet for 15 minutes, 3-4 times a day. Limit salt in your diet. Prenatal care  Schedule your prenatal visits by the twelfth week of pregnancy. They are usually scheduled monthly at first, then more often in the last 2 months before delivery.  Write down your questions. Take them to your prenatal visits.  Keep all your prenatal visits as told by your health care provider. This is important. Safety  Wear your seat belt at all times when driving.  Make a list of emergency phone numbers, including numbers for family, friends, the hospital, and police and fire departments. General instructions  Ask your health care provider for a referral to a local prenatal education class. Begin classes no later than the beginning of month 6 of your pregnancy.  Ask for help if you have counseling or nutritional needs during pregnancy. Your health care provider can offer advice or refer you to specialists for help  with various needs.  Do not use hot tubs, steam rooms, or saunas.  Do not douche or use tampons or scented sanitary pads.  Do not cross your legs for long periods of time.  Avoid cat litter boxes and soil used by cats. These carry germs that can cause birth defects in the baby and possibly loss of the fetus by miscarriage or stillbirth.  Avoid all smoking, herbs, alcohol, and medicines not prescribed by your health care provider. Chemicals in these products affect the formation and growth of the baby.  Do not use any products that contain nicotine or tobacco, such as cigarettes and e-cigarettes. If you need help quitting, ask your health care provider. You may receive counseling support and other resources to help you quit.  Schedule a dentist appointment. At home, brush your teeth with a soft toothbrush and be gentle when you floss. Contact a health care provider if:  You have dizziness.  You have mild pelvic cramps, pelvic pressure, or nagging pain in the abdominal area.  You have persistent nausea, vomiting, or diarrhea.  You have a bad smelling vaginal discharge.  You have pain when you urinate.  You notice increased swelling in your face, hands, legs, or ankles.  You are exposed to fifth disease or chickenpox.  You are exposed to [place] measles (rubella) and have never had it. Get help right away if:  You have a fever.  You are leaking fluid from your vagina.  You have spotting or bleeding from your vagina.  You have severe abdominal cramping or pain.  You have rapid weight gain or loss.  You vomit blood or material that looks like coffee grounds.  You develop a severe headache.  You have shortness of breath.  You have any kind of trauma, such as from a fall or a car accident. Summary  The first trimester of pregnancy is from week 1 until the end of week 13 (months 1 through 3).  Your body goes through many changes during pregnancy. The changes vary from  woman to woman.  You will have routine prenatal visits. During those visits, your health care provider will examine you, discuss any test results you may have, and talk with you about how you are feeling. This information is not intended to replace advice given to you by your health care provider. Make sure you discuss any questions you have with your health care provider. Document Released: 09/01/2001 Document Revised: 08/20/2017 Document Reviewed: 08/19/2016 Elsevier Patient Education  2020 [employer].  Steps to Quit Smoking Smoking tobacco is the leading cause of preventable death. It can affect almost every organ in the body. Smoking puts you and people around you at risk for many serious, long-lasting (chronic) diseases. Quitting smoking can be hard, but it is one of the best things that you can do for your health. It is never too late to quit. How do I get ready to quit? When you decide to quit smoking, make a plan to help you succeed. Before you quit:  Pick a date to quit. Set a date within the next 2 weeks to give you time to prepare.  Write down the reasons why you are quitting. Keep this list in places where you will see it often.  Tell your family, friends, and co-workers that you are quitting. Their support is important.  Talk with your doctor about the choices that may help you quit.  Find out if your health insurance will pay for these treatments.  Know the people, places, things, and activities that make you want to smoke (triggers). Avoid them. What first steps can I take to quit smoking?  Throw away all cigarettes at home, at work, and in your car.  Throw away the things that you use when you smoke, such as ashtrays and lighters.  Clean your car. Make sure to empty the ashtray.  Clean your home, including curtains and carpets. What can I do to help me quit smoking? Talk with your doctor about taking medicines and seeing a counselor at the same time. You are  more likely to succeed when you do both.  If you are pregnant or breastfeeding, talk with your doctor about counseling or other ways to quit smoking. Do not take medicine to help you quit smoking unless your doctor tells you to do so. To quit smoking: Quit right away  Quit smoking totally, instead of slowly cutting back on how much you smoke over a period of time.  Go to counseling. You are more likely to quit if you go to counseling sessions regularly. Take medicine You may take medicines to help you quit. Some medicines need a prescription, and some you can buy over-the-counter. Some medicines may contain a drug called nicotine to replace the nicotine in cigarettes. Medicines may:  Help you to stop having the desire to smoke (cravings).  Help to stop the problems that come when you stop smoking (withdrawal symptoms). Your doctor may ask you to use:  Nicotine patches, gum, or lozenges.  Nicotine inhalers or sprays.  Non-nicotine medicine that is taken by mouth. Find resources Find resources and other ways to help you quit smoking and remain smoke-free after you quit. These resources are most helpful when you use them often. They include:  Online chats with a Social worker.  Phone quitlines.  Printed Furniture conservator/restorer.  Support groups or group counseling.  Text messaging programs.  Mobile phone apps. Use apps on your mobile phone or tablet that can help you stick to your quit plan. There are many free apps for mobile phones and tablets as well as websites. Examples include Quit Guide from the [employer] and smokefree.gov  What things can I do to make it easier to quit?   Talk to your family and friends. Ask them to support and encourage you.  Call a phone quitline (1-800-QUIT-NOW), reach out to support groups, or work with a Social worker.  Ask people who smoke to not smoke around you.  Avoid places that make you want to smoke,  such as: ? Bars. ? Parties. ? Smoke-break areas at  work.  Spend time with people who do not smoke.  Lower the stress in your life. Stress can make you want to smoke. Try these things to help your stress: ? Getting regular exercise. ? Doing deep-breathing exercises. ? Doing yoga. ? Meditating. ? Doing a body scan. To do this, close your eyes, focus on one area of your body at a time from head to toe. Notice which parts of your body are tense. Try to relax the muscles in those areas. How will I feel when I quit smoking? Day 1 to 3 weeks Within the first 24 hours, you may start to have some problems that come from quitting tobacco. These problems are very bad 2-3 days after you quit, but they do not often last for more than 2-3 weeks. You may get these symptoms:  Mood swings.  Feeling restless, nervous, angry, or annoyed.  Trouble concentrating.  Dizziness.  Strong desire for high-sugar foods and nicotine.  Weight gain.  Trouble pooping (constipation).  Feeling like you may vomit (nausea).  Coughing or a sore throat.  Changes in how the medicines that you take for other issues work in your body.  Depression.  Trouble sleeping (insomnia). Week 3 and afterward After the first 2-3 weeks of quitting, you may start to notice more positive results, such as:  Better sense of smell and taste.  Less coughing and sore throat.  Slower heart rate.  Lower blood pressure.  Clearer skin.  Better breathing.  Fewer sick days. Quitting smoking can be hard. Do not give up if you fail the first time. Some people need to try a few times before they succeed. Do your best to stick to your quit plan, and talk with your doctor if you have any questions or concerns. Summary  Smoking tobacco is the leading cause of preventable death. Quitting smoking can be hard, but it is one of the best things that you can do for your health.  When you decide to quit smoking, make a plan to help you succeed.  Quit smoking right away, not slowly over a  period of time.  When you start quitting, seek help from your doctor, family, or friends. This information is not intended to replace advice given to you by your health care provider. Make sure you discuss any questions you have with your health care provider. Document Released: 07/04/2009 Document Revised: 11/25/2018 Document Reviewed: 11/26/2018 Elsevier Patient Education  2020 [employer].

## 2019-06-23 NOTE — Progress Notes (Signed)
Patient comes in today for pregnancy confirmation. LMP was 05/14/2019. EDD 5/302021. She was not trying to get pregnant, but was not taking BC.

## 2019-07-02 ENCOUNTER — Encounter: Payer: Self-pay | Admitting: Emergency Medicine

## 2019-07-02 ENCOUNTER — Emergency Department: Payer: Medicaid Other

## 2019-07-02 ENCOUNTER — Emergency Department
Admission: EM | Admit: 2019-07-02 | Discharge: 2019-07-02 | Disposition: A | Discharge status: Home / Self Care | Payer: Medicaid Other | Attending: Emergency Medicine | Admitting: Emergency Medicine

## 2019-07-02 ENCOUNTER — Other Ambulatory Visit: Payer: Self-pay

## 2019-07-02 DIAGNOSIS — F17200 Nicotine dependence, unspecified, uncomplicated: Secondary | ICD-10-CM | POA: Diagnosis not present

## 2019-07-02 DIAGNOSIS — O131 Gestational [pregnancy-induced] hypertension without significant proteinuria, first trimester: Secondary | ICD-10-CM | POA: Diagnosis not present

## 2019-07-02 DIAGNOSIS — O200 Threatened abortion: Secondary | ICD-10-CM

## 2019-07-02 DIAGNOSIS — O99331 Smoking (tobacco) complicating pregnancy, first trimester: Secondary | ICD-10-CM | POA: Diagnosis not present

## 2019-07-02 DIAGNOSIS — Z3A01 Less than 8 weeks gestation of pregnancy: Secondary | ICD-10-CM | POA: Insufficient documentation

## 2019-07-02 DIAGNOSIS — Z79899 Other long term (current) drug therapy: Secondary | ICD-10-CM | POA: Insufficient documentation

## 2019-07-02 DIAGNOSIS — O4690 Antepartum hemorrhage, unspecified, unspecified trimester: Secondary | ICD-10-CM

## 2019-07-02 DIAGNOSIS — O209 Hemorrhage in early pregnancy, unspecified: Secondary | ICD-10-CM | POA: Diagnosis present

## 2019-07-02 LAB — CBC WITH DIFFERENTIAL/PLATELET
Abs Immature Granulocytes: 0.02 10*3/uL (ref 0.00–0.07)
Basophils Absolute: 0 10*3/uL (ref 0.0–0.1)
Basophils Relative: 0 %
Eosinophils Absolute: 0.2 10*3/uL (ref 0.0–0.5)
Eosinophils Relative: 3 %
HCT: 38.5 % (ref 36.0–46.0)
Hemoglobin: 12.6 g/dL (ref 12.0–15.0)
Immature Granulocytes: 0 %
Lymphocytes Relative: 29 %
Lymphs Abs: 1.9 10*3/uL (ref 0.7–4.0)
MCH: 30.2 pg (ref 26.0–34.0)
MCHC: 32.7 g/dL (ref 30.0–36.0)
MCV: 92.3 fL (ref 80.0–100.0)
Monocytes Absolute: 0.6 10*3/uL (ref 0.1–1.0)
Monocytes Relative: 9 %
Neutro Abs: 3.8 10*3/uL (ref 1.7–7.7)
Neutrophils Relative %: 59 %
Platelets: 228 10*3/uL (ref 150–400)
RBC: 4.17 MIL/uL (ref 3.87–5.11)
RDW: 12.5 % (ref 11.5–15.5)
WBC: 6.5 10*3/uL (ref 4.0–10.5)
nRBC: 0 % (ref 0.0–0.2)

## 2019-07-02 LAB — BASIC METABOLIC PANEL
Anion gap: 7 (ref 5–15)
BUN: 7 mg/dL (ref 6–20)
CO2: 22 mmol/L (ref 22–32)
Calcium: 9.1 mg/dL (ref 8.9–10.3)
Chloride: 106 mmol/L (ref 98–111)
Creatinine, Ser: 0.5 mg/dL (ref 0.44–1.00)
GFR calc Af Amer: >60 mL/min (ref >60)
GFR calc non Af Amer: >60 mL/min (ref >60)
Glucose, Bld: 118 mg/dL — ABNORMAL HIGH (ref 70–99)
Potassium: 3.8 mmol/L (ref 3.5–5.1)
Sodium: 135 mmol/L (ref 135–145)

## 2019-07-02 LAB — ABO/RH: ABO/RH(D): O POS

## 2019-07-02 LAB — WET PREP, GENITAL
Clue Cells Wet Prep HPF POC: NONE SEEN
Sperm: NONE SEEN
Trich, Wet Prep: NONE SEEN
Yeast Wet Prep HPF POC: NONE SEEN

## 2019-07-02 LAB — HCG, QUANTITATIVE, PREGNANCY: hCG, Beta Chain, Quant, S: 8645 m[IU]/mL — ABNORMAL HIGH (ref <5)

## 2019-07-02 NOTE — ED Notes (Signed)
Pt ambulatory to the bathroom at this time, pt ambulatory back to bed without difficulty. NAD noted at this time. Will continue to monitor for further patient needs.

## 2019-07-02 NOTE — ED Notes (Signed)
Pt repeatedly asks, "Why is it taking so long. Why am I still here. Where is the doctor." Pt advised that the doctors have shift change to give them a few minutes. Will continue to monitor.

## 2019-07-02 NOTE — ED Notes (Signed)
Pt requesting to know if she has to wait for results of wet prep, this RN explained per Dr. Jasmine Awe prefer if she waited. Pt appears agitated, states her lips are dry and she is hungry, offered patient PB and crackers if MD okay with it or pop sickles, pt refused, pt shaking her head and playing on her phone at this time, Repeat VS obtained, VSS at this time. Will continue to monitor.

## 2019-07-02 NOTE — ED Notes (Addendum)
Pt noticed heavy bleeding around 11am. Pt states she stood up and felt of rush of blood down her leg. Pt denies bleeding currently. Pt states she still has the same pad on that she did this morning. Pt states the pad is currently light pink. Pt states she did notice a small amount of blood when she used the bathroom twice while waiting. Pt denies abdominal. Pt states she is ready to get out of here. Will continue to monitor.

## 2019-07-02 NOTE — ED Notes (Signed)
Pt moved to room 2 in order for Dr. Charna Archer to perform pelvic exam.

## 2019-07-02 NOTE — ED Triage Notes (Signed)
States she is around [redacted] weeks pregnant.  C/O vaginal bleeding x 30 minutes.  AAOx3.  Skin warm and dry. NAD.

## 2019-07-02 NOTE — ED Provider Notes (Signed)
[facility] Provider Note   ____________________________________________   First MD Initiated Contact with Patient 07/02/19 1510     (approximate)  I have reviewed the triage vital signs and the nursing notes.   HISTORY  Chief Complaint Vaginal Bleeding    HPI Vanessa Franco is a 38 y.o. female, G3 P1-0-1-1 at approximately 7 weeks of pregnancy presents to the ED complaining of vaginal bleeding.  Patient reports that around 11:00 this morning she had felt a rush of blood down her leg.  Bleeding initially seemed to stop afterwards, however she reports some additional bleeding after she just went to the bathroom.  She has not had any abdominal or pelvic pain.  She reports having a prior ultrasound this pregnancy that was unremarkable.  She denies any fevers, chills, vaginal discharge, dysuria, or hematuria.  [facility].        Past Medical History:  Diagnosis Date  . Anxiety   . Depression   . Fibroid   . History of D&C   . History of uterine fibroid 11/2013   [facility]    Patient Active Problem List   Diagnosis Date Noted  . Pre-existing essential hypertension during pregnancy in first trimester 06/23/2019  . Anxiety 06/23/2019  . Tobacco abuse 02/04/2016  . Multigravida of advanced maternal age in first trimester 02/04/2016    Past Surgical History:  Procedure Laterality Date  . DILATION AND CURETTAGE OF UTERUS    . MYOMECTOMY  2015   Hysteroscopic    Prior to Admission medications   Medication Sig Start Date End Date Taking? Authorizing Provider  labetalol (NORMODYNE) 200 MG tablet Take 1 tablet (200 mg total) by mouth 2 (two) times daily. 06/23/19   [name], MD  metroNIDAZOLE (METROGEL) 0.75 % vaginal gel Place 1 Applicatorful vaginally at bedtime. Apply one applicatorful to vagina at bedtime for 5 days Patient not taking: Reported on 06/23/2019 05/10/18   [name], MD   Multiple Vitamins-Minerals (HAIR SKIN AND NAILS FORMULA PO) Take by mouth.    [provider]  Prenat-FeFum-FePo-FA-DHA w/o A (PROVIDA DHA) 16-16-1.25-110 MG CAPS Take 1 tablet by mouth daily. 06/23/19   [name], MD    Allergies Ketorolac, Toradol [ketorolac tromethamine], and Sulfa antibiotics  Family History  Problem Relation Age of Onset  . Diabetes Father   . Kidney failure Father   . Heart failure Father   . Rheum arthritis Mother   . Hypertension Mother     Social History Social History   Tobacco Use  . Smoking status: Current Some Day Smoker    Packs/day: 0.25  . Smokeless tobacco: Never Used  Substance Use Topics  . Alcohol use: Yes    Alcohol/week: 0.0 standard drinks    Comment: occass  . Drug use: Not Currently    Types: Marijuana    Review of Systems  Constitutional: No fever/chills Eyes: No visual changes. ENT: No sore throat. Cardiovascular: Denies chest pain. Respiratory: Denies shortness of breath. Gastrointestinal: No abdominal pain.  No nausea, no vomiting.  No diarrhea.  No constipation. Genitourinary: Negative for dysuria.  Positive for vaginal bleeding. Musculoskeletal: Negative for back pain. Skin: Negative for rash. Neurological: Negative for headaches, focal weakness or numbness.  ____________________________________________   PHYSICAL EXAM:  VITAL SIGNS: ED Triage Vitals  Enc Vitals Group     BP 07/02/19 1155 (!) 155/81     Pulse Rate 07/02/19 1155 88     Resp 07/02/19  1155 18     Temp 07/02/19 1155 99.2 F (37.3 C)     Temp Source 07/02/19 1155 Oral     SpO2 07/02/19 1155 99 %     Weight 07/02/19 1150 192 lb 0.3 oz (87.1 kg)     Height 07/02/19 1155 5\' 7"  (1.702 m)     Head Circumference --      Peak Flow --      Pain Score 07/02/19 1150 0     Pain Loc --      Pain Edu? --      Excl. in [place]? --     Constitutional: Alert and oriented. Eyes: Conjunctivae are normal. Head: Atraumatic. Nose: No  congestion/rhinnorhea. Mouth/Throat: Mucous membranes are moist. Neck: Normal ROM Cardiovascular: Normal rate, regular rhythm. Grossly normal heart sounds. Respiratory: Normal respiratory effort.  No retractions. Lungs CTAB. Gastrointestinal: Soft and nontender. No distention. Genitourinary: Close cervical os, small amount of clot with no active bleeding. No cervical motion or adnexal tenderness. Musculoskeletal: No lower extremity tenderness nor edema. Neurologic:  Normal speech and language. No gross focal neurologic deficits are appreciated. Skin:  Skin is warm, dry and intact. No rash noted. Psychiatric: Mood and affect are normal. Speech and behavior are normal.  ____________________________________________   LABS (all labs ordered are listed, but only abnormal results are displayed)  Labs Reviewed  WET PREP, GENITAL - Abnormal; Notable for the following components:      Result Value   WBC, Wet Prep HPF POC RARE (*)    All other components within normal limits  BASIC METABOLIC PANEL - Abnormal; Notable for the following components:   Glucose, Bld 118 (*)    All other components within normal limits  HCG, QUANTITATIVE, PREGNANCY - Abnormal; Notable for the following components:   hCG, Beta Chain, Quant, S 8,645 (*)    All other components within normal limits  CBC WITH DIFFERENTIAL/PLATELET  ABO/RH     PROCEDURES  Procedure(s) performed (including Critical Care):  Procedures   ____________________________________________   INITIAL IMPRESSION / ASSESSMENT AND PLAN / ED COURSE       38 year old female at approximately 7 weeks of pregnancy presents to the ED complaining of vaginal bleeding.  Ultrasound shows intrauterine pregnancy with low normal fetal heart tones.  Advised patient of this and that she is at higher risk for miscarriage this pregnancy.  Will perform pelvic exam to assess cervical os and degree of bleeding.  She is Rh+, no indication for RhoGam.  Labs  are unremarkable, H&H stable.  Pelvic exam shows closed cervical os with small amount of clot in the vaginal vault and no ongoing bleeding.  Prep is unremarkable.  Counseled patient to follow-up with OB/GYN this week and return to the ED for new or worsening symptoms.  Patient agrees with plan.      ____________________________________________   FINAL CLINICAL IMPRESSION(S) / ED DIAGNOSES  Final diagnoses:  Threatened miscarriage     ED Discharge Orders    None       Note:  This document was prepared using Dragon voice recognition software and may include unintentional dictation errors.   Blake Divine, MD 07/02/19 1701

## 2019-07-04 ENCOUNTER — Telehealth: Payer: Self-pay

## 2019-07-04 ENCOUNTER — Other Ambulatory Visit: Payer: Medicaid Other

## 2019-07-04 ENCOUNTER — Other Ambulatory Visit: Payer: Self-pay | Admitting: Obstetrics and Gynecology

## 2019-07-04 DIAGNOSIS — R58 Hemorrhage, not elsewhere classified: Secondary | ICD-10-CM

## 2019-07-04 NOTE — Telephone Encounter (Signed)
Spoke with pt and informed her of the information [facility] provided that her baby's heart rate was normal for the weeks she was pregnant and it was okay for her to wait until Friday for her appointment. Pt stated that she understood.

## 2019-07-04 NOTE — Telephone Encounter (Signed)
Spoke with pt and informed her that her visit on Friday is with a nurse and not with a provider. Pt was informed that I would make [facility] aware of the pt's concerns. Pt was informed that I would call her back after speaking with AC. Pt stated that she understood.

## 2019-07-07 ENCOUNTER — Ambulatory Visit (INDEPENDENT_AMBULATORY_CARE_PROVIDER_SITE_OTHER): Payer: Medicaid Other | Admitting: Obstetrics and Gynecology

## 2019-07-07 ENCOUNTER — Ambulatory Visit (INDEPENDENT_AMBULATORY_CARE_PROVIDER_SITE_OTHER): Payer: Medicaid Other

## 2019-07-07 ENCOUNTER — Other Ambulatory Visit: Payer: Self-pay

## 2019-07-07 VITALS — BP 118/76 | HR 89 | Ht 67.0 in | Wt 193.6 lb

## 2019-07-07 DIAGNOSIS — O09521 Supervision of elderly multigravida, first trimester: Secondary | ICD-10-CM

## 2019-07-07 DIAGNOSIS — Z3A01 Less than 8 weeks gestation of pregnancy: Secondary | ICD-10-CM

## 2019-07-07 DIAGNOSIS — O3481 Maternal care for other abnormalities of pelvic organs, first trimester: Secondary | ICD-10-CM | POA: Diagnosis not present

## 2019-07-07 DIAGNOSIS — N8312 Corpus luteum cyst of left ovary: Secondary | ICD-10-CM

## 2019-07-07 DIAGNOSIS — R58 Hemorrhage, not elsewhere classified: Secondary | ICD-10-CM

## 2019-07-07 NOTE — Progress Notes (Signed)
Vanessa Franco presents for NOB nurse interview visit. Pregnancy confirmation done 06/23/2019. G4. [id number]. Pregnancy education material explained and given. 0 cats in home. NOB labs ordered. TSH/HbgA1c ordered due to BMI 30 or greater. Sickle cell ordered due to patient's race. HIV labs and drug screen were explained and ordered. PNV encouraged. Genetic screening options discussed. Genetic testing: Unsure. Patient may discuss with the provider. Patient to follow up with provider in _4-5 ___ weeks for NOB physical. All questions answered.  Per patient [place] said that her EDD is 02/25/2020. That puts her at [redacted]w[redacted]d. Unable to see [place] results at nurse intake visit.

## 2019-07-08 LAB — URINALYSIS, ROUTINE W REFLEX MICROSCOPIC
Bilirubin, UA: NEGATIVE
Glucose, UA: NEGATIVE
Ketones, UA: NEGATIVE
Leukocytes,UA: NEGATIVE
Nitrite, UA: NEGATIVE
Protein,UA: NEGATIVE
RBC, UA: NEGATIVE
Specific Gravity, UA: 1.02 (ref 1.005–1.030)
Urobilinogen, Ur: 0.2 mg/dL (ref 0.2–1.0)
pH, UA: 5.5 (ref 5.0–7.5)

## 2019-07-09 LAB — ABO AND RH: Rh Factor: POSITIVE

## 2019-07-09 LAB — HEPATITIS B SURFACE ANTIGEN: Hepatitis B Surface Ag: NEGATIVE

## 2019-07-09 LAB — RPR: RPR Ser Ql: NONREACTIVE

## 2019-07-09 LAB — HIV ANTIBODY (ROUTINE TESTING W REFLEX): HIV Screen 4th Generation wRfx: NONREACTIVE

## 2019-07-09 LAB — VARICELLA ZOSTER ANTIBODY, IGG: Varicella zoster IgG: 2835 index (ref >165)

## 2019-07-09 LAB — RUBELLA SCREEN: Rubella Antibodies, IGG: 1.68 index (ref >0.99)

## 2019-07-09 LAB — GC/CHLAMYDIA PROBE AMP
Chlamydia trachomatis, NAA: NEGATIVE
Neisseria Gonorrhoeae by PCR: NEGATIVE

## 2019-07-09 LAB — CULTURE, OB URINE

## 2019-07-09 LAB — HGB SOLU + RFLX FRAC: Sickle Solubility Test - HGBRFX: NEGATIVE

## 2019-07-09 LAB — ANTIBODY SCREEN: Antibody Screen: NEGATIVE

## 2019-07-09 LAB — URINE CULTURE, OB REFLEX

## 2019-07-13 LAB — MONITOR DRUG PROFILE 14(MW)
Amphetamine Scrn, Ur: NEGATIVE ng/mL
BARBITURATE SCREEN URINE: NEGATIVE ng/mL
BENZODIAZEPINE SCREEN, URINE: NEGATIVE ng/mL
Buprenorphine, Urine: NEGATIVE ng/mL
Cocaine (Metab) Scrn, Ur: NEGATIVE ng/mL
Creatinine(Crt), U: 124.1 mg/dL (ref 20.0–300.0)
Fentanyl, Urine: NEGATIVE pg/mL
Meperidine Screen, Urine: NEGATIVE ng/mL
Methadone Screen, Urine: NEGATIVE ng/mL
OXYCODONE+OXYMORPHONE UR QL SCN: NEGATIVE ng/mL
Opiate Scrn, Ur: NEGATIVE ng/mL
Ph of Urine: 5.6 (ref 4.5–8.9)
Phencyclidine Qn, Ur: NEGATIVE ng/mL
Propoxyphene Scrn, Ur: NEGATIVE ng/mL
SPECIFIC GRAVITY: 1.023
Tramadol Screen, Urine: NEGATIVE ng/mL

## 2019-07-13 LAB — CANNABINOID (GC/MS), URINE
Cannabinoid: POSITIVE — AB
Carboxy THC (GC/MS): >300 ng/mL

## 2019-07-13 LAB — NICOTINE SCREEN, URINE: Cotinine Ql Scrn, Ur: POSITIVE ng/mL — AB

## 2019-07-27 MED ORDER — NICOTINE 14 MG/24HR TD PT24: 14.0000 mg | MEDICATED_PATCH | Freq: Every day | TRANSDERMAL | 1 refills | Status: DC

## 2019-08-08 ENCOUNTER — Encounter: Payer: Self-pay | Admitting: Obstetrics and Gynecology

## 2019-08-08 ENCOUNTER — Other Ambulatory Visit: Payer: Self-pay

## 2019-08-08 ENCOUNTER — Ambulatory Visit (INDEPENDENT_AMBULATORY_CARE_PROVIDER_SITE_OTHER): Payer: Medicaid Other | Admitting: Obstetrics and Gynecology

## 2019-08-08 VITALS — BP 119/76 | HR 86 | Ht 67.0 in | Wt 190.9 lb

## 2019-08-08 DIAGNOSIS — E663 Overweight: Secondary | ICD-10-CM

## 2019-08-08 DIAGNOSIS — F1290 Cannabis use, unspecified, uncomplicated: Secondary | ICD-10-CM

## 2019-08-08 DIAGNOSIS — O09521 Supervision of elderly multigravida, first trimester: Secondary | ICD-10-CM | POA: Diagnosis not present

## 2019-08-08 DIAGNOSIS — O10011 Pre-existing essential hypertension complicating pregnancy, first trimester: Secondary | ICD-10-CM

## 2019-08-08 DIAGNOSIS — Z720 Tobacco use: Secondary | ICD-10-CM

## 2019-08-08 LAB — POCT URINALYSIS DIPSTICK OB
Bilirubin, UA: NEGATIVE
Blood, UA: NEGATIVE
Glucose, UA: NEGATIVE
Ketones, UA: NEGATIVE
Leukocytes, UA: NEGATIVE
Nitrite, UA: NEGATIVE
POC,PROTEIN,UA: NEGATIVE
Spec Grav, UA: >=1.03 — AB (ref 1.010–1.025)
Urobilinogen, UA: 0.2 E.U./dL
pH, UA: 6 (ref 5.0–8.0)

## 2019-08-08 NOTE — Progress Notes (Signed)
ROB-Pt present for NOB PE. Pt stated having pain and pressure in the lower abd when she urinate. Pt stated that she was doing well no other problems.

## 2019-08-08 NOTE — Progress Notes (Signed)
OBSTETRIC INITIAL PRENATAL VISIT  Subjective:    Vanessa Franco is being seen today for her first obstetrical visit.  This is not a planned pregnancy. She is a 38 y.o. [id number] female at [redacted]w[redacted]d gestation, Estimated Date of Delivery: 02/25/20 with Patient's last menstrual period was 05/14/2019. consistent with 6 week sono. Her obstetrical history is significant for advanced maternal age, smoker and chronic HTN (started on Labetalol at [facility] confirmation). Relationship with FOB: significant other, living together. Patient does not intend to breast feed but may consider pumping. Pregnancy history fully reviewed.    OB History  Gravida Para Term Preterm AB Living  4 1 1  0 2 1  SAB TAB Ectopic Multiple Live Births  2 0 0 0 1    # Outcome Date GA Lbr Len/2nd Weight Sex Delivery Anes PTL Lv  4 Current           3 Term 08/16/16 [redacted]w[redacted]d 09:15 / 01:20 5 lb 15.9 oz (2.72 kg) F Vag-Spont EPI  LIV     Name: SONIE, OMEROVIC     Apgar1: 8  Apgar5: 9  2 SAB 2002        FD  1 SAB 2001        FD    Gynecologic History:  Last pap smear was 03/16/2017.  Results were normal.  Denieswh/o abnormal pap smears in the past.  Denies history of STIs.    Past Medical History:  Diagnosis Date  . Anxiety   . Depression   . Fibroid   . History of D&C   . History of uterine fibroid 11/2013   [facility]    Family History  Problem Relation Age of Onset  . Diabetes Father   . Kidney failure Father   . Heart failure Father   . Rheum arthritis Mother   . Hypertension Mother     Past Surgical History:  Procedure Laterality Date  . DILATION AND CURETTAGE OF UTERUS    . MYOMECTOMY  2015   Hysteroscopic    Social History   Socioeconomic History  . Marital status: Single    Spouse name: Not on file  . Number of children: Not on file  . Years of education: Not on file  . Highest education level: Not on file  Occupational History  . Occupation: Scientist, clinical (histocompatibility and immunogenetics): [employer]   . Financial resource strain: Not on file  . Food insecurity    Worry: Not on file    Inability: Not on file  . Transportation needs    Medical: Not on file    Non-medical: Not on file  Tobacco Use  . Smoking status: Current Some Day Smoker    Packs/day: 0.25  . Smokeless tobacco: Never Used  Substance and Sexual Activity  . Alcohol use: Not Currently    Alcohol/week: 0.0 standard drinks    Comment: occass  . Drug use: Not Currently    Types: Marijuana  . Sexual activity: Yes    Partners: Male    Birth control/protection: None  Lifestyle  . Physical activity    Days per week: Not on file    Minutes per session: Not on file  . Stress: Not on file  Relationships  . Social Herbalist on phone: Not on file    Gets together: Not on file    Attends religious service: Not on file    Active member of club or organization: Not on file  Attends meetings of clubs or organizations: Not on file    Relationship status: Not on file  . Intimate partner violence    Fear of current or ex partner: Not on file    Emotionally abused: Not on file    Physically abused: Not on file    Forced sexual activity: Not on file  Other Topics Concern  . Not on file  Social History Narrative  . Not on file    Current Outpatient Medications on File Prior to Visit  Medication Sig Dispense Refill  . acetaminophen (TYLENOL) 500 MG tablet Take 500 mg by mouth every 6 (six) hours as needed.    . labetalol (NORMODYNE) 200 MG tablet Take 1 tablet (200 mg total) by mouth 2 (two) times daily. 60 tablet 11  . Multiple Vitamins-Minerals (HAIR SKIN AND NAILS FORMULA PO) Take by mouth.    . nicotine (NICODERM CQ - DOSED IN MG/24 HOURS) 14 mg/24hr patch Place 1 patch (14 mg total) onto the skin daily. 28 patch 1  . Prenat-FeFum-FePo-FA-DHA w/o A (PROVIDA DHA) 16-16-1.25-110 MG CAPS Take 1 tablet by mouth daily. 30 capsule 11   No current facility-administered medications on file prior to visit.      Allergies  Allergen Reactions  . Ketorolac Other (See Comments)    Swelling, water retention  . Toradol [Ketorolac Tromethamine] Swelling  . Sulfa Antibiotics Rash     Review of Systems General: Not Present- Fever, Weight Loss and Weight Gain. Skin: Not Present- Rash. HEENT: Not Present- Blurred Vision, Headache and Bleeding Gums. Respiratory: Not Present- Difficulty Breathing. Breast: Not Present- Breast Mass. Cardiovascular: Not Present- Chest Pain, Elevated Blood Pressure, Fainting / Blacking Out and Shortness of Breath. Gastrointestinal: Not Present- Abdominal Pain, Constipation, Nausea and Vomiting. Female Genitourinary: Not Present- Frequency, Painful Urination, Pelvic Pain, Vaginal Bleeding, Vaginal Discharge, Contractions, regular, Fetal Movements Decreased, Urinary Complaints and Vaginal Fluid. Musculoskeletal: Not Present- Back Pain and Leg Cramps. Positive for left leg pain (sharp shooting, from knee to ankle).  Neurological: Not Present- Dizziness. Psychiatric: Not Present- Depression.     Objective:   Blood pressure 119/76, pulse 86, weight 190 lb 14.4 oz (86.6 kg), last menstrual period 05/14/2019.  Body mass index is 29.9 kg/m.  General Appearance:    Alert, cooperative, no distress, appears stated age, overweight.   Head:    Normocephalic, without obvious abnormality, atraumatic  Eyes:    PERRL, conjunctiva/corneas clear, EOM's intact, both eyes  Ears:    Normal external ear canals, both ears  Nose:   Nares normal, septum midline, mucosa normal, no drainage or sinus tenderness  Throat:   Lips, mucosa, and tongue normal; teeth and gums normal  Neck:   Supple, symmetrical, trachea midline, no adenopathy; thyroid: no enlargement/tenderness/nodules; no carotid bruit or JVD  Back:     Symmetric, no curvature, ROM normal, no CVA tenderness  Lungs:     Clear to auscultation bilaterally, respirations unlabored  Chest Wall:    No tenderness or deformity   Heart:     Regular rate and rhythm, S1 and S2 normal, no murmur, rub or gallop  Breast Exam:    No tenderness, masses, or nipple abnormality  Abdomen:     Soft, non-tender, bowel sounds active all four quadrants, no masses, no organomegaly.  FHT 160  bpm.  Genitalia:    Pelvic:external genitalia normal, vagina without lesions, discharge, or tenderness, rectovaginal septum  normal. Cervix normal in appearance, no cervical motion tenderness, no adnexal masses or tenderness.  Pregnancy positive findings: uterine enlargement: 11 wk size, nontender.   Rectal:    Normal external sphincter.  No hemorrhoids appreciated. Internal exam not done.   Extremities:   Extremities normal, atraumatic, no cyanosis or edema  Pulses:   2+ and symmetric all extremities  Skin:   Skin color, texture, turgor normal, no rashes or lesions  Lymph nodes:   Cervical, supraclavicular, and axillary nodes normal  Neurologic:   CNII-XII intact, normal strength, sensation and reflexes throughout      Assessment:    Pregnancy at 11 and 2/7 weeks   Chronic HTN   Substance use (tobacco and marijuana).  Advanced maternal age  Plan:   Initial labs reviewed. Baseline PIH labs ordered.  Prenatal vitamins encouraged.  Problem list reviewed and updated. New OB counseling:  The patient has been given an overview regarding routine prenatal care.  Recommendations regarding diet, weight gain, and exercise in pregnancy were given. Prenatal testing, optional genetic testing, and ultrasound use in pregnancy were reviewed.  Desires cell-free DNA testing with [facility]. Will need AFP next visit.  Benefits of Breast Feeding were discussed. The patient is encouraged to consider nursing her baby post partum. Counseled on smoking cessation (tobacco and marijuana). Notes cigarette smoking has decreased down to 3-4 cig/day.  Follow up in 4 weeks. Down to 3-4 cigarettes per day. Discussed cessation of both tobacco and marijuana.    The patient has  Medicaid.  [facility] Medicaid Risk Screening Form completed today  50% of 30 min visit spent on counseling and coordination of care.     [name], MD [facility]

## 2019-08-08 NOTE — Patient Instructions (Signed)
First Trimester of Pregnancy The first trimester of pregnancy is from week 1 until the end of week 13 (months 1 through 3). A week after a sperm fertilizes an egg, the egg will implant on the wall of the uterus. This embryo will begin to develop into a baby. Genes from you and your partner will form the baby. The female genes will determine whether the baby will be a boy or a girl. At 6-8 weeks, the eyes and face will be formed, and the heartbeat can be seen on ultrasound. At the end of 12 weeks, all the baby's organs will be formed. Now that you are pregnant, you will want to do everything you can to have a healthy baby. Two of the most important things are to get good prenatal care and to follow your health care provider's instructions. Prenatal care is all the medical care you receive before the baby's birth. This care will help prevent, find, and treat any problems during the pregnancy and childbirth. Body changes during your first trimester Your body goes through many changes during pregnancy. The changes vary from woman to woman.  You may gain or lose a couple of pounds at first.  You may feel sick to your stomach (nauseous) and you may throw up (vomit). If the vomiting is uncontrollable, call your health care provider.  You may tire easily.  You may develop headaches that can be relieved by medicines. All medicines should be approved by your health care provider.  You may urinate more often. Painful urination may mean you have a bladder infection.  You may develop heartburn as a result of your pregnancy.  You may develop constipation because certain hormones are causing the muscles that push stool through your intestines to slow down.  You may develop hemorrhoids or swollen veins (varicose veins).  Your breasts may begin to grow larger and become tender. Your nipples may stick out more, and the tissue that surrounds them (areola) may become darker.  Your gums may bleed and may be  sensitive to brushing and flossing.  Dark spots or blotches (chloasma, mask of pregnancy) may develop on your face. This will likely fade after the baby is born.  Your menstrual periods will stop.  You may have a loss of appetite.  You may develop cravings for certain kinds of food.  You may have changes in your emotions from day to day, such as being excited to be pregnant or being concerned that something may go wrong with the pregnancy and baby.  You may have more vivid and strange dreams.  You may have changes in your hair. These can include thickening of your hair, rapid growth, and changes in texture. Some women also have hair loss during or after pregnancy, or hair that feels dry or thin. Your hair will most likely return to normal after your baby is born. What to expect at prenatal visits During a routine prenatal visit:  You will be weighed to make sure you and the baby are growing normally.  Your blood pressure will be taken.  Your abdomen will be measured to track your baby's growth.  The fetal heartbeat will be listened to between weeks 10 and 14 of your pregnancy.  Test results from any previous visits will be discussed. Your health care provider may ask you:  How you are feeling.  If you are feeling the baby move.  If you have had any abnormal symptoms, such as leaking fluid, bleeding, severe headaches, or abdominal   cramping.  If you are using any tobacco products, including cigarettes, chewing tobacco, and electronic cigarettes.  If you have any questions. Other tests that may be performed during your first trimester include:  Blood tests to find your blood type and to check for the presence of any previous infections. The tests will also be used to check for low iron levels (anemia) and protein on red blood cells (Rh antibodies). Depending on your risk factors, or if you previously had diabetes during pregnancy, you may have tests to check for high blood sugar  that affects pregnant women (gestational diabetes).  Urine tests to check for infections, diabetes, or protein in the urine.  An ultrasound to confirm the proper growth and development of the baby.  Fetal screens for spinal cord problems (spina bifida) and Down syndrome.  HIV (human immunodeficiency virus) testing. Routine prenatal testing includes screening for HIV, unless you choose not to have this test.  You may need other tests to make sure you and the baby are doing well. Follow these instructions at home: Medicines  Follow your health care provider's instructions regarding medicine use. Specific medicines may be either safe or unsafe to take during pregnancy.  Take a prenatal vitamin that contains at least 600 micrograms (mcg) of folic acid.  If you develop constipation, try taking a stool softener if your health care provider approves. Eating and drinking   Eat a balanced diet that includes fresh fruits and vegetables, whole grains, good sources of protein such as meat, eggs, or tofu, and low-fat dairy. Your health care provider will help you determine the amount of weight gain that is right for you.  Avoid raw meat and uncooked cheese. These carry germs that can cause birth defects in the baby.  Eating four or five small meals rather than three large meals a day may help relieve nausea and vomiting. If you start to feel nauseous, eating a few soda crackers can be helpful. Drinking liquids between meals, instead of during meals, also seems to help ease nausea and vomiting.  Limit foods that are high in fat and processed sugars, such as fried and sweet foods.  To prevent constipation: ? Eat foods that are high in fiber, such as fresh fruits and vegetables, whole grains, and beans. ? Drink enough fluid to keep your urine clear or pale yellow. Activity  Exercise only as directed by your health care provider. Most women can continue their usual exercise routine during  pregnancy. Try to exercise for 30 minutes at least 5 days a week. Exercising will help you: ? Control your weight. ? Stay in shape. ? Be prepared for labor and delivery.  Experiencing pain or cramping in the lower abdomen or lower back is a good sign that you should stop exercising. Check with your health care provider before continuing with normal exercises.  Try to avoid standing for long periods of time. Move your legs often if you must stand in one place for a long time.  Avoid heavy lifting.  Wear low-heeled shoes and practice good posture.  You may continue to have sex unless your health care provider tells you not to. Relieving pain and discomfort  Wear a good support bra to relieve breast tenderness.  Take warm sitz baths to soothe any pain or discomfort caused by hemorrhoids. Use hemorrhoid cream if your health care provider approves.  Rest with your legs elevated if you have leg cramps or low back pain.  If you develop varicose veins in   your legs, wear support hose. Elevate your feet for 15 minutes, 3-4 times a day. Limit salt in your diet. Prenatal care  Schedule your prenatal visits by the twelfth week of pregnancy. They are usually scheduled monthly at first, then more often in the last 2 months before delivery.  Write down your questions. Take them to your prenatal visits.  Keep all your prenatal visits as told by your health care provider. This is important. Safety  Wear your seat belt at all times when driving.  Make a list of emergency phone numbers, including numbers for family, friends, the hospital, and police and fire departments. General instructions  Ask your health care provider for a referral to a local prenatal education class. Begin classes no later than the beginning of month 6 of your pregnancy.  Ask for help if you have counseling or nutritional needs during pregnancy. Your health care provider can offer advice or refer you to specialists for help  with various needs.  Do not use hot tubs, steam rooms, or saunas.  Do not douche or use tampons or scented sanitary pads.  Do not cross your legs for long periods of time.  Avoid cat litter boxes and soil used by cats. These carry germs that can cause birth defects in the baby and possibly loss of the fetus by miscarriage or stillbirth.  Avoid all smoking, herbs, alcohol, and medicines not prescribed by your health care provider. Chemicals in these products affect the formation and growth of the baby.  Do not use any products that contain nicotine or tobacco, such as cigarettes and e-cigarettes. If you need help quitting, ask your health care provider. You may receive counseling support and other resources to help you quit.  Schedule a dentist appointment. At home, brush your teeth with a soft toothbrush and be gentle when you floss. Contact a health care provider if:  You have dizziness.  You have mild pelvic cramps, pelvic pressure, or nagging pain in the abdominal area.  You have persistent nausea, vomiting, or diarrhea.  You have a bad smelling vaginal discharge.  You have pain when you urinate.  You notice increased swelling in your face, hands, legs, or ankles.  You are exposed to fifth disease or chickenpox.  You are exposed to [place] measles (rubella) and have never had it. Get help right away if:  You have a fever.  You are leaking fluid from your vagina.  You have spotting or bleeding from your vagina.  You have severe abdominal cramping or pain.  You have rapid weight gain or loss.  You vomit blood or material that looks like coffee grounds.  You develop a severe headache.  You have shortness of breath.  You have any kind of trauma, such as from a fall or a car accident. Summary  The first trimester of pregnancy is from week 1 until the end of week 13 (months 1 through 3).  Your body goes through many changes during pregnancy. The changes vary from  woman to woman.  You will have routine prenatal visits. During those visits, your health care provider will examine you, discuss any test results you may have, and talk with you about how you are feeling. This information is not intended to replace advice given to you by your health care provider. Make sure you discuss any questions you have with your health care provider. Document Released: 09/01/2001 Document Revised: 08/20/2017 Document Reviewed: 08/19/2016 Elsevier Patient Education  2020 [employer].  Hypertension During Pregnancy Hypertension is also called high blood pressure. High blood pressure means that the force of your blood moving in your body is too strong. It can cause problems for you and your baby. Different types of high blood pressure can happen during pregnancy. The types are:  High blood pressure before you got pregnant. This is called chronic hypertension.  This can continue during your pregnancy. Your doctor will want to keep checking your blood pressure. You may need medicine to keep your blood pressure under control while you are pregnant. You will need follow-up visits after you have your baby.  High blood pressure that goes up during pregnancy when it was normal before. This is called gestational hypertension. It will usually get better after you have your baby, but your doctor will need to watch your blood pressure to make sure that it is getting better.  Very high blood pressure during pregnancy. This is called preeclampsia. Very high blood pressure is an emergency that needs to be checked and treated right away.  You may develop very high blood pressure after giving birth. This is called postpartum preeclampsia. This usually occurs within 48 hours after childbirth but may occur up to 6 weeks after giving birth. This is rare. How does this affect me? If you have high blood pressure during pregnancy, you have a higher chance of developing high blood pressure:   As you get older.  If you get pregnant again. In some cases, high blood pressure during pregnancy can cause:  Stroke.  Heart attack.  Damage to the kidneys, lungs, or liver.  Preeclampsia.  Jerky movements you cannot control (convulsions or seizures).  Problems with the placenta. How does this affect my baby? Your baby may:  Be born early.  Not weigh as much as he or she should.  Not handle labor well, leading to a c-section birth. What are the risks?  Having high blood pressure during a past pregnancy.  Being overweight.  Being 85 years old or older.  Being pregnant for the first time.  Being pregnant with more than one baby.  Becoming pregnant using fertility methods, such as IVF.  Having other problems, such as diabetes, or kidney disease.  Having family members who have high blood pressure. What can I do to lower my risk?   Keep a healthy weight.  Eat a healthy diet.  Follow what your doctor tells you about treating any medical problems that you had before becoming pregnant. It is very important to go to all of your doctor visits. Your doctor will check your blood pressure and make sure that your pregnancy is progressing as it should. Treatment should start early if a problem is found. How is this treated? Treatment for high blood pressure during pregnancy can differ depending on the type of high blood pressure you have and how serious it is.  You may need to take blood pressure medicine.  If you have been taking medicine for your blood pressure, you may need to change the medicine during pregnancy if it is not safe for your baby.  If your doctor thinks that you could get very high blood pressure, he or she may tell you to take a low-dose aspirin during your pregnancy.  If you have very high blood pressure, you may need to stay in the hospital so you and your baby can be watched closely. You may also need to take medicine to lower your blood pressure.  This medicine may be given by  mouth or through an IV tube.  In some cases, if your condition gets worse, you may need to have your baby early. Follow these instructions at home: Eating and drinking   Drink enough fluid to keep your pee (urine) pale yellow.  Avoid caffeine. Lifestyle  Do not use any products that contain nicotine or tobacco, such as cigarettes, e-cigarettes, and chewing tobacco. If you need help quitting, ask your doctor.  Do not use alcohol or drugs.  Avoid stress.  Rest and get plenty of sleep.  Regular exercise can help. Ask your doctor what kinds of exercise are best for you. General instructions  Take over-the-counter and prescription medicines only as told by your doctor.  Keep all prenatal and follow-up visits as told by your doctor. This is important. Contact a doctor if:  You have symptoms that your doctor told you to watch for, such as: ? Headaches. ? Nausea. ? Vomiting. ? Belly (abdominal) pain. ? Dizziness. ? Light-headedness. Get help right away if:  You have: ? Very bad belly pain that does not get better with treatment. ? A very bad headache that does not get better. ? Vomiting that does not get better. ? Sudden, fast weight gain. ? Sudden swelling in your hands, ankles, or face. ? Bleeding from your vagina. ? Blood in your pee. ? Blurry vision. ? Double vision. ? Shortness of breath. ? Chest pain. ? Weakness on one side of your body. ? Trouble talking.  Your baby is not moving as much as usual. Summary  High blood pressure is also called hypertension.  High blood pressure means that the force of your blood moving in your body is too strong.  High blood pressure can cause problems for you and your baby.  Keep all follow-up visits as told by your doctor. This is important. This information is not intended to replace advice given to you by your health care provider. Make sure you discuss any questions you have with your health  care provider. Document Released: 10/10/2010 Document Revised: 12/29/2018 Document Reviewed: 10/04/2018 Elsevier Patient Education  2020 [employer].

## 2019-08-09 LAB — COMPREHENSIVE METABOLIC PANEL
ALT: 11 IU/L (ref 0–32)
AST: 13 IU/L (ref 0–40)
Albumin/Globulin Ratio: 2.3 — ABNORMAL HIGH (ref 1.2–2.2)
Albumin: 4.3 g/dL (ref 3.8–4.8)
Alkaline Phosphatase: 51 IU/L (ref 39–117)
BUN/Creatinine Ratio: 11 (ref 9–23)
BUN: 6 mg/dL (ref 6–20)
Bilirubin Total: 0.6 mg/dL (ref 0.0–1.2)
CO2: 20 mmol/L (ref 20–29)
Calcium: 9.3 mg/dL (ref 8.7–10.2)
Chloride: 102 mmol/L (ref 96–106)
Creatinine, Ser: 0.54 mg/dL — ABNORMAL LOW (ref 0.57–1.00)
GFR calc Af Amer: 138 mL/min/{1.73_m2} (ref >59)
GFR calc non Af Amer: 120 mL/min/{1.73_m2} (ref >59)
Globulin, Total: 1.9 g/dL (ref 1.5–4.5)
Glucose: 89 mg/dL (ref 65–99)
Potassium: 4.4 mmol/L (ref 3.5–5.2)
Sodium: 136 mmol/L (ref 134–144)
Total Protein: 6.2 g/dL (ref 6.0–8.5)

## 2019-08-09 LAB — PROTEIN / CREATININE RATIO, URINE
Creatinine, Urine: 180.4 mg/dL
Protein, Ur: 10.7 mg/dL
Protein/Creat Ratio: 59 mg/g creat (ref 0–200)

## 2019-08-14 ENCOUNTER — Telehealth: Payer: Self-pay

## 2019-08-14 NOTE — Telephone Encounter (Signed)
Pt states went to PCP for knee pain. Pt states PCP would not prescribe anything because she is pregnanat. Call pt  [phone].

## 2019-08-21 NOTE — Telephone Encounter (Signed)
Is she wearing a knee brace, using ice packs/warm compresses to the knee? Since this message was sent 7 days ago, is she still even having pain?

## 2019-08-21 NOTE — Telephone Encounter (Signed)
Pt is having knee pain and stated that it is hard for her to walk and stand for long periods of time. Pt is requesting pain medication other than tylenol which the pt stated that she not helping. Please advise. Thanks [employer]

## 2019-08-23 ENCOUNTER — Other Ambulatory Visit: Payer: Self-pay | Admitting: Obstetrics and Gynecology

## 2019-08-23 MED ORDER — TRAMADOL HCL 50 MG PO TABS: 50.0000 mg | ORAL_TABLET | Freq: Four times a day (QID) | ORAL | 0 refills | Status: DC | PRN

## 2019-08-25 NOTE — Telephone Encounter (Signed)
Please see [facility] messages.

## 2019-09-05 ENCOUNTER — Other Ambulatory Visit (HOSPITAL_COMMUNITY)
Admission: RE | Admit: 2019-09-05 | Discharge: 2019-09-05 | Disposition: A | Discharge status: Home / Self Care | Payer: Medicaid Other | Source: Ambulatory Visit | Attending: Obstetrics and Gynecology | Admitting: Obstetrics and Gynecology

## 2019-09-05 ENCOUNTER — Encounter: Payer: Self-pay | Admitting: Obstetrics and Gynecology

## 2019-09-05 ENCOUNTER — Other Ambulatory Visit: Payer: Self-pay

## 2019-09-05 ENCOUNTER — Ambulatory Visit (INDEPENDENT_AMBULATORY_CARE_PROVIDER_SITE_OTHER): Payer: Medicaid Other | Admitting: Obstetrics and Gynecology

## 2019-09-05 VITALS — BP 115/76 | HR 94 | Wt 191.3 lb

## 2019-09-05 DIAGNOSIS — B373 Candidiasis of vulva and vagina: Secondary | ICD-10-CM | POA: Insufficient documentation

## 2019-09-05 DIAGNOSIS — Z3A15 15 weeks gestation of pregnancy: Secondary | ICD-10-CM

## 2019-09-05 DIAGNOSIS — O09521 Supervision of elderly multigravida, first trimester: Secondary | ICD-10-CM

## 2019-09-05 DIAGNOSIS — B3731 Acute candidiasis of vulva and vagina: Secondary | ICD-10-CM

## 2019-09-05 LAB — POCT URINALYSIS DIPSTICK OB
Bilirubin, UA: NEGATIVE
Blood, UA: NEGATIVE
Glucose, UA: NEGATIVE
Ketones, UA: NEGATIVE
Leukocytes, UA: NEGATIVE
Nitrite, UA: NEGATIVE
POC,PROTEIN,UA: NEGATIVE
Spec Grav, UA: 1.01 (ref 1.010–1.025)
Urobilinogen, UA: 0.2 E.U./dL
pH, UA: 6.5 (ref 5.0–8.0)

## 2019-09-05 NOTE — Progress Notes (Signed)
ROB: Patient continues to complain of vaginal discharge.  She says she would "like to be tested for everything" although she is "not worried" about STDs.  Wet prep performed-monilia noted, negative for BV negative trichomonas.  Discussed use of over-the-counter Monistat.  GC/CT sent.  Patient desires AFP today.  FAS next visit.  Patient still smoking 3 to 4 cigarettes/day.  Advised to stop.  Patient states that she is no longer smoking marijuana.  Breast-feeding discussed-see topics.

## 2019-09-05 NOTE — Addendum Note (Signed)
Addended by: Durwin Glaze on: 09/05/2019 02:05 PM   Modules accepted: Orders

## 2019-09-05 NOTE — Progress Notes (Signed)
Patient comes in today for Vanessa Franco visit. She believes she had a yeast infection.

## 2019-09-05 NOTE — Addendum Note (Signed)
Addended by: Durwin Glaze on: 09/05/2019 03:20 PM   Modules accepted: Orders

## 2019-09-07 LAB — AFP, SERUM, OPEN SPINA BIFIDA
AFP MoM: 1.47
AFP Value: 40.4 ng/mL
Gest. Age on Collection Date: 15 weeks
Maternal Age At EDD: 38.6 yr
OSBR Risk 1 IN: 5931
Test Results:: NEGATIVE
Weight: 191 [lb_av]

## 2019-09-07 LAB — CERVICOVAGINAL ANCILLARY ONLY
Chlamydia: NEGATIVE
Comment: NEGATIVE
Comment: NORMAL
Neisseria Gonorrhea: NEGATIVE

## 2019-09-18 ENCOUNTER — Other Ambulatory Visit: Payer: Self-pay

## 2019-09-18 NOTE — Telephone Encounter (Signed)
I thought I had already placed an urgent referral for her to physical therapy, but if not, please do so.  If I have, can we check on the status.   Dr. Marcelline Mates

## 2019-09-20 ENCOUNTER — Encounter: Payer: Self-pay | Admitting: Obstetrics and Gynecology

## 2019-09-21 ENCOUNTER — Other Ambulatory Visit: Payer: Self-pay

## 2019-09-21 DIAGNOSIS — O09521 Supervision of elderly multigravida, first trimester: Secondary | ICD-10-CM

## 2019-09-21 DIAGNOSIS — M5430 Sciatica, unspecified side: Secondary | ICD-10-CM

## 2019-09-22 NOTE — L&D Delivery Note (Signed)
Delivery Summary for Vanessa Franco  Labor Events:   Preterm labor: No data found  Rupture date: No data found  Rupture time: No data found  Rupture type: No data found  Fluid Color: Clear White  Induction: No data found  Augmentation: No data found  Complications: No data found  Cervical ripening: No data found No data found   No data found     Delivery:   Episiotomy: No data found  Lacerations: No data found  Repair suture: No data found  Repair # of packets: No data found  Blood loss (ml): No data found   Information for the patient's newborn:  Javaria, Kruggel [id number]    Delivery 02/11/2020 9:59 AM by  Vaginal, Spontaneous Sex:  female Gestational Age: [redacted]w[redacted]d Delivery Clinician:   Living?:         APGARS  One minute Five minutes Ten minutes  Skin color:        Heart rate:        Grimace:        Muscle tone:        Breathing:        Totals: 8  9      Presentation/position:      Resuscitation:   Cord information:    Disposition of cord blood:     Blood gases sent?  Complications:   Placenta: Delivered:       appearance Newborn Measurements: Weight: 5 lb 15.6 oz (2710 g)  Height: 18.5"  Head circumference:    Chest circumference:    Other providers:    Additional  information: Forceps:   Vacuum:   Breech:   Observed anomalies        Delivery Note At 9:59 AM a viable and healthy female was delivered via Vaginal, Spontaneous (Presentation: Left Occiput Anterior).  APGAR: 8, 9; weight  2710 grams.   Placenta status: Spontaneous, Intact.  Cord: 3-vessel with the following complications: None.  Cord pH: not obtained.  Delayed cord clamping observed.   Anesthesia: Epidural Episiotomy: None Lacerations: 1st degree;Labial (bilateral, hemostatic) Suture Repair: None Est. Blood Loss (mL):  50  Mom to postpartum.  Baby to Couplet care / Skin to Skin.  [name] 02/11/2020, 10:20 AM

## 2019-09-27 ENCOUNTER — Telehealth: Payer: Self-pay | Admitting: Obstetrics and Gynecology

## 2019-09-27 ENCOUNTER — Ambulatory Visit: Payer: Medicaid Other | Attending: Obstetrics and Gynecology

## 2019-09-27 ENCOUNTER — Other Ambulatory Visit: Payer: Self-pay

## 2019-09-27 DIAGNOSIS — M5442 Lumbago with sciatica, left side: Secondary | ICD-10-CM | POA: Diagnosis present

## 2019-09-27 DIAGNOSIS — R293 Abnormal posture: Secondary | ICD-10-CM | POA: Diagnosis present

## 2019-09-27 DIAGNOSIS — M62838 Other muscle spasm: Secondary | ICD-10-CM | POA: Diagnosis present

## 2019-09-27 NOTE — Therapy (Signed)
[place] MAIN [facility] SERVICES [address] [place], [place], [address] Phone: [phone]   Fax:  [phone]  Physical Therapy Evaluation  The patient has been informed of current processes in place at Outpatient Rehab to protect patients from Covid-19 exposure including social distancing, schedule modifications, and new cleaning procedures. After discussing their particular risk with a therapist based on the patient's personal risk factors, the patient has decided to proceed with in-person therapy.   Patient Details  Name: Vanessa Franco MRN: [id number] Date of Birth: [birth year] No data recorded  Encounter Date: 09/27/2019  PT End of Session - 09/27/19 1309    Visit Number  1    Number of Visits  10    Date for PT Re-Evaluation  12/06/19    Authorization Type  mcaid    Authorization Time Period  09/27/19-10/18/19    Authorization - Visit Number  1    Authorization - Number of Visits  4    PT Start Time  1100    PT Stop Time  1200    PT Time Calculation (min)  60 min    Activity Tolerance  Patient tolerated treatment well;No increased pain    Behavior During Therapy  WFL for tasks assessed/performed       Past Medical History:  Diagnosis Date  . Anxiety   . Depression   . Fibroid   . History of D&C   . History of uterine fibroid 11/2013   [facility]    Past Surgical History:  Procedure Laterality Date  . DILATION AND CURETTAGE OF UTERUS    . MYOMECTOMY  2015   Hysteroscopic    There were no vitals filed for this visit.       [facility] PT Assessment - 09/27/19 0001      Balance Screen   Has the patient fallen in the past 6 months  No        Pelvic Floor Physical Therapy Evaluation and Assessment  SCREENING  Falls in last 6 mo: no  Red Flags:  Have you had any night sweats? no Unexplained weight loss? no Saddle anesthesia? no Unexplained changes in bowel or bladder habits? no  SUBJECTIVE  Patient reports: She did  not have these issues with her prior pregnancy but is having frequent urination, nocturia, pain. Pain started at ~ [redacted] weeks pregnant. Some days are better than others, uses heating pads to decrease pain. Has tried ice but it increased pain. Increases when she is on her feet a lot. Is worried about going back to work on Monday because her pain increases when she is at work by 1-2pm  The pain is unbearable.   Precautions:  [redacted] weeks pregnant.   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   Recent Procedures/Tests/Findings:  none  Obstetrical History: 1 vaginal delivery with small tear, 1 D&C  Gynecological History: 1 fibroid in 2014  Urinary History: Has pain/pressure with urination and frequency ~ every 20-25 min. Incomplete emptying and nocturia x2-3  Gastrointestinal History: Every-other day and not having to strain but has to wait a long time.  Sexual activity/pain: Has started to have pain with intercourse with deeper thrusting.   Location of pain: LLB, radiating into LLE to foot Current pain:  5/10  Max pain:  10+/10 Least pain:  1/10 Nature of pain: Sharp shooting  Patient Goals: Get some relief from her pain so she can play with her daughter and continue to work through  her pregnancy.   OBJECTIVE  Posture/Observations:  Sitting:  Standing: L PSIS high Supine: L ASIS high, L ankle high.  Palpation/Segmental Motion/Joint Play: TTP to L SIJ, L >R adductors, Lumbar extensors, hip-flexors  Special tests:   Stork: decreased mobility on L with increased pain during hip flexion.  Leg-length: 90.5cm B  Supine-to-long-sit: LLE long in both  Range of Motion/Flexibilty:  Spine: R SB ~ 3 fingers from knee (pain on L), L SB ~ 1 finger from knee.  Hips:   Strength/MMT: Deferred to follow up LE MMT  LE MMT Left Right  Hip flex:  (L2) /5 /5  Hip ext: /5 /5  Hip abd: /5 /5  Hip add: /5 /5  Hip IR /5 /5  Hip ER /5 /5     Abdominal:   Palpation: TTP to L>R hip-flexors and pectineus Diastasis: none  Pelvic Floor External Exam: Deferred to follow-up Introitus Appears:  Skin integrity:  Palpation: Cough: Prolapse visible?: Scar mobility:  Internal Vaginal Exam: Strength (PERF):  Symmetry: Palpation: Prolapse:   Internal Rectal Exam: Strength (PERF): Symmetry: Palpation: Prolapse:   Gait Analysis: Deferred to follow up   Pelvic Floor Outcome Measures: FOTO:88%, PGQ: 51/75 (68%)  INTERVENTIONS THIS SESSION: Self-care: Educated on the structure and function of the pelvic floor in relation to their symptoms as well as the POC, and initial HEP in order to set patient expectations and understanding from which we will build on in the future sessions. Educated on Chemical engineer.  Manual: Performed TP release to L QL followed by up-slip correction to improve pelvic alignment and decrease pain.   Therex: Educated on L side-stretch and diaphragmatic breathing To maintain and improve muscle length and allow for improved balance of musculature for long-term symptom relief and decrease neural sensitivity fr decreased stress and pain.  Total time: 60 min.           Objective measurements completed on examination: See above findings.                PT Short Term Goals - 09/27/19 1328      PT SHORT TERM GOAL #1   Title  Patient will demonstrate improved pelvic alignment and balance of musculature surrounding the pelvis to facilitate decreased PFM spasms and decrease pelvic pain.    Baseline  L up-slip and spasms surrounding L>R pelvis.    Time  3    Period  Weeks    Status  New    Target Date  10/18/19      PT SHORT TERM GOAL #2   Title  Patient will demonstrate HEP x1 in the clinic to demonstrate understanding and proper form to allow for further improvement.    Baseline  Pt. lacks knowledge of therepeutic exercises to decrease pain.    Time  3    Period  Weeks    Status  New     Target Date  10/18/19      PT SHORT TERM GOAL #3   Title  Patient will demonstrate a coordinated contraction, relaxation, and bulge of the pelvic floor muscles to demonstrate functional recruitment and motion and allow for further strengthening.    Baseline  Pt. having frequency and incomplete emptying, suggesting difficulty with coordinating relaxation of the PFM.    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    Baseline  Pt. has "unbearable pain" by 1-2 PM when working    Time  10    Period  Weeks    Status  New    Target Date  12/06/19      PT LONG TERM GOAL #2   Title  Patient will score at or below 13% on the PGQ to demonstrate a clinically significant decrease in disability and improved functional ability.    Baseline  PGQ: 51/75    Time  10    Period  Weeks    Status  New    Target Date  12/06/19      PT LONG TERM GOAL #3   Title  Pt. will score less than 22% on the FOTO to demonstrate improved function.    Baseline  FOTO: 88%    Time  10    Period  Weeks    Status  New    Target Date  12/06/19      PT LONG TERM GOAL #4   Title  Patient will report urinating less than or equal to 8-10 times per day and 1 time at night over the course of the prior week to demonstrate decreased frequency.    Baseline  urinating every ~25 min. and feeling incomplete emptying.    Time  10    Period  Weeks    Status  New    Target Date  12/06/19             Plan - 09/27/19 1312    Clinical Impression Statement  Pt. is a 39 y/o female who presents today with cheif c/o LB and radicular LLE pain starting in first trimester of pregnancy. She has a PMH significant for one vaginal delivery with minor tearing, anxiety, depression, 1 D&C, and a uterine  fibroid in 2015. Her clinical exam revealed a L up-slip and spasms surrounding her L>R hip as well as hyperlordosis and lower crossed syndrome. She responded well to TP release and up-slip correction at today's visit, with a decrease in pain from 4/10 to 0/10 but continues to have spasms in adductors and hip-flexors on L>R and poor posture which will likely lead to a return of pain without further treatment. We will continue treatments for 10 weeks at 1x/wk to allow for decreased spasms and pain as well as improvement in balance of musculature, posture, and understanding of body mechanics to prevent return of pain and allow for Pt. to continue working and caring for her daughter through her pregnancy.    Personal Factors and Comorbidities  Comorbidity 2;Social Background    Comorbidities  Anxiety, depression    Examination-Activity Limitations  Continence;Caring for Others;Locomotion Level;Stairs;Squat;Lift;Bend;Dressing;Bed Mobility;Transfers;Sit;Stand    Examination-Participation Restrictions  Other;Interpersonal Relationship;Laundry;Cleaning;Community Activity;Shop   work   Merchant [employer] officer  Stable/Uncomplicated    Designer, jewellery  Low    Rehab Potential  Excellent    PT Frequency  1x / week    PT Duration  Other (comment)   10 weeks   PT Treatment/Interventions  ADLs/Self Care Home Management;Aquatic Therapy;Moist Heat;Ultrasound;Functional mobility training;Therapeutic activities;Therapeutic exercise;Neuromuscular re-education;Patient/family education;Manual techniques;Dry needling;Passive range of motion;Joint Manipulations;Spinal Manipulations    PT Next Visit Plan  TP release to L>R hip-flexors and extensors, adductors. gve hip-flexor stretch and strengthening for lower crossed. assess upper thoracic mobility.  PT Home Exercise Plan  side-stretch, diaphragmatic breathing, supine-to-sit transfer.    Consulted and Agree with Plan of Care  Patient       Patient  will benefit from skilled therapeutic intervention in order to improve the following deficits and impairments:  Increased muscle spasms, Improper body mechanics, Decreased activity tolerance, Impaired flexibility, Postural dysfunction, Pain  Visit Diagnosis: Acute left-sided low back pain with left-sided sciatica  Other muscle spasm  Abnormal posture     Problem List Patient Active Problem List   Diagnosis Date Noted  . Pre-existing essential hypertension during pregnancy in first trimester 06/23/2019  . Anxiety 06/23/2019  . Tobacco abuse 02/04/2016  . Multigravida of advanced maternal age in first trimester 02/04/2016   Willa Rough DPT, ATC Willa Rough 09/27/2019, 1:39 PM  [place] MAIN [facility] SERVICES [address] [place], [place], [address] Phone: [phone]   Fax:  [phone]  Name: Vanessa Franco MRN: [id number] Date of Birth: August 26, 1981

## 2019-09-27 NOTE — Telephone Encounter (Signed)
Pt has a dentist appt and the office needs an approval for x-ray.  The [employer] number [phone]

## 2019-09-27 NOTE — Patient Instructions (Signed)
Stabilization: Diaphragmatic Breathing    Lie with knees bent, feet flat. Place one hand on stomach, other on chest. Breathe deeply through nose, lifting belly hand without any motion of hand on chest. Do this for ~ 5 min. Per night and whenever you can remember throughout the day.    Hold for 30 seconds (5 deep breaths) and repeat 2-3 times on each side once a day  Getting In/out of bed     Lying on back, bend left knee and place left arm across chest. Roll all in one movement to the right. Reverse to roll to the left. Always move as one unit.     Once you are lying on you side, move legs to edge of bed. Pull in the pelvic floor and lower tummy and push down with both hands while moving legs off bed to reach sitting position.  *Reverse sequence to return to lying down.  Copyright  VHI. All rights reserved.     Copyright  VHI. All rights reserved.

## 2019-09-27 NOTE — Telephone Encounter (Signed)
Pt is aware that her information has been faxed.

## 2019-09-28 ENCOUNTER — Ambulatory Visit: Payer: Medicaid Other

## 2019-10-02 NOTE — Patient Instructions (Addendum)

## 2019-10-02 NOTE — Progress Notes (Signed)
ROB-Pt present for routine prenatal care and u/s for anatomy. Pt stated having some discharge that she think maybe yeast. Pt stated using monistat that is not helping.

## 2019-10-03 ENCOUNTER — Other Ambulatory Visit: Payer: Self-pay | Admitting: Obstetrics and Gynecology

## 2019-10-03 ENCOUNTER — Ambulatory Visit (INDEPENDENT_AMBULATORY_CARE_PROVIDER_SITE_OTHER): Payer: Medicaid Other

## 2019-10-03 ENCOUNTER — Encounter: Payer: Self-pay | Admitting: Obstetrics and Gynecology

## 2019-10-03 ENCOUNTER — Ambulatory Visit (INDEPENDENT_AMBULATORY_CARE_PROVIDER_SITE_OTHER): Payer: Medicaid Other | Admitting: Obstetrics and Gynecology

## 2019-10-03 ENCOUNTER — Other Ambulatory Visit: Payer: Self-pay

## 2019-10-03 VITALS — BP 107/68 | HR 85 | Wt 188.9 lb

## 2019-10-03 DIAGNOSIS — O09521 Supervision of elderly multigravida, first trimester: Secondary | ICD-10-CM

## 2019-10-03 DIAGNOSIS — B9689 Other specified bacterial agents as the cause of diseases classified elsewhere: Secondary | ICD-10-CM

## 2019-10-03 DIAGNOSIS — N926 Irregular menstruation, unspecified: Secondary | ICD-10-CM

## 2019-10-03 DIAGNOSIS — O10919 Unspecified pre-existing hypertension complicating pregnancy, unspecified trimester: Secondary | ICD-10-CM

## 2019-10-03 DIAGNOSIS — N760 Acute vaginitis: Secondary | ICD-10-CM

## 2019-10-03 DIAGNOSIS — Z363 Encounter for antenatal screening for malformations: Secondary | ICD-10-CM | POA: Diagnosis not present

## 2019-10-03 DIAGNOSIS — Z720 Tobacco use: Secondary | ICD-10-CM

## 2019-10-03 DIAGNOSIS — O09522 Supervision of elderly multigravida, second trimester: Secondary | ICD-10-CM

## 2019-10-03 LAB — POCT URINALYSIS DIPSTICK OB
Bilirubin, UA: NEGATIVE
Blood, UA: NEGATIVE
Glucose, UA: NEGATIVE
Ketones, UA: NEGATIVE
Leukocytes, UA: NEGATIVE
Nitrite, UA: NEGATIVE
POC,PROTEIN,UA: NEGATIVE
Spec Grav, UA: >=1.03 — AB (ref 1.010–1.025)
Urobilinogen, UA: 0.2 E.U./dL
pH, UA: 6 (ref 5.0–8.0)

## 2019-10-03 MED ORDER — METRONIDAZOLE 500 MG PO TABS: 500.0000 mg | ORAL_TABLET | Freq: Two times a day (BID) | ORAL | 0 refills | Status: DC

## 2019-10-03 MED ORDER — NICOTINE 7 MG/24HR TD PT24: 7.0000 mg | MEDICATED_PATCH | Freq: Every day | TRANSDERMAL | 1 refills | Status: DC

## 2019-10-03 NOTE — Progress Notes (Signed)
ROB: Patient complains of vaginal discharge for several weeks with odor, has tried Monistat OTC (x 2, tried 3 day and 7 day course) without relief. Denies vaginal itching or burning. Wet prep done, BV noted. Flagyl sent in for BV infection.S/p normal anatomy scan. Still smoking, but down to ~ 7 cig/day. Will decrease nicotine patch to 7 mg.  Is doing physical therapy for her leg which has helped tremendously. Discussed circumcision for female infant.  RTC in 4 weeks.

## 2019-10-04 ENCOUNTER — Other Ambulatory Visit: Payer: Medicaid Other

## 2019-10-04 ENCOUNTER — Ambulatory Visit: Payer: Medicaid Other

## 2019-10-04 ENCOUNTER — Encounter: Payer: Medicaid Other | Admitting: Obstetrics and Gynecology

## 2019-10-05 ENCOUNTER — Ambulatory Visit: Payer: Self-pay

## 2019-10-12 ENCOUNTER — Ambulatory Visit: Payer: Medicaid Other

## 2019-10-12 ENCOUNTER — Other Ambulatory Visit: Payer: Self-pay

## 2019-10-12 ENCOUNTER — Ambulatory Visit (INDEPENDENT_AMBULATORY_CARE_PROVIDER_SITE_OTHER): Payer: Medicaid Other | Admitting: Obstetrics and Gynecology

## 2019-10-12 VITALS — BP 123/70 | HR 94 | Ht 67.0 in | Wt 193.4 lb

## 2019-10-12 DIAGNOSIS — R293 Abnormal posture: Secondary | ICD-10-CM

## 2019-10-12 DIAGNOSIS — Z23 Encounter for immunization: Secondary | ICD-10-CM

## 2019-10-12 DIAGNOSIS — M5442 Lumbago with sciatica, left side: Secondary | ICD-10-CM

## 2019-10-12 DIAGNOSIS — M62838 Other muscle spasm: Secondary | ICD-10-CM

## 2019-10-12 NOTE — Therapy (Signed)
[place] MAIN [facility] SERVICES [address] [place], [place], [address] Phone: [phone]   Fax:  [phone]  Physical Therapy Treatment  The patient has been informed of current processes in place at Outpatient Rehab to protect patients from Covid-19 exposure including social distancing, schedule modifications, and new cleaning procedures. After discussing their particular risk with a therapist based on the patient's personal risk factors, the patient has decided to proceed with in-person therapy.   Patient Details  Name: Vanessa Franco MRN: [id number] Date of Birth: [birth year] No data recorded  Encounter Date: 10/12/2019  PT End of Session - 10/12/19 1531    Visit Number  2    Number of Visits  10    Date for PT Re-Evaluation  12/06/19    Authorization Type  mcaid    Authorization Time Period  09/27/19-10/18/19    Authorization - Visit Number  2    Authorization - Number of Visits  4    PT Start Time  [id number]    PT Stop Time  1623    PT Time Calculation (min)  53 min    Activity Tolerance  Patient tolerated treatment well;No increased pain    Behavior During Therapy  WFL for tasks assessed/performed       Past Medical History:  Diagnosis Date  . Anxiety   . Depression   . Fibroid   . History of D&C   . History of uterine fibroid 11/2013   [facility]    Past Surgical History:  Procedure Laterality Date  . DILATION AND CURETTAGE OF UTERUS    . MYOMECTOMY  2015   Hysteroscopic    There were no vitals filed for this visit.    Pelvic Floor Physical Therapy Treatment Note  SCREENING  Changes in medications, allergies, or medical history?: none    SUBJECTIVE  Patient reports: She does not know what we did last time but it really worked, she has not had any pain since. Has not had pain with intercourse, constipation, or dyspareunia.  Precautions:  [redacted] weeks pregnant.  Pain update: Location of pain: LLB, radiating into LLE to  foot Current pain: 0/10  Max pain: 0/10 Least pain: 0/10 Nature of pain:Sharp shooting  Patient Goals: Get some relief from her pain so she can play with her daughter and continue to work through her pregnancy.  OBJECTIVE  Changes in: Posture/Observations:  Genu valgus and anterior pelvic tilt.  Range of Motion/Flexibilty:  Decreased hip EXT, ABD, and lumbar Flexion B.  Strength/MMT:  LE MMT:  Pelvic floor:  Abdominal:   Palpation:  Gait Analysis:  INTERVENTIONS THIS SESSION: Therex: Educated on and practiced seated hip flexor stretch, seated adductor stretch, seated lumbar stretch To maintain and improve muscle length and allow for improved balance of musculature for long-term symptom relief. Educated on and practiced quarter squats, seated pelvic tilts, and attempted cat/cow with TA activation (was D/Ced due to Pt. Having difficulty performing consistently with less than Mod cueing) to improve strength of muscles opposing tight musculature to allow reciprocal inhibition to improve balance of musculature surrounding the pelvis and improve overall posture for optimal musculature length-tension relationship and function. .   Total time: 53 min.                           PT Short Term Goals - 09/27/19 1328      PT SHORT TERM GOAL #1   Title  Patient  will demonstrate improved pelvic alignment and balance of musculature surrounding the pelvis to facilitate decreased PFM spasms and decrease pelvic pain.    Baseline  L up-slip and spasms surrounding L>R pelvis.    Time  3    Period  Weeks    Status  New    Target Date  10/18/19      PT SHORT TERM GOAL #2   Title  Patient will demonstrate HEP x1 in the clinic to demonstrate understanding and proper form to allow for further improvement.    Baseline  Pt. lacks knowledge of therepeutic exercises to decrease pain.    Time  3    Period  Weeks    Status  New    Target Date  10/18/19      PT SHORT  TERM GOAL #3   Title  Patient will demonstrate a coordinated contraction, relaxation, and bulge of the pelvic floor muscles to demonstrate functional recruitment and motion and allow for further strengthening.    Baseline  Pt. having frequency and incomplete emptying, suggesting difficulty with coordinating relaxation of the PFM.    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    Baseline  Pt. has "unbearable pain" by 1-2 PM when working    Time  10    Period  Weeks    Status  New    Target Date  12/06/19      PT LONG TERM GOAL #2   Title  Patient will score at or below 13% on the PGQ to demonstrate a clinically significant decrease in disability and improved functional ability.    Baseline  PGQ: 51/75    Time  10    Period  Weeks    Status  New    Target Date  12/06/19      PT LONG TERM GOAL #3   Title  Pt. will score less than 22% on the FOTO to demonstrate improved function.    Baseline  FOTO: 88%    Time  10    Period  Weeks    Status  New    Target Date  12/06/19      PT LONG TERM GOAL #4   Title  Patient will report urinating less than or equal to 8-10 times per day and 1 time at night over the course of the prior week to demonstrate decreased frequency.    Baseline  urinating every ~25 min. and feeling incomplete emptying.    Time  10    Period  Weeks    Status  New    Target Date  12/06/19            Plan - 10/12/19 1625    Clinical Impression Statement  Pt. Responded well to all interventions today, demonstrating improved pain that has remained low for 2 weeks, improved pelvic alignment, improved coordination and recruitment of the muscles surrounding the pelvis, as well as understanding and correct performance of all education and  exercises provided today. They will continue to benefit from skilled physical therapy to work toward remaining goals and maximize function as well as decrease likelihood of  symptom increase or recurrence.     PT Next Visit Plan  review cat/cow TA engagement and assign for HEP, deepen squat ROM, hip ABD strengthening, assess upper thoracic mobility.    PT Home Exercise Plan  side-stretch, diaphragmatic breathing, supine-to-sit transfer, hip-flexor stretch, adductor stretch, low back stretch.    Consulted and Agree with Plan of Care  Patient       Patient will benefit from skilled therapeutic intervention in order to improve the following deficits and impairments:     Visit Diagnosis: Acute left-sided low back pain with left-sided sciatica  Other muscle spasm  Abnormal posture     Problem List Patient Active Problem List   Diagnosis Date Noted  . Supervision of high risk elderly multigravida in second trimester 10/03/2019  . Chronic hypertension in pregnancy 06/23/2019  . Anxiety 06/23/2019  . Tobacco abuse 02/04/2016  . Multigravida of advanced maternal age in first trimester 02/04/2016   Willa Rough DPT, ATC Willa Rough 10/12/2019, 4:34 PM  [place] MAIN [facility] SERVICES [address] [place] [address] [place], [place], [address] Phone: [phone]   Fax:  [phone]  Name: Vanessa Franco MRN: [id number] Date of Birth: October 30, 1980

## 2019-10-12 NOTE — Progress Notes (Signed)
Patient presents for a NV for a flu injection.

## 2019-10-18 ENCOUNTER — Ambulatory Visit: Payer: Medicaid Other

## 2019-10-19 ENCOUNTER — Telehealth: Payer: Self-pay | Admitting: Obstetrics and Gynecology

## 2019-10-19 NOTE — Telephone Encounter (Signed)
Pt called in and stated they are not feeling well, headache and she has a Mudlogger  taste in her month. Pt is requesting a call from the nurse. Please advise

## 2019-10-20 NOTE — Telephone Encounter (Signed)
Pt stated that she got tested for COVID at here Primary office. Pt stated that she was unable to drink anything due to the metal taste in her mouth. Pt was advise to try flavored drinks to help with the taste. Pt stated that she would try that.

## 2019-10-23 ENCOUNTER — Telehealth: Payer: Self-pay

## 2019-10-23 NOTE — Telephone Encounter (Signed)
Pt called to check on her symptoms since Friday. Pt stated that she was doing a little better but her covid test came back negative. Pt stated she was not having a fever, sob or any other symptoms just sinuses issues and weird taste in her mouth.

## 2019-10-24 ENCOUNTER — Ambulatory Visit: Payer: Medicaid Other

## 2019-10-31 ENCOUNTER — Encounter: Payer: Self-pay | Admitting: Obstetrics and Gynecology

## 2019-10-31 ENCOUNTER — Ambulatory Visit (INDEPENDENT_AMBULATORY_CARE_PROVIDER_SITE_OTHER): Payer: Medicaid Other | Admitting: Obstetrics and Gynecology

## 2019-10-31 ENCOUNTER — Other Ambulatory Visit: Payer: Self-pay

## 2019-10-31 VITALS — BP 118/77 | HR 86 | Wt 192.0 lb

## 2019-10-31 DIAGNOSIS — O09522 Supervision of elderly multigravida, second trimester: Secondary | ICD-10-CM | POA: Diagnosis not present

## 2019-10-31 LAB — POCT URINALYSIS DIPSTICK OB
Bilirubin, UA: NEGATIVE
Blood, UA: NEGATIVE
Glucose, UA: NEGATIVE
Ketones, UA: NEGATIVE
Leukocytes, UA: NEGATIVE
Nitrite, UA: NEGATIVE
Spec Grav, UA: 1.01 (ref 1.010–1.025)
Urobilinogen, UA: 0.2 E.U./dL
pH, UA: 5.5 (ref 5.0–8.0)

## 2019-10-31 NOTE — Progress Notes (Signed)
Patient comes in today for Vanessa Franco visit. Patient stated that baby is sitting low and causing pain.

## 2019-10-31 NOTE — Progress Notes (Signed)
ROB: Patient complains of general aches and pains in her pelvis from the baby being "too low".  Denies contractions.  Reports daily fetal movement.  Says that her antibiotic for vaginitis and her stretching exercises for leg pain have helped a great deal.  1 hour GCT next visit.

## 2019-11-01 ENCOUNTER — Ambulatory Visit: Payer: Medicaid Other | Attending: Obstetrics and Gynecology

## 2019-11-03 ENCOUNTER — Observation Stay
Admission: EM | Admit: 2019-11-03 | Discharge: 2019-11-03 | Disposition: A | Discharge status: Home / Self Care | Payer: Medicaid Other | Attending: Obstetrics and Gynecology | Admitting: Obstetrics and Gynecology

## 2019-11-03 ENCOUNTER — Encounter: Payer: Self-pay | Admitting: Obstetrics and Gynecology

## 2019-11-03 ENCOUNTER — Other Ambulatory Visit: Payer: Self-pay

## 2019-11-03 ENCOUNTER — Telehealth: Payer: Self-pay | Admitting: Obstetrics and Gynecology

## 2019-11-03 DIAGNOSIS — O99891 Other specified diseases and conditions complicating pregnancy: Secondary | ICD-10-CM

## 2019-11-03 DIAGNOSIS — Z3A23 23 weeks gestation of pregnancy: Secondary | ICD-10-CM | POA: Insufficient documentation

## 2019-11-03 DIAGNOSIS — R1030 Lower abdominal pain, unspecified: Secondary | ICD-10-CM | POA: Diagnosis not present

## 2019-11-03 DIAGNOSIS — O26892 Other specified pregnancy related conditions, second trimester: Principal | ICD-10-CM | POA: Diagnosis present

## 2019-11-03 LAB — URINALYSIS, ROUTINE W REFLEX MICROSCOPIC
Bilirubin Urine: NEGATIVE
Glucose, UA: NEGATIVE mg/dL
Hgb urine dipstick: NEGATIVE
Ketones, ur: NEGATIVE mg/dL
Leukocytes,Ua: NEGATIVE
Nitrite: NEGATIVE
Protein, ur: NEGATIVE mg/dL
Specific Gravity, Urine: 1.013 (ref 1.005–1.030)
pH: 7 (ref 5.0–8.0)

## 2019-11-03 MED ORDER — ACETAMINOPHEN-CODEINE #3 300-30 MG PO TABS
1.0000 | ORAL_TABLET | Freq: Four times a day (QID) | ORAL | Status: DC | PRN
  Administered 2019-11-03: 1 via ORAL

## 2019-11-03 NOTE — Final Progress Note (Signed)
L&D OB Triage Note  Vanessa Franco is a 39 y.o. [id number] female at [redacted]w[redacted]d, EDD Estimated Date of Delivery: 02/25/20 who presented to triage for complaints of lower abdominal pain x 1 day.  Reports pain lasted for 15 minutes this morning, made her double over. Last episode of intercourse was 3 days ago.  Patient denies nausea/vomiting, contractions, vaginal bleeding, urinary or bowel disturbances. Pain is currently rated 8/10.  She was evaluated by the nurses with no significant findings. Vital signs stable. An NST was performed and has been reviewed by MD. She was treated with Tylenol #3.   NST INTERPRETATION: Indications: rule out uterine contractions  Mode: External Baseline Rate (A): 145 bpm Variability: Moderate Accelerations: 10 x 10 Decelerations: None     Contraction Frequency (min): none  Impression: reactive   Labs:  Results for orders placed or performed during the hospital encounter of 11/03/19  Urinalysis, Routine w reflex microscopic  Result Value Ref Range   Color, Urine YELLOW (A) YELLOW   APPearance HAZY (A) CLEAR   Specific Gravity, Urine 1.013 1.005 - 1.030   pH 7.0 5.0 - 8.0   Glucose, UA NEGATIVE NEGATIVE mg/dL   Hgb urine dipstick NEGATIVE NEGATIVE   Bilirubin Urine NEGATIVE NEGATIVE   Ketones, ur NEGATIVE NEGATIVE mg/dL   Protein, ur NEGATIVE NEGATIVE mg/dL   Nitrite NEGATIVE NEGATIVE   Leukocytes,Ua NEGATIVE NEGATIVE      Plan: NST performed was reviewed and was found to be reactive. She wast treated with T#3 with resolution of her pain.  She was discharged home with bleeding/labor precautions.  Continue routine prenatal care. Follow up with OB/GYN as previously scheduled.     [name], MD  [facility]

## 2019-11-03 NOTE — OB Triage Note (Signed)
Pt co lower abdominal pain since last night. Reports good fetal movement. Denies vaginal bleeding, LOF. Vanessa Franco

## 2019-11-03 NOTE — Telephone Encounter (Signed)
Pt called no answer LM via VM. No name or pt's voice was no VM. LM with limited information stating that if her symptoms increased or noticed bleeding go to the ER.

## 2019-11-03 NOTE — Telephone Encounter (Signed)
Pt called in and stated she is having some cramping abdominal. The pt is requesting a call from the nurse. The pt stated it was very bad cramping. Please advise

## 2019-11-06 MED ORDER — ACETAMINOPHEN-CODEINE #3 300-30 MG PO TABS: 1.0000 | ORAL_TABLET | Freq: Four times a day (QID) | ORAL | 0 refills | Status: DC | PRN

## 2019-11-06 NOTE — Telephone Encounter (Signed)
Received a mychart message from pt and contacted her to speak more about her request for Tylenol 3. Pt stated that regardless of what she do she is having a lot of pain in her left side and is unable to work and the regular strength Tylenol is not easing the pain.

## 2019-11-07 ENCOUNTER — Ambulatory Visit: Payer: Medicaid Other

## 2019-11-13 ENCOUNTER — Telehealth: Payer: Self-pay | Admitting: Obstetrics and Gynecology

## 2019-11-13 NOTE — Telephone Encounter (Signed)
Pt called in and stated that she has a cough she doesn't know what over the counter meds that she can take. She is going to she her PCP today at 54. Pt is requesting a call back. Please advise  Sent to jw, fh is off

## 2019-11-14 NOTE — Telephone Encounter (Signed)
LM for patient to return call.

## 2019-11-23 NOTE — Telephone Encounter (Signed)
LM to return call.

## 2019-11-28 ENCOUNTER — Other Ambulatory Visit: Payer: Self-pay

## 2019-11-28 ENCOUNTER — Encounter: Payer: Self-pay | Admitting: Obstetrics and Gynecology

## 2019-11-28 ENCOUNTER — Ambulatory Visit (INDEPENDENT_AMBULATORY_CARE_PROVIDER_SITE_OTHER): Payer: Medicaid Other | Admitting: Obstetrics and Gynecology

## 2019-11-28 ENCOUNTER — Other Ambulatory Visit: Payer: Medicaid Other

## 2019-11-28 VITALS — BP 123/77 | HR 94 | Wt 199.0 lb

## 2019-11-28 DIAGNOSIS — Z3A27 27 weeks gestation of pregnancy: Secondary | ICD-10-CM

## 2019-11-28 DIAGNOSIS — O2612 Low weight gain in pregnancy, second trimester: Secondary | ICD-10-CM

## 2019-11-28 DIAGNOSIS — Z23 Encounter for immunization: Secondary | ICD-10-CM

## 2019-11-28 DIAGNOSIS — O09523 Supervision of elderly multigravida, third trimester: Secondary | ICD-10-CM | POA: Diagnosis not present

## 2019-11-28 DIAGNOSIS — Z111 Encounter for screening for respiratory tuberculosis: Secondary | ICD-10-CM

## 2019-11-28 DIAGNOSIS — O10919 Unspecified pre-existing hypertension complicating pregnancy, unspecified trimester: Secondary | ICD-10-CM

## 2019-11-28 DIAGNOSIS — O99612 Diseases of the digestive system complicating pregnancy, second trimester: Secondary | ICD-10-CM

## 2019-11-28 DIAGNOSIS — K219 Gastro-esophageal reflux disease without esophagitis: Secondary | ICD-10-CM

## 2019-11-28 LAB — POCT URINALYSIS DIPSTICK OB
Bilirubin, UA: NEGATIVE
Blood, UA: NEGATIVE
Glucose, UA: NEGATIVE
Ketones, UA: NEGATIVE
Leukocytes, UA: NEGATIVE
Nitrite, UA: NEGATIVE
POC,PROTEIN,UA: NEGATIVE
Spec Grav, UA: 1.02 (ref 1.010–1.025)
Urobilinogen, UA: 0.2 E.U./dL
pH, UA: 7 (ref 5.0–8.0)

## 2019-11-28 MED ORDER — VITAFOL-NANO 18-0.6-0.4 MG PO TABS: 0.4000 mg | ORAL_TABLET | Freq: Every day | ORAL | 11 refills | Status: DC

## 2019-11-28 MED ORDER — CITRANATAL ASSURE 35-1 & 300 MG PO MISC: 1.0000 | Freq: Every day | ORAL | 12 refills | Status: DC

## 2019-11-28 MED ORDER — PANTOPRAZOLE SODIUM 20 MG PO TBEC: 20.0000 mg | DELAYED_RELEASE_TABLET | Freq: Every day | ORAL | 1 refills | Status: DC

## 2019-11-28 MED ORDER — TETANUS-DIPHTH-ACELL PERTUSSIS 5-2.5-18.5 LF-MCG/0.5 IM SUSP
0.5000 mL | Freq: Once | INTRAMUSCULAR | Status: AC
  Administered 2019-11-28: 0.5 mL via INTRAMUSCULAR

## 2019-11-28 NOTE — Progress Notes (Signed)
ROB-pt present for routine prenatal care and 28 weeks labs,tdap and BTC signed. Pt stated that she was having a lot of acid reflux. Pt requested a form for [facility] for whole milk and boost.

## 2019-11-28 NOTE — Addendum Note (Signed)
Addended by: Edwyna Shell on: 11/28/2019 12:34 PM   Modules accepted: Orders

## 2019-11-28 NOTE — Addendum Note (Signed)
Addended by: Edwyna Shell on: 11/28/2019 04:44 PM   Modules accepted: Orders

## 2019-11-28 NOTE — Progress Notes (Signed)
Patient complains of more acid reflux.  Notes her mother gave her some Omeprazole which helped some, takes occasionally.  Will prescribe Protonix. Also requesting a prescription for [facility] for whole milk/Boost supplementation due to poor weight gain. Review of chart reports TWG of 1 lb (patient lost 10 lbs in early part of pregnancy, has regained back to baseline). Will fax order. FH still in concordance with GA, given reassurance.  Is For 28 week labs today.  Desires to bottle feed (unsure if she will consider breastfeeding), desires Depo Provera for contraception. For Tdap today, signed blood consent.     The following were addressed during this visit:  Breastfeeding Education - The importance of early skin-to-skin contact    Comments: Keeps baby warm and secure, helps keep baby's blood sugar up and breathing steady, easier to bond and breastfeed, and helps calm baby.  - Effective positioning and attachment    Comments: Helps my baby to get enough breast milk, helps to produce an adequate milk supply, and helps prevent nipple pain and damage

## 2019-11-28 NOTE — Patient Instructions (Signed)
https://www.cdc.gov/vaccines/hcp/vis/vis-statements/tdap.pdf">  Tdap (Tetanus, Diphtheria, Pertussis) Vaccine: What You Need to Know 1. Why get vaccinated? Tdap vaccine can prevent tetanus, diphtheria, and pertussis. Diphtheria and pertussis spread from person to person. Tetanus enters the body through cuts or wounds.  TETANUS (T) causes painful stiffening of the muscles. Tetanus can lead to serious health problems, including being unable to open the mouth, having trouble swallowing and breathing, or death.  DIPHTHERIA (D) can lead to difficulty breathing, heart failure, paralysis, or death.  PERTUSSIS (aP), also known as "whooping cough," can cause uncontrollable, violent coughing which makes it hard to breathe, eat, or drink. Pertussis can be extremely serious in babies and young children, causing pneumonia, convulsions, brain damage, or death. In teens and adults, it can cause weight loss, loss of bladder control, passing out, and rib fractures from severe coughing. 2. Tdap vaccine Tdap is only for children 7 years and older, adolescents, and adults.  Adolescents should receive a single dose of Tdap, preferably at age 53 or 35 years. Pregnant women should get a dose of Tdap during every pregnancy, to protect the newborn from pertussis. Infants are most at risk for severe, life-threatening complications from pertussis. Adults who have never received Tdap should get a dose of Tdap. Also, adults should receive a booster dose every 10 years, or earlier in the case of a severe and dirty wound or burn. Booster doses can be either Tdap or Td (a different vaccine that protects against tetanus and diphtheria but not pertussis). Tdap may be given at the same time as other vaccines. 3. Talk with your health care provider Tell your vaccine provider if the person getting the vaccine:  Has had an allergic reaction after a previous dose of any vaccine that protects against tetanus, diphtheria, or pertussis,  or has any severe, life-threatening allergies.  Has had a coma, decreased level of consciousness, or prolonged seizures within 7 days after a previous dose of any pertussis vaccine (DTP, DTaP, or Tdap).  Has seizures or another nervous system problem.  Has ever had Guillain-Barr Syndrome (also called GBS).  Has had severe pain or swelling after a previous dose of any vaccine that protects against tetanus or diphtheria. In some cases, your health care provider may decide to postpone Tdap vaccination to a future visit.  People with minor illnesses, such as a cold, may be vaccinated. People who are moderately or severely ill should usually wait until they recover before getting Tdap vaccine.  Your health care provider can give you more information. 4. Risks of a vaccine reaction  Pain, redness, or swelling where the shot was given, mild fever, headache, feeling tired, and nausea, vomiting, diarrhea, or stomachache sometimes happen after Tdap vaccine. People sometimes faint after medical procedures, including vaccination. Tell your provider if you feel dizzy or have vision changes or ringing in the ears.  As with any medicine, there is a very remote chance of a vaccine causing a severe allergic reaction, other serious injury, or death. 5. What if there is a serious problem? An allergic reaction could occur after the vaccinated person leaves the clinic. If you see signs of a severe allergic reaction (hives, swelling of the face and throat, difficulty breathing, a fast heartbeat, dizziness, or weakness), call 9-1-1 and get the person to the nearest hospital. For other signs that concern you, call your health care provider.  Adverse reactions should be reported to the Vaccine Adverse Event Reporting System (VAERS). Your health care provider will usually file this report,  or you can do it yourself. Visit the VAERS website at www.vaers.hhs.gov or call 1-800-822-7967. VAERS is only for reporting  reactions, and VAERS staff do not give medical advice. 6. The National Vaccine Injury Compensation Program The National Vaccine Injury Compensation Program (VICP) is a federal program that was created to compensate people who may have been injured by certain vaccines. Visit the VICP website at www.hrsa.gov/vaccinecompensation or call 1-800-338-2382 to learn about the program and about filing a claim. There is a time limit to file a claim for compensation. 7. How can I learn more?  Ask your health care provider.  Call your local or state health department.  Contact the Centers for Disease Control and Prevention (CDC): ? Call 1-800-232-4636 (1-800-CDC-INFO) or ? Visit CDC's website at www.cdc.gov/vaccines Vaccine Information Statement Tdap (Tetanus, Diphtheria, Pertussis) Vaccine (12/21/2018) This information is not intended to replace advice given to you by your health care provider. Make sure you discuss any questions you have with your health care provider. Document Revised: 12/30/2018 Document Reviewed: 01/02/2019 Elsevier Patient Education  2020 [employer]. Third Trimester of Pregnancy  The third trimester is from week 28 through week 40 (months 7 through 9). This trimester is when your unborn baby (fetus) is growing very fast. At the end of the ninth month, the unborn baby is about 20 inches in length. It weighs about 6-10 pounds. Follow these instructions at home: Medicines  Take over-the-counter and prescription medicines only as told by your doctor. Some medicines are safe and some medicines are not safe during pregnancy.  Take a prenatal vitamin that contains at least 600 micrograms (mcg) of folic acid.  If you have trouble pooping (constipation), take medicine that will make your stool soft (stool softener) if your doctor approves. Eating and drinking   Eat regular, healthy meals.  Avoid raw meat and uncooked cheese.  If you get low calcium from the food you eat, talk to  your doctor about taking a daily calcium supplement.  Eat four or five small meals rather than three large meals a day.  Avoid foods that are high in fat and sugars, such as fried and sweet foods.  To prevent constipation: ? Eat foods that are high in fiber, like fresh fruits and vegetables, whole grains, and beans. ? Drink enough fluids to keep your pee (urine) clear or pale yellow. Activity  Exercise only as told by your doctor. Stop exercising if you start to have cramps.  Avoid heavy lifting, wear low heels, and sit up straight.  Do not exercise if it is too hot, too humid, or if you are in a place of great height (high altitude).  You may continue to have sex unless your doctor tells you not to. Relieving pain and discomfort  Wear a good support bra if your breasts are tender.  Take frequent breaks and rest with your legs raised if you have leg cramps or low back pain.  Take warm water baths (sitz baths) to soothe pain or discomfort caused by hemorrhoids. Use hemorrhoid cream if your doctor approves.  If you develop puffy, bulging veins (varicose veins) in your legs: ? Wear support hose or compression stockings as told by your doctor. ? Raise (elevate) your feet for 15 minutes, 3-4 times a day. ? Limit salt in your food. Safety  Wear your seat belt when driving.  Make a list of emergency phone numbers, including numbers for family, friends, the hospital, and police and fire departments. Preparing for your baby's arrival   To prepare for the arrival of your baby:  Take prenatal classes.  Practice driving to the hospital.  Visit the hospital and tour the maternity area.  Talk to your work about taking leave once the baby comes.  Pack your hospital bag.  Prepare the baby's room.  Go to your doctor visits.  Buy a rear-facing car seat. Learn how to install it in your car. General instructions  Do not use hot tubs, steam rooms, or saunas.  Do not use any products  that contain nicotine or tobacco, such as cigarettes and e-cigarettes. If you need help quitting, ask your doctor.  Do not drink alcohol.  Do not douche or use tampons or scented sanitary pads.  Do not cross your legs for long periods of time.  Do not travel for long distances unless you must. Only do so if your doctor says it is okay.  Visit your dentist if you have not gone during your pregnancy. Use a soft toothbrush to brush your teeth. Be gentle when you floss.  Avoid cat litter boxes and soil used by cats. These carry germs that can cause birth defects in the baby and can cause a loss of your baby (miscarriage) or stillbirth.  Keep all your prenatal visits as told by your doctor. This is important. Contact a doctor if:  You are not sure if you are in labor or if your water has broken.  You are dizzy.  You have mild cramps or pressure in your lower belly.  You have a nagging pain in your belly area.  You continue to feel sick to your stomach, you throw up, or you have watery poop.  You have bad smelling fluid coming from your vagina.  You have pain when you pee. Get help right away if:  You have a fever.  You are leaking fluid from your vagina.  You are spotting or bleeding from your vagina.  You have severe belly cramps or pain.  You lose or gain weight quickly.  You have trouble catching your breath and have chest pain.  You notice sudden or extreme puffiness (swelling) of your face, hands, ankles, feet, or legs.  You have not felt the baby move in over an hour.  You have severe headaches that do not go away with medicine.  You have trouble seeing.  You are leaking, or you are having a gush of fluid, from your vagina before you are 37 weeks.  You have regular belly spasms (contractions) before you are 37 weeks. Summary  The third trimester is from week 28 through week 40 (months 7 through 9). This time is when your unborn baby is growing very  fast.  Follow your doctor's advice about medicine, food, and activity.  Get ready for the arrival of your baby by taking prenatal classes, getting all the baby items ready, preparing the baby's room, and visiting your doctor to be checked.  Get help right away if you are bleeding from your vagina, or you have chest pain and trouble catching your breath, or if you have not felt your baby move in over an hour. This information is not intended to replace advice given to you by your health care provider. Make sure you discuss any questions you have with your health care provider. Document Revised: 12/29/2018 Document Reviewed: 10/13/2016 Elsevier Patient Education  [place].

## 2019-11-29 LAB — CBC
Hematocrit: 33.6 % — ABNORMAL LOW (ref 34.0–46.6)
Hemoglobin: 11.1 g/dL (ref 11.1–15.9)
MCH: 30.7 pg (ref 26.6–33.0)
MCHC: 33 g/dL (ref 31.5–35.7)
MCV: 93 fL (ref 79–97)
Platelets: 187 10*3/uL (ref 150–450)
RBC: 3.61 x10E6/uL — ABNORMAL LOW (ref 3.77–5.28)
RDW: 12 % (ref 11.7–15.4)
WBC: 7.4 10*3/uL (ref 3.4–10.8)

## 2019-11-29 LAB — RPR: RPR Ser Ql: NONREACTIVE

## 2019-11-29 LAB — GLUCOSE, 1 HOUR GESTATIONAL: Gestational Diabetes Screen: 162 mg/dL — ABNORMAL HIGH (ref 65–139)

## 2019-11-30 NOTE — Addendum Note (Signed)
Addended by: Edwyna Shell on: 11/30/2019 12:27 PM   Modules accepted: Orders

## 2019-12-01 ENCOUNTER — Other Ambulatory Visit: Payer: Self-pay

## 2019-12-04 ENCOUNTER — Other Ambulatory Visit: Payer: Medicaid Other

## 2019-12-04 ENCOUNTER — Other Ambulatory Visit: Payer: Self-pay

## 2019-12-04 DIAGNOSIS — O09523 Supervision of elderly multigravida, third trimester: Secondary | ICD-10-CM

## 2019-12-06 LAB — QUANTIFERON-TB GOLD PLUS
QuantiFERON Mitogen Value: >10 IU/mL
QuantiFERON Nil Value: 0.04 IU/mL
QuantiFERON TB1 Ag Value: 0.05 IU/mL
QuantiFERON TB2 Ag Value: 0.04 IU/mL
QuantiFERON-TB Gold Plus: NEGATIVE

## 2019-12-06 LAB — GESTATIONAL GLUCOSE TOLERANCE
Glucose, Fasting: 96 mg/dL — ABNORMAL HIGH (ref 65–94)
Glucose, GTT - 1 Hour: 220 mg/dL — ABNORMAL HIGH (ref 65–179)
Glucose, GTT - 2 Hour: 142 mg/dL (ref 65–154)
Glucose, GTT - 3 Hour: 52 mg/dL — ABNORMAL LOW (ref 65–139)

## 2019-12-11 ENCOUNTER — Other Ambulatory Visit: Payer: Self-pay

## 2019-12-11 DIAGNOSIS — O24419 Gestational diabetes mellitus in pregnancy, unspecified control: Secondary | ICD-10-CM

## 2019-12-12 ENCOUNTER — Encounter: Payer: Self-pay | Admitting: Obstetrics and Gynecology

## 2019-12-12 ENCOUNTER — Telehealth: Payer: Self-pay | Admitting: Obstetrics and Gynecology

## 2019-12-12 ENCOUNTER — Ambulatory Visit (INDEPENDENT_AMBULATORY_CARE_PROVIDER_SITE_OTHER): Payer: Medicaid Other | Admitting: Obstetrics and Gynecology

## 2019-12-12 ENCOUNTER — Other Ambulatory Visit: Payer: Self-pay

## 2019-12-12 VITALS — BP 136/77 | HR 109 | Wt 200.0 lb

## 2019-12-12 DIAGNOSIS — Z3A29 29 weeks gestation of pregnancy: Secondary | ICD-10-CM

## 2019-12-12 DIAGNOSIS — O24410 Gestational diabetes mellitus in pregnancy, diet controlled: Secondary | ICD-10-CM

## 2019-12-12 DIAGNOSIS — O09523 Supervision of elderly multigravida, third trimester: Secondary | ICD-10-CM

## 2019-12-12 DIAGNOSIS — N898 Other specified noninflammatory disorders of vagina: Secondary | ICD-10-CM

## 2019-12-12 DIAGNOSIS — O10919 Unspecified pre-existing hypertension complicating pregnancy, unspecified trimester: Secondary | ICD-10-CM

## 2019-12-12 LAB — POCT URINALYSIS DIPSTICK OB
Bilirubin, UA: NEGATIVE
Blood, UA: NEGATIVE
Glucose, UA: NEGATIVE
Ketones, UA: NEGATIVE
Leukocytes, UA: NEGATIVE
Nitrite, UA: NEGATIVE
Spec Grav, UA: >=1.03 — AB (ref 1.010–1.025)
Urobilinogen, UA: 0.2 E.U./dL
pH, UA: 6 (ref 5.0–8.0)

## 2019-12-12 MED ORDER — ONETOUCH ULTRASOFT LANCETS MISC: 6 refills | Status: DC

## 2019-12-12 MED ORDER — GLUCOSE BLOOD VI STRP: 1.0000 | ORAL_STRIP | Freq: Four times a day (QID) | 6 refills | Status: DC

## 2019-12-12 NOTE — Patient Instructions (Addendum)
Gestational Diabetes Mellitus, Diagnosis Gestational diabetes (gestational diabetes mellitus) is a temporary form of diabetes that some women develop during pregnancy. It usually occurs around weeks 24-28 of pregnancy, and it goes away after delivery. Hormonal changes during pregnancy can interfere with insulin production and function, which may result in one or both of these problems:  The pancreas does not make enough of a hormone called insulin.  Cells in the body do not respond properly to insulin that the body makes (insulin resistance). Normally, insulin allows blood sugar (glucose) to enter cells in the body. The cells use glucose for energy. Insulin resistance or lack of insulin causes excess glucose to build up in the blood instead of going into cells. As a result, high blood glucose (hyperglycemia) develops. If gestational diabetes is treated, it is not likely to cause problems. If it is not controlled with treatment, it may cause problems during labor and delivery, and some of those problems can be harmful to the unborn baby (fetus) and the mother. Women who get gestational diabetes are more likely to develop it if they get pregnant again, and they are more likely to develop type 2 diabetes in the future. What increases the risk? This condition may be more likely to develop in pregnant women who:  Are older than age 71 during pregnancy.  Have a family history of diabetes.  Are overweight.  Had gestational diabetes in the past.  Have polycystic ovary syndrome (PCOS).  Are pregnant with twins or multiples.  Are of American-Indian, African-American, Hispanic/Latino, or Asian/Pacific Islander descent. What are the signs or symptoms? Most women do not notice symptoms of gestational diabetes because the symptoms are similar to normal symptoms of pregnancy. Symptoms of gestational diabetes may include:  Increased thirst (polydipsia).  Increased hunger(polyphagia).  Increased  urination (polyuria). How is this diagnosed? This condition may be diagnosed based on your blood glucose level, which may be checked with one or more of the following blood tests:  A fasting blood glucose (FBG) test. You will not be allowed to eat (you will fast) for 8 hours or longer before a blood sample is taken.  A random blood glucose test. This checks your blood glucose at any time of day regardless of when you ate.  An oral glucose tolerance test (OGTT). This is usually done during weeks 24-28 of pregnancy. ? For this test, you will have an FBG test done. Then, you will drink a beverage that contains glucose. Your blood glucose will be tested again one hour after you drink the glucose beverage (1-hour OGTT). ? If the 1-hour OGTT result is at or above 140 mg/dL (7.8 mmol/L), you will repeat the OGTT. This time, your blood glucose will be tested 3 hours after you drink the glucose beverage (3-hour OGTT). If you have risk factors, you may be screened for undiagnosed type 2 diabetes at your first health care visit during your pregnancy (prenatal visit). How is this treated?     Your treatment may be managed by a specialist called an endocrinologist. This condition is treated by following instructions from your health care provider about:  Eating a healthy diet and getting more physical activity. These changes are the most important ways to manage gestational diabetes.  Checking your blood glucose. Do this as often as told.  Taking diabetes medicines or insulin every day. These will only be prescribed if they are needed. ? If you use insulin, you may need to adjust your dosage based on how physically active  you are and what foods you eat. Your health care provider will tell you how to do this. Your health care provider will set treatment goals for you based on the stage of your pregnancy and any other medical conditions you have. Generally, the goal of treatment is to maintain the following  blood glucose levels during pregnancy:  Before meals (preprandial): at or below 95 mg/dL (5.3 mmol/L).  After meals (postprandial): ? One hour after a meal: at or below 140 mg/dL (7.8 mmol/L). ? Two hours after a meal: at or below 120 mg/dL (6.7 mmol/L).  A1c (hemoglobin A1c) level: 6-6.5%. Follow these instructions at home: Questions to ask your health care provider  Consider asking the following questions: ? Do I need to meet with a diabetes educator? ? What equipment will I need to manage my diabetes at home? ? What diabetes medicines do I need, and when should I take them? ? How often do I need to check my blood glucose? ? What number can I call if I have questions? ? When is my next appointment? General instructions  Take over-the-counter and prescription medicines only as told by your health care provider.  Manage your weight gain during pregnancy. The amount of weight that you are expected to gain depends on your pre-pregnancy BMI (body mass index).  Keep all follow-up visits as told by your health care provider. This is important.  For more information about diabetes, visit: ? American Diabetes Association (ADA): www.diabetes.org ? American Association of Diabetes Educators (AADE): www.diabeteseducator.org Contact a health care provider if:  Your blood glucose level is at or above 240 mg/dL (13.3 mmol/L).  Your blood glucose level is at or above 200 mg/dL (11.1 mmol/L) and you have ketones in your urine.  You have been sick or have had a fever for 2 days or longer and you are not getting better.  You have any of the following problems for more than 6 hours: ? You cannot eat or drink. ? You have nausea and vomiting. ? You have diarrhea. Get help right away if:  Your blood glucose is lower than 54 mg/dL (3 mmol/L).  You become confused or you have trouble thinking clearly.  You have difficulty breathing.  You have moderate or large ketone levels in your  urine.  Your baby is moving around less than usual.  You develop unusual discharge or bleeding from your vagina.  You start having contractions early (prematurely). Contractions may feel like a tightening in your lower abdomen. Summary  Gestational diabetes (gestational diabetes mellitus) is a temporary form of diabetes that some women develop during pregnancy. It usually occurs around weeks 24-28 of pregnancy, and it goes away after delivery.  This condition is treated by making diet and lifestyle changes and taking diabetes medicines or insulin, if needed.  Women who get gestational diabetes are more likely to develop it if they get pregnant again, and they are more likely to develop type 2 diabetes in the future. This information is not intended to replace advice given to you by your health care provider. Make sure you discuss any questions you have with your health care provider. Document Revised: 10/14/2017 Document Reviewed: 10/11/2015 Elsevier Patient Education  [place].    Sinusitis, Adult Sinusitis is soreness and swelling (inflammation) of your sinuses. Sinuses are hollow spaces in the bones around your face. They are located:  Around your eyes.  In the middle of your forehead.  Behind your nose.  In your cheekbones.  Your sinuses and nasal passages are lined with a fluid called mucus. Mucus drains out of your sinuses. Swelling can trap mucus in your sinuses. This lets germs (bacteria, virus, or fungus) grow, which leads to infection. Most of the time, this condition is caused by a virus. What are the causes? This condition is caused by:  Allergies.  Asthma.  Germs.  Things that block your nose or sinuses.  Growths in the nose (nasal polyps).  Chemicals or irritants in the air.  Fungus (rare). What increases the risk? You are more likely to develop this condition if:  You have a weak body defense system (immune system).  You do a lot of swimming  or diving.  You use nasal sprays too much.  You smoke. What are the signs or symptoms? The main symptoms of this condition are pain and a feeling of pressure around the sinuses. Other symptoms include:  Stuffy nose (congestion).  Runny nose (drainage).  Swelling and warmth in the sinuses.  Headache.  Toothache.  A cough that may get worse at night.  Mucus that collects in the throat or the back of the nose (postnasal drip).  Being unable to smell and taste.  Being very tired (fatigue).  A fever.  Sore throat.  Bad breath. How is this diagnosed? This condition is diagnosed based on:  Your symptoms.  Your medical history.  A physical exam.  Tests to find out if your condition is short-term (acute) or long-term (chronic). Your doctor may: ? Check your nose for growths (polyps). ? Check your sinuses using a tool that has a light (endoscope). ? Check for allergies or germs. ? Do imaging tests, such as an MRI or CT scan. How is this treated? Treatment for this condition depends on the cause and whether it is short-term or long-term.  If caused by a virus, your symptoms should go away on their own within 10 days. You may be given medicines to relieve symptoms. They include: ? Medicines that shrink swollen tissue in the nose. ? Medicines that treat allergies (antihistamines). ? A spray that treats swelling of the nostrils. ? Rinses that help get rid of thick mucus in your nose (nasal saline washes).  If caused by bacteria, your doctor may wait to see if you will get better without treatment. You may be given antibiotic medicine if you have: ? A very bad infection. ? A weak body defense system.  If caused by growths in the nose, you may need to have surgery. Follow these instructions at home: Medicines  Take, use, or apply over-the-counter and prescription medicines only as told by your doctor. These may include nasal sprays.  If you were prescribed an  antibiotic medicine, take it as told by your doctor. Do not stop taking the antibiotic even if you start to feel better. Hydrate and humidify   Drink enough water to keep your pee (urine) pale yellow.  Use a cool mist humidifier to keep the humidity level in your home above 50%.  Breathe in steam for 10-15 minutes, 3-4 times a day, or as told by your doctor. You can do this in the bathroom while a hot shower is running.  Try not to spend time in cool or dry air. Rest  Rest as much as you can.  Sleep with your head raised (elevated).  Make sure you get enough sleep each night. General instructions   Put a warm, moist washcloth on your face 3-4 times a day, or as  often as told by your doctor. This will help with discomfort.  Wash your hands often with soap and water. If there is no soap and water, use hand sanitizer.  Do not smoke. Avoid being around people who are smoking (secondhand smoke).  Keep all follow-up visits as told by your doctor. This is important. Contact a doctor if:  You have a fever.  Your symptoms get worse.  Your symptoms do not get better within 10 days. Get help right away if:  You have a very bad headache.  You cannot stop throwing up (vomiting).  You have very bad pain or swelling around your face or eyes.  You have trouble seeing.  You feel confused.  Your neck is stiff.  You have trouble breathing. Summary  Sinusitis is swelling of your sinuses. Sinuses are hollow spaces in the bones around your face.  This condition is caused by tissues in your nose that become inflamed or swollen. This traps germs. These can lead to infection.  If you were prescribed an antibiotic medicine, take it as told by your doctor. Do not stop taking it even if you start to feel better.  Keep all follow-up visits as told by your doctor. This is important. This information is not intended to replace advice given to you by your health care provider. Make sure  you discuss any questions you have with your health care provider. Document Revised: 02/07/2018 Document Reviewed: 02/07/2018 Elsevier Patient Education  [place].

## 2019-12-12 NOTE — Progress Notes (Signed)
ROB: Patient reports complaints of allergies/sinuses.  Took Benadryl twice but does not like to take medications. Newly diagnosed GDM, has not heard anything from [facility] referral. Desires to go ahead and get meter and strips.  Ordered today. To check blood sugars 4 x daily, given instructions and food diary log. Continuing to note a pasty vaginal discharge, no odor or itching. Offered Nuswab testing. Protein noted in urine today but blood pressures still wnl (however increasing since last visit).  Patient remains of BP meds. Continue to monitor as she may likely need to resume in third trimester. RTC in 2 weeks.

## 2019-12-12 NOTE — Progress Notes (Signed)
ROB-Pt present for routine prenatal care. Pt c/o lower abd/vaginal pressure. Pt requested her blood sugar to be checked level 142.

## 2019-12-12 NOTE — Telephone Encounter (Signed)
Patient's pharmacy called saying that they received the prescription for strips and lancets but not for a meter. The pharmacy was wanting a meter ordered for this patient.

## 2019-12-12 NOTE — Telephone Encounter (Signed)
Patient called in to follow up with her meter.

## 2019-12-13 ENCOUNTER — Telehealth: Payer: Self-pay

## 2019-12-13 MED ORDER — BLOOD GLUCOSE MONITORING SUPPL DEVI: 1.0000 | 0 refills | Status: DC

## 2019-12-13 NOTE — Telephone Encounter (Signed)
Pt is aware that her blood glucose meter prescription had been sent to her pharmacy.

## 2019-12-14 LAB — NUSWAB VAGINITIS (VG)
Candida albicans, NAA: NEGATIVE
Candida glabrata, NAA: NEGATIVE
Trich vag by NAA: NEGATIVE

## 2019-12-14 NOTE — Telephone Encounter (Signed)
Pt is aware that an order for her glucose meter has been sent to her pharmacy.

## 2019-12-19 ENCOUNTER — Telehealth: Payer: Self-pay | Admitting: Obstetrics and Gynecology

## 2019-12-19 NOTE — Telephone Encounter (Signed)
Patient called in saying she needed a prescription for [facility]? Told patient I would leave a message for the nurse to return her phone call or assist her with her question.

## 2019-12-21 ENCOUNTER — Ambulatory Visit: Payer: Medicaid Other | Admitting: *Deleted

## 2019-12-22 ENCOUNTER — Ambulatory Visit: Payer: Medicaid Other | Admitting: *Deleted

## 2019-12-27 ENCOUNTER — Ambulatory Visit: Payer: Medicaid Other | Admitting: *Deleted

## 2019-12-27 NOTE — Telephone Encounter (Signed)
Please see mychart message.

## 2019-12-28 ENCOUNTER — Other Ambulatory Visit: Payer: Self-pay

## 2019-12-28 ENCOUNTER — Ambulatory Visit (INDEPENDENT_AMBULATORY_CARE_PROVIDER_SITE_OTHER): Payer: Medicaid Other | Admitting: Obstetrics and Gynecology

## 2019-12-28 ENCOUNTER — Encounter: Payer: Self-pay | Admitting: Obstetrics and Gynecology

## 2019-12-28 VITALS — BP 115/71 | HR 98 | Wt 203.3 lb

## 2019-12-28 DIAGNOSIS — O10013 Pre-existing essential hypertension complicating pregnancy, third trimester: Secondary | ICD-10-CM

## 2019-12-28 DIAGNOSIS — O09523 Supervision of elderly multigravida, third trimester: Secondary | ICD-10-CM

## 2019-12-28 DIAGNOSIS — O24410 Gestational diabetes mellitus in pregnancy, diet controlled: Secondary | ICD-10-CM

## 2019-12-28 DIAGNOSIS — Z3A31 31 weeks gestation of pregnancy: Secondary | ICD-10-CM

## 2019-12-28 LAB — POCT URINALYSIS DIPSTICK OB
Bilirubin, UA: NEGATIVE
Blood, UA: NEGATIVE
Glucose, UA: NEGATIVE
Ketones, UA: NEGATIVE
Leukocytes, UA: NEGATIVE
Nitrite, UA: NEGATIVE
POC,PROTEIN,UA: NEGATIVE
Spec Grav, UA: 1.01 (ref 1.010–1.025)
Urobilinogen, UA: 0.2 E.U./dL
pH, UA: 7.5 (ref 5.0–8.0)

## 2019-12-28 NOTE — Patient Instructions (Signed)
Gestational Diabetes Mellitus, Diagnosis Gestational diabetes (gestational diabetes mellitus) is a temporary form of diabetes that some women develop during pregnancy. It usually occurs around weeks 24-28 of pregnancy, and it goes away after delivery. Hormonal changes during pregnancy can interfere with insulin production and function, which may result in one or both of these problems:  The pancreas does not make enough of a hormone called insulin.  Cells in the body do not respond properly to insulin that the body makes (insulin resistance). Normally, insulin allows blood sugar (glucose) to enter cells in the body. The cells use glucose for energy. Insulin resistance or lack of insulin causes excess glucose to build up in the blood instead of going into cells. As a result, high blood glucose (hyperglycemia) develops. If gestational diabetes is treated, it is not likely to cause problems. If it is not controlled with treatment, it may cause problems during labor and delivery, and some of those problems can be harmful to the unborn baby (fetus) and the mother. Women who get gestational diabetes are more likely to develop it if they get pregnant again, and they are more likely to develop type 2 diabetes in the future. What increases the risk? This condition may be more likely to develop in pregnant women who:  Are older than age 25 during pregnancy.  Have a family history of diabetes.  Are overweight.  Had gestational diabetes in the past.  Have polycystic ovary syndrome (PCOS).  Are pregnant with twins or multiples.  Are of American-Indian, African-American, Hispanic/Latino, or Asian/Pacific Islander descent. What are the signs or symptoms? Most women do not notice symptoms of gestational diabetes because the symptoms are similar to normal symptoms of pregnancy. Symptoms of gestational diabetes may include:  Increased thirst (polydipsia).  Increased hunger(polyphagia).  Increased  urination (polyuria). How is this diagnosed? This condition may be diagnosed based on your blood glucose level, which may be checked with one or more of the following blood tests:  A fasting blood glucose (FBG) test. You will not be allowed to eat (you will fast) for 8 hours or longer before a blood sample is taken.  A random blood glucose test. This checks your blood glucose at any time of day regardless of when you ate.  An oral glucose tolerance test (OGTT). This is usually done during weeks 24-28 of pregnancy. ? For this test, you will have an FBG test done. Then, you will drink a beverage that contains glucose. Your blood glucose will be tested again one hour after you drink the glucose beverage (1-hour OGTT). ? If the 1-hour OGTT result is at or above 140 mg/dL (7.8 mmol/L), you will repeat the OGTT. This time, your blood glucose will be tested 3 hours after you drink the glucose beverage (3-hour OGTT). If you have risk factors, you may be screened for undiagnosed type 2 diabetes at your first health care visit during your pregnancy (prenatal visit). How is this treated?     Your treatment may be managed by a specialist called an endocrinologist. This condition is treated by following instructions from your health care provider about:  Eating a healthy diet and getting more physical activity. These changes are the most important ways to manage gestational diabetes.  Checking your blood glucose. Do this as often as told.  Taking diabetes medicines or insulin every day. These will only be prescribed if they are needed. ? If you use insulin, you may need to adjust your dosage based on how physically active   you are and what foods you eat. Your health care provider will tell you how to do this. Your health care provider will set treatment goals for you based on the stage of your pregnancy and any other medical conditions you have. Generally, the goal of treatment is to maintain the following  blood glucose levels during pregnancy:  Before meals (preprandial): at or below 95 mg/dL (5.3 mmol/L).  After meals (postprandial): ? One hour after a meal: at or below 140 mg/dL (7.8 mmol/L). ? Two hours after a meal: at or below 120 mg/dL (6.7 mmol/L).  A1c (hemoglobin A1c) level: 6-6.5%. Follow these instructions at home: Questions to ask your health care provider  Consider asking the following questions: ? Do I need to meet with a diabetes educator? ? What equipment will I need to manage my diabetes at home? ? What diabetes medicines do I need, and when should I take them? ? How often do I need to check my blood glucose? ? What number can I call if I have questions? ? When is my next appointment? General instructions  Take over-the-counter and prescription medicines only as told by your health care provider.  Manage your weight gain during pregnancy. The amount of weight that you are expected to gain depends on your pre-pregnancy BMI (body mass index).  Keep all follow-up visits as told by your health care provider. This is important.  For more information about diabetes, visit: ? American Diabetes Association (ADA): www.diabetes.org ? American Association of Diabetes Educators (AADE): www.diabeteseducator.org Contact a health care provider if:  Your blood glucose level is at or above 240 mg/dL (13.3 mmol/L).  Your blood glucose level is at or above 200 mg/dL (11.1 mmol/L) and you have ketones in your urine.  You have been sick or have had a fever for 2 days or longer and you are not getting better.  You have any of the following problems for more than 6 hours: ? You cannot eat or drink. ? You have nausea and vomiting. ? You have diarrhea. Get help right away if:  Your blood glucose is lower than 54 mg/dL (3 mmol/L).  You become confused or you have trouble thinking clearly.  You have difficulty breathing.  You have moderate or large ketone levels in your  urine.  Your baby is moving around less than usual.  You develop unusual discharge or bleeding from your vagina.  You start having contractions early (prematurely). Contractions may feel like a tightening in your lower abdomen. Summary  Gestational diabetes (gestational diabetes mellitus) is a temporary form of diabetes that some women develop during pregnancy. It usually occurs around weeks 24-28 of pregnancy, and it goes away after delivery.  This condition is treated by making diet and lifestyle changes and taking diabetes medicines or insulin, if needed.  Women who get gestational diabetes are more likely to develop it if they get pregnant again, and they are more likely to develop type 2 diabetes in the future. This information is not intended to replace advice given to you by your health care provider. Make sure you discuss any questions you have with your health care provider. Document Revised: 10/14/2017 Document Reviewed: 10/11/2015 Elsevier Patient Education  2020 Elsevier Inc.  

## 2019-12-28 NOTE — Progress Notes (Signed)
ROB: Patient overall doing well, does note some abdominal tightness. Has been checking blood sugars, notes all are in normal range except occasional fasting above 95 (but less than 100). BPs still wnl (cHTN, self-discontinued meds several visits ago). Patient desires to set date for scheduled IOL so she can notify her job. Will scheduled for 02/20/2020. For growth scan and to begin weekly NSTs next visit.  RTC in 2 weeks.

## 2019-12-28 NOTE — Progress Notes (Signed)
ROB-Pt present for routine prenatal care. Pt stated that she was doing well no problems.  

## 2020-01-01 ENCOUNTER — Telehealth: Payer: Self-pay | Admitting: Obstetrics and Gynecology

## 2020-01-01 NOTE — Telephone Encounter (Signed)
Patient called in saying that she is in a lot of pain that is causing her to not be able to work. Patient is having braxton hicks that has been going on for 3 days. Patient would like a call back from nurse.

## 2020-01-03 NOTE — Telephone Encounter (Signed)
Spoke to Vanessa Franco concerning her pain. Vanessa Franco stated that she has swollen feet and legs. A lot of braxton hick contractions that has caused her to be out of work for 3 days. Vanessa Franco also stated being constipated and having mucus discharge in her BM. Vanessa Franco made an appointment with [facility] for 01/05/2020.

## 2020-01-04 ENCOUNTER — Observation Stay
Admission: EM | Admit: 2020-01-04 | Discharge: 2020-01-05 | Disposition: A | Discharge status: Home / Self Care | Payer: Medicaid Other | Attending: Obstetrics and Gynecology | Admitting: Obstetrics and Gynecology

## 2020-01-04 ENCOUNTER — Encounter: Payer: Self-pay | Admitting: Obstetrics and Gynecology

## 2020-01-04 ENCOUNTER — Other Ambulatory Visit: Payer: Self-pay

## 2020-01-04 DIAGNOSIS — O26893 Other specified pregnancy related conditions, third trimester: Secondary | ICD-10-CM | POA: Diagnosis not present

## 2020-01-04 DIAGNOSIS — R109 Unspecified abdominal pain: Secondary | ICD-10-CM | POA: Diagnosis not present

## 2020-01-04 DIAGNOSIS — Z3A32 32 weeks gestation of pregnancy: Secondary | ICD-10-CM | POA: Diagnosis not present

## 2020-01-04 LAB — URINALYSIS, ROUTINE W REFLEX MICROSCOPIC
Bilirubin Urine: NEGATIVE
Glucose, UA: NEGATIVE mg/dL
Hgb urine dipstick: NEGATIVE
Ketones, ur: NEGATIVE mg/dL
Leukocytes,Ua: NEGATIVE
Nitrite: NEGATIVE
Protein, ur: NEGATIVE mg/dL
Specific Gravity, Urine: 1.019 (ref 1.005–1.030)
pH: 6 (ref 5.0–8.0)

## 2020-01-04 LAB — WET PREP, GENITAL
Clue Cells Wet Prep HPF POC: NONE SEEN
Sperm: NONE SEEN
Trich, Wet Prep: NONE SEEN
Yeast Wet Prep HPF POC: NONE SEEN

## 2020-01-04 NOTE — OB Triage Note (Addendum)
Pt is 39 y.o. G4P1. Pt stated she has been having Braxton Hick contractions that radiated around the front of her lower abdomen, however around 6 pm she began to have some lower back pain that has increased over the last couple of hours. Pt stated that she has had no N/V, but has has about 2-3 watery stools in the last 24 hours. Pt stated that she has had some clear thick discharge, no bloody discharge reported. Pt is scheduled to see her provider 01/05/20.

## 2020-01-05 ENCOUNTER — Ambulatory Visit (INDEPENDENT_AMBULATORY_CARE_PROVIDER_SITE_OTHER): Payer: Medicaid Other | Admitting: Obstetrics and Gynecology

## 2020-01-05 ENCOUNTER — Encounter: Payer: Self-pay | Admitting: Obstetrics and Gynecology

## 2020-01-05 VITALS — BP 114/77 | HR 94 | Wt 199.5 lb

## 2020-01-05 DIAGNOSIS — R1084 Generalized abdominal pain: Secondary | ICD-10-CM

## 2020-01-05 DIAGNOSIS — O10013 Pre-existing essential hypertension complicating pregnancy, third trimester: Secondary | ICD-10-CM

## 2020-01-05 DIAGNOSIS — O09523 Supervision of elderly multigravida, third trimester: Secondary | ICD-10-CM | POA: Diagnosis not present

## 2020-01-05 DIAGNOSIS — O26893 Other specified pregnancy related conditions, third trimester: Secondary | ICD-10-CM | POA: Diagnosis not present

## 2020-01-05 LAB — POCT URINALYSIS DIPSTICK OB
Bilirubin, UA: NEGATIVE
Blood, UA: NEGATIVE
Glucose, UA: NEGATIVE
Ketones, UA: NEGATIVE
Leukocytes, UA: NEGATIVE
Nitrite, UA: NEGATIVE
Spec Grav, UA: 1.01 (ref 1.010–1.025)
Urobilinogen, UA: 0.2 E.U./dL
pH, UA: 6.5 (ref 5.0–8.0)

## 2020-01-05 MED ORDER — FAMOTIDINE IN NACL 20-0.9 MG/50ML-% IV SOLN: 20.0000 mg | Freq: Once | INTRAVENOUS | Status: DC

## 2020-01-05 MED ORDER — ACETAMINOPHEN 500 MG PO TABS: 1000.0000 mg | ORAL_TABLET | Freq: Four times a day (QID) | ORAL | Status: DC | PRN

## 2020-01-05 NOTE — Addendum Note (Signed)
Addended by: Durwin Glaze on: 01/05/2020 01:25 PM   Modules accepted: Orders

## 2020-01-05 NOTE — Progress Notes (Signed)
ROB: Patient seen in labor and delivery last night.  NST reactive.  Continues to complain of intermittent back pain.  Discussed use of Tylenol, increased hydration, heating pad.  Patient reports active fetal movement.  Patient desires a cervical exam.  Says she thinks she might have lost her mucous plug and wants to be checked.

## 2020-01-05 NOTE — Final Progress Note (Signed)
L&D OB Triage Note  Vanessa Franco is a 39 y.o. [id number] female at [redacted]w[redacted]d, EDD Estimated Date of Delivery: 02/25/20 who presented to triage for complaints of abdominal pain in pregnancy x 1 day.  She has not tried to take anything for the pain.  She was evaluated by the nurses with no significant findings for preterm labor or acute abdomen. Vital signs stable. An NST was performed and has been reviewed by MD. She was treated with Tylenol 1000 mg.   NST INTERPRETATION: Indications: rule out uterine contractions  Mode: External Baseline Rate (A): 135 bpm Variability: Moderate Accelerations: 15 x 15 Decelerations: None       Tocometer: no contractions   Impression: reactive   Plan: NST performed was reviewed and was found to be reactive. She was discharged home with bleeding/preterm labor precautions. Advised on continued use of Tylenol prn for pain. Continue routine prenatal care. Follow up with OB/GYN as previously scheduled.     [name], MD [facility]

## 2020-01-05 NOTE — Discharge Instructions (Signed)

## 2020-01-10 ENCOUNTER — Other Ambulatory Visit: Payer: Self-pay

## 2020-01-10 ENCOUNTER — Ambulatory Visit (INDEPENDENT_AMBULATORY_CARE_PROVIDER_SITE_OTHER): Payer: Medicaid Other | Admitting: Obstetrics and Gynecology

## 2020-01-10 ENCOUNTER — Encounter: Payer: Self-pay | Admitting: Obstetrics and Gynecology

## 2020-01-10 ENCOUNTER — Other Ambulatory Visit: Payer: Medicaid Other

## 2020-01-10 ENCOUNTER — Ambulatory Visit (INDEPENDENT_AMBULATORY_CARE_PROVIDER_SITE_OTHER): Payer: Medicaid Other

## 2020-01-10 VITALS — BP 111/74 | HR 91 | Wt 201.1 lb

## 2020-01-10 DIAGNOSIS — O09523 Supervision of elderly multigravida, third trimester: Secondary | ICD-10-CM | POA: Diagnosis not present

## 2020-01-10 DIAGNOSIS — O26893 Other specified pregnancy related conditions, third trimester: Secondary | ICD-10-CM

## 2020-01-10 DIAGNOSIS — Z3A33 33 weeks gestation of pregnancy: Secondary | ICD-10-CM

## 2020-01-10 DIAGNOSIS — O24410 Gestational diabetes mellitus in pregnancy, diet controlled: Secondary | ICD-10-CM

## 2020-01-10 DIAGNOSIS — O10013 Pre-existing essential hypertension complicating pregnancy, third trimester: Secondary | ICD-10-CM

## 2020-01-10 DIAGNOSIS — M25559 Pain in unspecified hip: Secondary | ICD-10-CM | POA: Insufficient documentation

## 2020-01-10 DIAGNOSIS — M5432 Sciatica, left side: Secondary | ICD-10-CM | POA: Insufficient documentation

## 2020-01-10 LAB — POCT URINALYSIS DIPSTICK OB
Bilirubin, UA: NEGATIVE
Blood, UA: NEGATIVE
Glucose, UA: NEGATIVE
Ketones, UA: NEGATIVE
Leukocytes, UA: NEGATIVE
Nitrite, UA: NEGATIVE
Spec Grav, UA: >=1.03 — AB (ref 1.010–1.025)
Urobilinogen, UA: 0.2 E.U./dL
pH, UA: 6 (ref 5.0–8.0)

## 2020-01-10 NOTE — Progress Notes (Signed)
ROB-Pt present for routine prenatal care. Pt c/o of hip pain and a lot of other pain and pressure.

## 2020-01-10 NOTE — Progress Notes (Signed)
ROB: Patient still noting hip pain. Notes that she took herself out of work due to constant discomfort and contractions. Recommended that she f/u with physical therapy again now that she has more time.  S/p normal growth scan, to repeat in 4 weeks. Blood sugars wnl. NST performed today was reviewed and was found to be reactive.  Continue recommended antenatal testing and prenatal care.     NONSTRESS TEST INTERPRETATION  INDICATIONS: Chronic hypertension (no meds),GDM (diet) and AMA  FHR baseline: 150 RESULTS:Reactive COMMENTS: Occasional uterine irritability   PLAN: 1. Continue fetal kick counts twice a day. 2. Continue antepartum testing as scheduled weekly

## 2020-01-18 ENCOUNTER — Ambulatory Visit: Payer: Medicaid Other

## 2020-01-18 ENCOUNTER — Other Ambulatory Visit: Payer: Medicaid Other

## 2020-01-25 ENCOUNTER — Ambulatory Visit (INDEPENDENT_AMBULATORY_CARE_PROVIDER_SITE_OTHER): Payer: Medicaid Other | Admitting: Certified Nurse Midwife

## 2020-01-25 ENCOUNTER — Other Ambulatory Visit: Payer: Self-pay

## 2020-01-25 ENCOUNTER — Other Ambulatory Visit: Payer: Medicaid Other

## 2020-01-25 VITALS — Wt 204.4 lb

## 2020-01-25 DIAGNOSIS — Z3493 Encounter for supervision of normal pregnancy, unspecified, third trimester: Secondary | ICD-10-CM | POA: Diagnosis not present

## 2020-01-25 DIAGNOSIS — O24410 Gestational diabetes mellitus in pregnancy, diet controlled: Secondary | ICD-10-CM | POA: Diagnosis not present

## 2020-01-25 DIAGNOSIS — O09523 Supervision of elderly multigravida, third trimester: Secondary | ICD-10-CM

## 2020-01-25 DIAGNOSIS — Z3A35 35 weeks gestation of pregnancy: Secondary | ICD-10-CM | POA: Diagnosis not present

## 2020-01-25 DIAGNOSIS — O10919 Unspecified pre-existing hypertension complicating pregnancy, unspecified trimester: Secondary | ICD-10-CM | POA: Diagnosis not present

## 2020-01-25 LAB — POCT URINALYSIS DIPSTICK OB
Bilirubin, UA: NEGATIVE
Blood, UA: NEGATIVE
Glucose, UA: NEGATIVE
Ketones, UA: NEGATIVE
Leukocytes, UA: NEGATIVE
Nitrite, UA: NEGATIVE
POC,PROTEIN,UA: NEGATIVE
Spec Grav, UA: 1.015 (ref 1.010–1.025)
Urobilinogen, UA: 0.2 U/dL
pH, UA: 5 (ref 5.0–8.0)

## 2020-01-25 MED ORDER — MAGNESIUM OXIDE 400 MG PO CAPS: 1.0000 | ORAL_CAPSULE | Freq: Two times a day (BID) | ORAL | 1 refills | Status: DC

## 2020-01-25 NOTE — Patient Instructions (Signed)
Fetal Movement Counts Patient Name: ________________________________________________ Patient Due Date: ____________________ What is a fetal movement count?  A fetal movement count is the number of times that you feel your baby move during a certain amount of time. This may also be called a fetal kick count. A fetal movement count is recommended for every pregnant woman. You may be asked to start counting fetal movements as early as week 28 of your pregnancy. Pay attention to when your baby is most active. You may notice your baby's sleep and wake cycles. You may also notice things that make your baby move more. You should do a fetal movement count:  When your baby is normally most active.  At the same time each day. A good time to count movements is while you are resting, after having something to eat and drink. How do I count fetal movements? 1. Find a quiet, comfortable area. Sit, or lie down on your side. 2. Write down the date, the start time and stop time, and the number of movements that you felt between those two times. Take this information with you to your health care visits. 3. Write down your start time when you feel the first movement. 4. Count kicks, flutters, swishes, rolls, and jabs. You should feel at least 10 movements. 5. You may stop counting after you have felt 10 movements, or if you have been counting for 2 hours. Write down the stop time. 6. If you do not feel 10 movements in 2 hours, contact your health care provider for further instructions. Your health care provider may want to do additional tests to assess your baby's well-being. Contact a health care provider if:  You feel fewer than 10 movements in 2 hours.  Your baby is not moving like he or she usually does. Date: ____________ Start time: ____________ Stop time: ____________ Movements: ____________ Date: ____________ Start time: ____________ Stop time: ____________ Movements: ____________ Date: ____________  Start time: ____________ Stop time: ____________ Movements: ____________ Date: ____________ Start time: ____________ Stop time: ____________ Movements: ____________ Date: ____________ Start time: ____________ Stop time: ____________ Movements: ____________ Date: ____________ Start time: ____________ Stop time: ____________ Movements: ____________ Date: ____________ Start time: ____________ Stop time: ____________ Movements: ____________ Date: ____________ Start time: ____________ Stop time: ____________ Movements: ____________ Date: ____________ Start time: ____________ Stop time: ____________ Movements: ____________ This information is not intended to replace advice given to you by your health care provider. Make sure you discuss any questions you have with your health care provider. Document Revised: 04/27/2019 Document Reviewed: 04/27/2019 Elsevier Patient Education  2020 Elsevier Inc.  

## 2020-01-25 NOTE — Progress Notes (Signed)
ROB-NST performed today was reviewed and was found to be reactive. Baseline 135 bpm with Moderate variability, accelerations present and no decelerations noted. Reports tension headaches for the last few days taking 500 mg PO Tylenol. Advised to increased dose and add Magnesium BID, Rx sent to pharmacy on file. Left blood sugar log at home, reports fasting levels 98-100 with levels less than 130 after meals. Reviewed red flag symptoms and when to call.  Continue recommended antenatal testing and prenatal care. RTC x 1 week for NST, 36 week cultures, and ROB. RTC x 2 weeks for NST, growth/AFI, and ROB or sooner if needed.

## 2020-01-31 ENCOUNTER — Other Ambulatory Visit: Payer: Self-pay

## 2020-01-31 ENCOUNTER — Other Ambulatory Visit: Payer: Medicaid Other

## 2020-01-31 ENCOUNTER — Ambulatory Visit (INDEPENDENT_AMBULATORY_CARE_PROVIDER_SITE_OTHER): Payer: Medicaid Other | Admitting: Obstetrics and Gynecology

## 2020-01-31 ENCOUNTER — Encounter: Payer: Self-pay | Admitting: Obstetrics and Gynecology

## 2020-01-31 VITALS — BP 129/84 | HR 97 | Wt 208.2 lb

## 2020-01-31 DIAGNOSIS — Z3A36 36 weeks gestation of pregnancy: Secondary | ICD-10-CM | POA: Diagnosis not present

## 2020-01-31 DIAGNOSIS — O24410 Gestational diabetes mellitus in pregnancy, diet controlled: Secondary | ICD-10-CM

## 2020-01-31 LAB — POCT URINALYSIS DIPSTICK OB
Bilirubin, UA: NEGATIVE
Blood, UA: NEGATIVE
Glucose, UA: NEGATIVE
Ketones, UA: NEGATIVE
Leukocytes, UA: NEGATIVE
Nitrite, UA: NEGATIVE
POC,PROTEIN,UA: NEGATIVE
Spec Grav, UA: 1.02 (ref 1.010–1.025)
Urobilinogen, UA: 0.2 E.U./dL
pH, UA: 6.5 (ref 5.0–8.0)

## 2020-01-31 NOTE — Progress Notes (Signed)
ROB: Patient notes back pain, possibly contractions in her back yesterday.  Also noting an increase in vaginal discharge thick, white, with no odor or associated itching.  Blood sugars stable. 36 week labs done today. Discussed labor precautions.  RTC in 1 week, for growth/BPP.

## 2020-01-31 NOTE — Patient Instructions (Signed)

## 2020-01-31 NOTE — Progress Notes (Signed)
ROB-Pt present for routine prenatal care, NST and 36 week labs. Pt c/o overall body pains and noticed a increase in discharge that is thick and white. No other problems.

## 2020-02-02 LAB — STREP GP B NAA: Strep Gp B NAA: NEGATIVE

## 2020-02-03 LAB — GC/CHLAMYDIA PROBE AMP
Chlamydia trachomatis, NAA: NEGATIVE
Neisseria Gonorrhoeae by PCR: NEGATIVE

## 2020-02-06 ENCOUNTER — Other Ambulatory Visit: Payer: Self-pay

## 2020-02-06 DIAGNOSIS — O24410 Gestational diabetes mellitus in pregnancy, diet controlled: Secondary | ICD-10-CM

## 2020-02-06 DIAGNOSIS — Z3A37 37 weeks gestation of pregnancy: Secondary | ICD-10-CM

## 2020-02-06 DIAGNOSIS — O10919 Unspecified pre-existing hypertension complicating pregnancy, unspecified trimester: Secondary | ICD-10-CM

## 2020-02-07 ENCOUNTER — Other Ambulatory Visit: Payer: Medicaid Other

## 2020-02-08 ENCOUNTER — Other Ambulatory Visit: Payer: Medicaid Other

## 2020-02-09 ENCOUNTER — Other Ambulatory Visit
Admission: RE | Admit: 2020-02-09 | Discharge: 2020-02-09 | Disposition: A | Discharge status: Home / Self Care | Payer: Medicaid Other | Source: Ambulatory Visit | Attending: Obstetrics and Gynecology | Admitting: Obstetrics and Gynecology

## 2020-02-09 ENCOUNTER — Other Ambulatory Visit: Payer: Medicaid Other

## 2020-02-09 ENCOUNTER — Other Ambulatory Visit: Payer: Self-pay

## 2020-02-09 ENCOUNTER — Ambulatory Visit (INDEPENDENT_AMBULATORY_CARE_PROVIDER_SITE_OTHER): Payer: Medicaid Other | Admitting: Obstetrics and Gynecology

## 2020-02-09 ENCOUNTER — Encounter: Payer: Self-pay | Admitting: Obstetrics and Gynecology

## 2020-02-09 VITALS — BP 133/86 | HR 92 | Wt 211.1 lb

## 2020-02-09 DIAGNOSIS — Z3A37 37 weeks gestation of pregnancy: Secondary | ICD-10-CM

## 2020-02-09 DIAGNOSIS — Z20822 Contact with and (suspected) exposure to covid-19: Secondary | ICD-10-CM | POA: Insufficient documentation

## 2020-02-09 DIAGNOSIS — Z01812 Encounter for preprocedural laboratory examination: Secondary | ICD-10-CM | POA: Diagnosis present

## 2020-02-09 DIAGNOSIS — O24410 Gestational diabetes mellitus in pregnancy, diet controlled: Secondary | ICD-10-CM

## 2020-02-09 DIAGNOSIS — O09523 Supervision of elderly multigravida, third trimester: Secondary | ICD-10-CM | POA: Diagnosis not present

## 2020-02-09 DIAGNOSIS — O10919 Unspecified pre-existing hypertension complicating pregnancy, unspecified trimester: Secondary | ICD-10-CM | POA: Diagnosis not present

## 2020-02-09 LAB — POCT URINALYSIS DIPSTICK OB
Bilirubin, UA: NEGATIVE
Blood, UA: NEGATIVE
Glucose, UA: NEGATIVE
Ketones, UA: NEGATIVE
Leukocytes, UA: NEGATIVE
Nitrite, UA: NEGATIVE
POC,PROTEIN,UA: NEGATIVE
Spec Grav, UA: 1.025 (ref 1.010–1.025)
Urobilinogen, UA: 0.2 E.U./dL
pH, UA: 6 (ref 5.0–8.0)

## 2020-02-09 NOTE — Progress Notes (Signed)
ROB-Pt present for routine prenatal care and NST.

## 2020-02-09 NOTE — Addendum Note (Signed)
Addended by: Augusto Gamble on: 02/09/2020 08:51 PM   Modules accepted: Orders, SmartSet

## 2020-02-09 NOTE — Patient Instructions (Signed)
COVID 19 Instructions for Scheduled Procedure (Inductions/C-sections and GYN surgeries)   Thank you for choosing [facility] for your services.  You have been scheduled for a procedure called _______Induction of Labor__________________.    Your procedure is scheduled on __________May 23, 2021___________________.  You are required to have COVID-19 testing performed 2 days prior to your scheduled procedure date.  Testing is performed between 9 AM and 1 PM Monday through Friday.  Please present for testing on _____May 21, 2021_______________ during this hour. Drive up testing is performed in front of the [place] (this is next to the [employer]).    Upon your scheduled procedure date, you will need to arrive at the [place] entrance. (There is a statue at the front of this entrance.)   Please arrive on time if you are scheduled for an induction of labor.   If you are scheduled for a Cesarean delivery or for Gyn Surgery, arrive 2 hours prior to your procedure time.   If you are an Obstetric patient and your arrival time falls between 11 PM and 6 AM call L&D [phone]) when you arrive.  A staff member will meet you at the [place] entrance.  At this time, patients are allowed 1 support person to accompany them. Face masks are required for you and your support person. Your support person is now allowed to be there with you during the entire time of your admission.   Please contact the office if you have any questions regarding this information.  The [facility] office number is [phone].     Thank you,    Your [facility] Providers        Labor Induction  Labor induction is when steps are taken to cause a pregnant woman to begin the labor process. Most women go into labor on their own between 37 weeks and 42 weeks of pregnancy. When this does not happen or when there is a medical need for labor to begin, steps may be taken to induce labor.  Labor induction causes a pregnant woman's uterus to contract. It also causes the cervix to soften (ripen), open (dilate), and thin out (efface). Usually, labor is not induced before 39 weeks of pregnancy unless there is a medical reason to do so. Your health care provider will determine if labor induction is needed. Before inducing labor, your health care provider will consider a number of factors, including:  Your medical condition and your baby's.  How many weeks along you are in your pregnancy.  How mature your baby's lungs are.  The condition of your cervix.  The position of your baby.  The size of your birth canal. What are some reasons for labor induction? Labor may be induced if:  Your health or your baby's health is at risk.  Your pregnancy is overdue by 1 week or more.  Your water breaks but labor does not start on its own.  There is a low amount of amniotic fluid around your baby. You may also choose (elect) to have labor induced at a certain time. Generally, elective labor induction is done no earlier than 39 weeks of pregnancy. What methods are used for labor induction? Methods used for labor induction include:  Prostaglandin medicine. This medicine starts contractions and causes the cervix to dilate and ripen. It can be taken by mouth (orally) or by being inserted into the vagina (suppository).  Inserting a small, thin tube (catheter) with a balloon into the  vagina and then expanding the balloon with water to dilate the cervix.  Stripping the membranes. In this method, your health care provider gently separates amniotic sac tissue from the cervix. This causes the cervix to stretch, which in turn causes the release of a hormone called progesterone. The hormone causes the uterus to contract. This procedure is often done during an office visit, after which you will be sent home to wait for contractions to begin.  Breaking the water. In this method, your health care  provider uses a small instrument to make a small hole in the amniotic sac. This eventually causes the amniotic sac to break. Contractions should begin after a few hours.  Medicine to trigger or strengthen contractions. This medicine is given through an IV that is inserted into a vein in your arm. Except for membrane stripping, which can be done in a clinic, labor induction is done in the hospital so that you and your baby can be carefully monitored. How long does it take for labor to be induced? The length of time it takes to induce labor depends on how ready your body is for labor. Some inductions can take up to 2-3 days, while others may take less than a day. Induction may take longer if:  You are induced early in your pregnancy.  It is your first pregnancy.  Your cervix is not ready. What are some risks associated with labor induction? Some risks associated with labor induction include:  Changes in fetal heart rate, such as being too high, too low, or irregular (erratic).  Failed induction.  Infection in the mother or the baby.  Increased risk of having a cesarean delivery.  Fetal death.  Breaking off (abruption) of the placenta from the uterus (rare).  Rupture of the uterus (very rare). When induction is needed for medical reasons, the benefits of induction generally outweigh the risks. What are some reasons for not inducing labor? Labor induction should not be done if:  Your baby does not tolerate contractions.  You have had previous surgeries on your uterus, such as a myomectomy, removal of fibroids, or a vertical scar from a previous cesarean delivery.  Your placenta lies very low in your uterus and blocks the opening of the cervix (placenta previa).  Your baby is not in a head-down position.  The umbilical cord drops down into the birth canal in front of the baby.  There are unusual circumstances, such as the baby being very early (premature).  You have had more  than 2 previous cesarean deliveries. Summary  Labor induction is when steps are taken to cause a pregnant woman to begin the labor process.  Labor induction causes a pregnant woman's uterus to contract. It also causes the cervix to ripen, dilate, and efface.  Labor is not induced before 39 weeks of pregnancy unless there is a medical reason to do so.  When induction is needed for medical reasons, the benefits of induction generally outweigh the risks. This information is not intended to replace advice given to you by your health care provider. Make sure you discuss any questions you have with your health care provider. Document Revised: 09/10/2017 Document Reviewed: 10/21/2016 Elsevier Patient Education  2020 [employer].

## 2020-02-09 NOTE — Progress Notes (Signed)
ROB: Patient complains of irregular contractions. Desires to discuss IOL. Will schedule for 38 weeks, this Sunday.  NST performed today was reviewed and was found to be reactive.  Continue recommended antenatal testing and prenatal care.    NONSTRESS TEST INTERPRETATION  INDICATIONS: Chronic hypertension, AMA, GDM (diet)  FHR baseline: 145 bpm RESULTS:Reactive COMMENTS: Uterine irritability   PLAN: 1. Continue fetal kick counts twice a day. 2. Scheduled for induction of labor in 2 days.

## 2020-02-10 LAB — SARS CORONAVIRUS 2 (TAT 6-24 HRS): SARS Coronavirus 2: NEGATIVE

## 2020-02-11 ENCOUNTER — Inpatient Hospital Stay
Admission: EM | Admit: 2020-02-11 | Discharge: 2020-02-12 | DRG: 806 | Disposition: A | Discharge status: Home / Self Care | Payer: Medicaid Other | Attending: Obstetrics and Gynecology | Admitting: Obstetrics and Gynecology

## 2020-02-11 ENCOUNTER — Inpatient Hospital Stay: Payer: Medicaid Other | Admitting: Anesthesiology

## 2020-02-11 ENCOUNTER — Other Ambulatory Visit: Payer: Self-pay

## 2020-02-11 ENCOUNTER — Encounter: Payer: Self-pay | Admitting: Obstetrics and Gynecology

## 2020-02-11 DIAGNOSIS — O99334 Smoking (tobacco) complicating childbirth: Secondary | ICD-10-CM | POA: Diagnosis present

## 2020-02-11 DIAGNOSIS — O09523 Supervision of elderly multigravida, third trimester: Secondary | ICD-10-CM | POA: Diagnosis not present

## 2020-02-11 DIAGNOSIS — O4292 Full-term premature rupture of membranes, unspecified as to length of time between rupture and onset of labor: Principal | ICD-10-CM | POA: Diagnosis present

## 2020-02-11 DIAGNOSIS — O09529 Supervision of elderly multigravida, unspecified trimester: Secondary | ICD-10-CM

## 2020-02-11 DIAGNOSIS — Z3A38 38 weeks gestation of pregnancy: Secondary | ICD-10-CM

## 2020-02-11 DIAGNOSIS — O10919 Unspecified pre-existing hypertension complicating pregnancy, unspecified trimester: Secondary | ICD-10-CM

## 2020-02-11 DIAGNOSIS — O1002 Pre-existing essential hypertension complicating childbirth: Secondary | ICD-10-CM | POA: Diagnosis present

## 2020-02-11 DIAGNOSIS — I10 Essential (primary) hypertension: Secondary | ICD-10-CM

## 2020-02-11 DIAGNOSIS — O24410 Gestational diabetes mellitus in pregnancy, diet controlled: Secondary | ICD-10-CM | POA: Diagnosis not present

## 2020-02-11 DIAGNOSIS — F17210 Nicotine dependence, cigarettes, uncomplicated: Secondary | ICD-10-CM | POA: Diagnosis present

## 2020-02-11 DIAGNOSIS — O24420 Gestational diabetes mellitus in childbirth, diet controlled: Secondary | ICD-10-CM

## 2020-02-11 LAB — COMPREHENSIVE METABOLIC PANEL
ALT: 7 U/L (ref 0–44)
AST: 15 U/L (ref 15–41)
Albumin: 3.1 g/dL — ABNORMAL LOW (ref 3.5–5.0)
Alkaline Phosphatase: 139 U/L — ABNORMAL HIGH (ref 38–126)
Anion gap: 7 (ref 5–15)
BUN: 11 mg/dL (ref 6–20)
CO2: 23 mmol/L (ref 22–32)
Calcium: 9 mg/dL (ref 8.9–10.3)
Chloride: 106 mmol/L (ref 98–111)
Creatinine, Ser: 0.49 mg/dL (ref 0.44–1.00)
GFR calc Af Amer: >60 mL/min (ref >60)
GFR calc non Af Amer: >60 mL/min (ref >60)
Glucose, Bld: 105 mg/dL — ABNORMAL HIGH (ref 70–99)
Potassium: 3.7 mmol/L (ref 3.5–5.1)
Sodium: 136 mmol/L (ref 135–145)
Total Bilirubin: 1 mg/dL (ref 0.3–1.2)
Total Protein: 6.6 g/dL (ref 6.5–8.1)

## 2020-02-11 LAB — PROTEIN / CREATININE RATIO, URINE
Creatinine, Urine: 118 mg/dL
Protein Creatinine Ratio: 0.13 mg/mg{Cre} (ref 0.00–0.15)
Total Protein, Urine: 15 mg/dL

## 2020-02-11 LAB — CBC
HCT: 36.8 % (ref 36.0–46.0)
Hemoglobin: 12.6 g/dL (ref 12.0–15.0)
MCH: 31.6 pg (ref 26.0–34.0)
MCHC: 34.2 g/dL (ref 30.0–36.0)
MCV: 92.2 fL (ref 80.0–100.0)
Platelets: 174 10*3/uL (ref 150–400)
RBC: 3.99 MIL/uL (ref 3.87–5.11)
RDW: 13 % (ref 11.5–15.5)
WBC: 9.6 10*3/uL (ref 4.0–10.5)
nRBC: 0 % (ref 0.0–0.2)

## 2020-02-11 LAB — GLUCOSE, CAPILLARY: Glucose-Capillary: 106 mg/dL — ABNORMAL HIGH (ref 70–99)

## 2020-02-11 LAB — TYPE AND SCREEN
ABO/RH(D): O POS
Antibody Screen: NEGATIVE

## 2020-02-11 MED ORDER — LABETALOL HCL 5 MG/ML IV SOLN: 80.0000 mg | INTRAVENOUS | Status: DC | PRN

## 2020-02-11 MED ORDER — MISOPROSTOL 200 MCG PO TABS
ORAL_TABLET | ORAL | Status: AC
  Filled 2020-02-11: qty 4

## 2020-02-11 MED ORDER — OXYCODONE HCL 5 MG PO TABS
ORAL_TABLET | ORAL | Status: AC
  Filled 2020-02-11: qty 1

## 2020-02-11 MED ORDER — ONDANSETRON HCL 4 MG/2ML IJ SOLN: 4.0000 mg | Freq: Four times a day (QID) | INTRAMUSCULAR | Status: DC | PRN

## 2020-02-11 MED ORDER — OXYTOCIN 10 UNIT/ML IJ SOLN
INTRAMUSCULAR | Status: AC
  Filled 2020-02-11: qty 2

## 2020-02-11 MED ORDER — IBUPROFEN 800 MG PO TABS
800.0000 mg | ORAL_TABLET | Freq: Four times a day (QID) | ORAL | Status: DC
  Administered 2020-02-11 – 2020-02-12 (×3): 800 mg via ORAL
  Filled 2020-02-11 (×2): qty 1

## 2020-02-11 MED ORDER — ZOLPIDEM TARTRATE 5 MG PO TABS: 5.0000 mg | ORAL_TABLET | Freq: Every evening | ORAL | Status: DC | PRN

## 2020-02-11 MED ORDER — PHENYLEPHRINE 40 MCG/ML (10ML) SYRINGE FOR IV PUSH (FOR BLOOD PRESSURE SUPPORT): 80.0000 ug | PREFILLED_SYRINGE | INTRAVENOUS | Status: DC | PRN

## 2020-02-11 MED ORDER — COCONUT OIL OIL: 1.0000 "application " | TOPICAL_OIL | Status: DC | PRN

## 2020-02-11 MED ORDER — FENTANYL 2.5 MCG/ML W/ROPIVACAINE 0.15% IN NS 100 ML EPIDURAL (ARMC): 12.0000 mL/h | EPIDURAL | Status: DC

## 2020-02-11 MED ORDER — DIPHENHYDRAMINE HCL 25 MG PO CAPS: 25.0000 mg | ORAL_CAPSULE | Freq: Four times a day (QID) | ORAL | Status: DC | PRN

## 2020-02-11 MED ORDER — EPHEDRINE 5 MG/ML INJ: 10.0000 mg | INTRAVENOUS | Status: DC | PRN

## 2020-02-11 MED ORDER — LACTATED RINGERS IV SOLN: 500.0000 mL | Freq: Once | INTRAVENOUS | Status: DC

## 2020-02-11 MED ORDER — OXYCODONE-ACETAMINOPHEN 5-325 MG PO TABS: 2.0000 | ORAL_TABLET | ORAL | Status: DC | PRN

## 2020-02-11 MED ORDER — LACTATED RINGERS IV BOLUS: 1000.0000 mL | Freq: Once | INTRAVENOUS | Status: DC

## 2020-02-11 MED ORDER — AMMONIA AROMATIC IN INHA
RESPIRATORY_TRACT | Status: AC
  Filled 2020-02-11: qty 10

## 2020-02-11 MED ORDER — LACTATED RINGERS IV SOLN
500.0000 mL | INTRAVENOUS | Status: DC | PRN
  Administered 2020-02-11: 1000 mL via INTRAVENOUS

## 2020-02-11 MED ORDER — ONDANSETRON HCL 4 MG/2ML IJ SOLN: 4.0000 mg | INTRAMUSCULAR | Status: DC | PRN

## 2020-02-11 MED ORDER — LIDOCAINE HCL (PF) 1 % IJ SOLN: 30.0000 mL | INTRAMUSCULAR | Status: DC | PRN

## 2020-02-11 MED ORDER — BENZOCAINE-MENTHOL 20-0.5 % EX AERO: 1.0000 "application " | INHALATION_SPRAY | CUTANEOUS | Status: DC | PRN

## 2020-02-11 MED ORDER — SOD CITRATE-CITRIC ACID 500-334 MG/5ML PO SOLN: 30.0000 mL | ORAL | Status: DC | PRN

## 2020-02-11 MED ORDER — LABETALOL HCL 5 MG/ML IV SOLN
20.0000 mg | INTRAVENOUS | Status: DC | PRN
  Administered 2020-02-11: 20 mg via INTRAVENOUS
  Filled 2020-02-11: qty 4

## 2020-02-11 MED ORDER — FENTANYL 2.5 MCG/ML W/ROPIVACAINE 0.15% IN NS 100 ML EPIDURAL (ARMC)
EPIDURAL | Status: AC
  Filled 2020-02-11: qty 100

## 2020-02-11 MED ORDER — DIPHENHYDRAMINE HCL 50 MG/ML IJ SOLN: 12.5000 mg | INTRAMUSCULAR | Status: DC | PRN

## 2020-02-11 MED ORDER — OXYTOCIN BOLUS FROM INFUSION
500.0000 mL | Freq: Once | INTRAVENOUS | Status: AC
  Administered 2020-02-11: 500 mL via INTRAVENOUS

## 2020-02-11 MED ORDER — SIMETHICONE 80 MG PO CHEW: 80.0000 mg | CHEWABLE_TABLET | ORAL | Status: DC | PRN

## 2020-02-11 MED ORDER — MISOPROSTOL 50MCG HALF TABLET: 50.0000 ug | ORAL_TABLET | ORAL | Status: DC | PRN

## 2020-02-11 MED ORDER — OXYCODONE HCL 5 MG PO TABS
5.0000 mg | ORAL_TABLET | Freq: Once | ORAL | Status: AC
  Administered 2020-02-11: 5 mg via ORAL

## 2020-02-11 MED ORDER — PRENATAL MULTIVITAMIN CH: 1.0000 | ORAL_TABLET | Freq: Every day | ORAL | Status: DC

## 2020-02-11 MED ORDER — WITCH HAZEL-GLYCERIN EX PADS: 1.0000 "application " | MEDICATED_PAD | CUTANEOUS | Status: DC | PRN

## 2020-02-11 MED ORDER — LIDOCAINE-EPINEPHRINE (PF) 1.5 %-1:200000 IJ SOLN
INTRAMUSCULAR | Status: DC | PRN
  Administered 2020-02-11: 3 mL via EPIDURAL

## 2020-02-11 MED ORDER — BUTORPHANOL TARTRATE 1 MG/ML IJ SOLN: 1.0000 mg | INTRAMUSCULAR | Status: DC | PRN

## 2020-02-11 MED ORDER — LABETALOL HCL 5 MG/ML IV SOLN
40.0000 mg | INTRAVENOUS | Status: DC | PRN
  Administered 2020-02-11: 40 mg via INTRAVENOUS
  Filled 2020-02-11: qty 8

## 2020-02-11 MED ORDER — ACETAMINOPHEN 325 MG PO TABS: 650.0000 mg | ORAL_TABLET | ORAL | Status: DC | PRN

## 2020-02-11 MED ORDER — LIDOCAINE HCL (PF) 2 % IJ SOLN
INTRAMUSCULAR | Status: DC | PRN
  Administered 2020-02-11: 60 mg via EPIDURAL

## 2020-02-11 MED ORDER — DIBUCAINE (PERIANAL) 1 % EX OINT: 1.0000 "application " | TOPICAL_OINTMENT | CUTANEOUS | Status: DC | PRN

## 2020-02-11 MED ORDER — ONDANSETRON HCL 4 MG PO TABS: 4.0000 mg | ORAL_TABLET | ORAL | Status: DC | PRN

## 2020-02-11 MED ORDER — FENTANYL 2.5 MCG/ML W/ROPIVACAINE 0.15% IN NS 100 ML EPIDURAL (ARMC)
EPIDURAL | Status: DC | PRN
  Administered 2020-02-11: 12 mL/h via EPIDURAL

## 2020-02-11 MED ORDER — LACTATED RINGERS IV SOLN
125.0000 mL/h | INTRAVENOUS | Status: DC
  Administered 2020-02-11: 125 mL/h via INTRAVENOUS

## 2020-02-11 MED ORDER — TERBUTALINE SULFATE 1 MG/ML IJ SOLN: 0.2500 mg | Freq: Once | INTRAMUSCULAR | Status: DC | PRN

## 2020-02-11 MED ORDER — LIDOCAINE HCL (PF) 1 % IJ SOLN
INTRAMUSCULAR | Status: AC
  Filled 2020-02-11: qty 30

## 2020-02-11 MED ORDER — OXYTOCIN 40 UNITS IN NORMAL SALINE INFUSION - SIMPLE MED
2.5000 [IU]/h | INTRAVENOUS | Status: DC
  Filled 2020-02-11: qty 1000

## 2020-02-11 MED ORDER — IBUPROFEN 800 MG PO TABS
ORAL_TABLET | ORAL | Status: AC
  Filled 2020-02-11: qty 1

## 2020-02-11 MED ORDER — HYDRALAZINE HCL 10 MG PO TABS
10.0000 mg | ORAL_TABLET | Freq: Four times a day (QID) | ORAL | Status: DC | PRN
  Filled 2020-02-11: qty 1

## 2020-02-11 MED ORDER — SODIUM CHLORIDE 0.9 % IV SOLN
INTRAVENOUS | Status: DC | PRN
  Administered 2020-02-11: 5 mL via EPIDURAL

## 2020-02-11 MED ORDER — ACETAMINOPHEN 325 MG PO TABS
650.0000 mg | ORAL_TABLET | Freq: Four times a day (QID) | ORAL | Status: DC | PRN
  Filled 2020-02-11: qty 2

## 2020-02-11 MED ORDER — ACETAMINOPHEN 500 MG PO TABS
1000.0000 mg | ORAL_TABLET | Freq: Four times a day (QID) | ORAL | Status: DC
  Administered 2020-02-11: 1000 mg via ORAL
  Filled 2020-02-11: qty 2

## 2020-02-11 MED ORDER — LIDOCAINE HCL (PF) 1 % IJ SOLN
INTRAMUSCULAR | Status: DC | PRN
  Administered 2020-02-11: 3 mL via SUBCUTANEOUS

## 2020-02-11 MED ORDER — OXYCODONE-ACETAMINOPHEN 5-325 MG PO TABS: 1.0000 | ORAL_TABLET | ORAL | Status: DC | PRN

## 2020-02-11 MED ORDER — SENNOSIDES-DOCUSATE SODIUM 8.6-50 MG PO TABS: 2.0000 | ORAL_TABLET | ORAL | Status: DC

## 2020-02-11 NOTE — Plan of Care (Signed)
Vaginal delivery of viable female infant @ 46.

## 2020-02-11 NOTE — Anesthesia Preprocedure Evaluation (Signed)
Anesthesia Evaluation  Patient identified by MRN, date of birth, ID band Patient awake    Reviewed: Allergy & Precautions, H&P , NPO status , Patient's Chart, lab work & pertinent test results, reviewed documented beta blocker date and time   History of Anesthesia Complications Negative for: history of anesthetic complications  Airway Mallampati: II  TM Distance: >3 FB Neck ROM: full    Dental no notable dental hx.    Pulmonary neg shortness of breath, neg recent URI, Current Smoker,    Pulmonary exam normal breath sounds clear to auscultation       Cardiovascular Exercise Tolerance: Good hypertension, On Medications (-) angina(-) Past MI Normal cardiovascular exam(-) dysrhythmias (-) Valvular Problems/Murmurs Rhythm:regular Rate:Normal     Neuro/Psych PSYCHIATRIC DISORDERS Anxiety Depression negative neurological ROS     GI/Hepatic Neg liver ROS, GERD  ,  Endo/Other  negative endocrine ROS  Renal/GU negative Renal ROS  negative genitourinary   Musculoskeletal   Abdominal   Peds  Hematology negative hematology ROS (+)   Anesthesia Other Findings Past Medical History: No date: Anxiety No date: Depression No date: Fibroid No date: Gestational diabetes No date: History of D&C 11/2013: History of uterine fibroid     Comment:  [facility]   Reproductive/Obstetrics negative OB ROS                             Anesthesia Physical Anesthesia Plan  ASA: III  Anesthesia Plan: Epidural   Post-op Pain Management:    Induction:   PONV Risk Score and Plan:   Airway Management Planned:   Additional Equipment:   Intra-op Plan:   Post-operative Plan:   Informed Consent: I have reviewed the patients History and Physical, chart, labs and discussed the procedure including the risks, benefits and alternatives for the proposed anesthesia with the patient or authorized representative who has  indicated his/her understanding and acceptance.     Dental Advisory Given  Plan Discussed with: Anesthesiologist, CRNA and Surgeon  Anesthesia Plan Comments:         Anesthesia Quick Evaluation

## 2020-02-11 NOTE — Lactation Note (Signed)
This note was copied from a baby's chart. Lactation Consultation Note  Patient Name: Vanessa Franco [id number] Date: 02/11/2020   Mom voices no desire to breast feed or pump.  Infant formula preparation hand out given and reviewed.  Explained differences and treatment of full breasts, engorgement, plugged ducts and mastitis.  Information given on how to dry up milk.  Encouraged mom to call if she had any further questions or concerns.  Maternal Data    Feeding Feeding Type: Bottle Fed - Formula Nipple Type: Slow - flow  LATCH Score                   Interventions    Lactation Tools Discussed/Used     Consult Status      Jarold Motto 02/11/2020, 8:29 PM

## 2020-02-11 NOTE — H&P (Signed)
Obstetric History and Physical  Vanessa Franco is a 39 y.o. [id number] with IUP at [redacted]w[redacted]d presenting for complaints of ruptured membranes since 0500 this morning.  Of note, patient was scheduled for IOL this morning for h/o HTN and GDM. Patient states she has been having  regular, every 4-5 minutes contractions, no vaginal bleeding, ruptured, clear fluid membranes, with active fetal movement.    Prenatal Course Source of Care: [facility] with onset of care at 11 weeks Pregnancy complications or risks: Patient Active Problem List   Diagnosis Date Noted  . Pregnancy related hip pain in third trimester, antepartum 01/10/2020  . Sciatic pain, left 01/10/2020  . Labor and delivery, indication for care 01/04/2020  . Abdominal pain in pregnancy, second trimester 11/03/2019  . Supervision of high risk elderly multigravida in second trimester 10/03/2019  . Chronic hypertension in pregnancy 06/23/2019  . Anxiety 06/23/2019  . Tobacco abuse 02/04/2016  . Multigravida of advanced maternal age in first trimester 02/04/2016   She plans to breastfeed She desires Depo-Provera for postpartum contraception.   Prenatal labs and studies: ABO, Rh: --/--/PENDING (05/23 0701) Antibody: PENDING (05/23 0701) Rubella: 1.68 (10/16 [id number]) RPR: Non Reactive (03/09 0915)  HBsAg: Negative (10/16 0942)  HIV: Non Reactive (10/16 0942)  [id number] (05/12 1557) 1 hr Glucola  Abnormal (162). 3 hr GTT abnormal.  Genetic screening normal Anatomy US normal    OB History  Gravida Para Term Preterm AB Living  4 1 1   2 1   SAB TAB Ectopic Multiple Live Births  2     0 1    # Outcome Date GA Lbr Len/2nd Weight Sex Delivery Anes PTL Lv  4 Current           3 Term 08/16/16 [redacted]w[redacted]d 09:15 / 01:20 2720 g F Vag-Spont EPI  LIV  2 SAB 2002        FD  1 SAB 2001        FD    Past Medical History:  Diagnosis Date  . Anxiety   . Depression   . Fibroid   . Gestational diabetes   . History of D&C   .  History of uterine fibroid 11/2013   [facility]    Past Surgical History:  Procedure Laterality Date  . DILATION AND CURETTAGE OF UTERUS    . MYOMECTOMY  2015   Hysteroscopic    Social History   Socioeconomic History  . Marital status: Single    Spouse name: Not on file  . Number of children: Not on file  . Years of education: Not on file  . Highest education level: Not on file  Occupational History  . Occupation: Scientist, clinical (histocompatibility and immunogenetics): [employer]  Tobacco Use  . Smoking status: Current Some Day Smoker    Packs/day: 0.25  . Smokeless tobacco: Never Used  Substance and Sexual Activity  . Alcohol use: Not Currently    Alcohol/week: 0.0 standard drinks    Comment: occass  . Drug use: Not Currently    Types: Marijuana  . Sexual activity: Yes    Partners: Male    Birth control/protection: Injection  Other Topics Concern  . Not on file  Social History Narrative  . Not on file   Social Determinants of Health   Financial Resource Strain:   . Difficulty of Paying Living Expenses:   Food Insecurity:   . Worried About Charity fundraiser in the Last Year:   . [employer]  of Food in the Last Year:   Transportation Needs:   . Film/video editor (Medical):   Marland Kitchen Lack of Transportation (Non-Medical):   Physical Activity:   . Days of Exercise per Week:   . Minutes of Exercise per Session:   Stress:   . Feeling of Stress :   Social Connections:   . Frequency of Communication with Friends and Family:   . Frequency of Social Gatherings with Friends and Family:   . Attends Religious Services:   . Active Member of Clubs or Organizations:   . Attends Archivist Meetings:   Marland Kitchen Marital Status:     Family History  Problem Relation Age of Onset  . Diabetes Father   . Kidney failure Father   . Heart failure Father   . Rheum arthritis Mother   . Hypertension Mother     Medications Prior to Admission  Medication Sig Dispense Refill Last Dose  . acetaminophen  (TYLENOL) 500 MG tablet Take 500 mg by mouth every 6 (six) hours as needed.   Past Week at Unknown time  . Blood Glucose Monitoring Suppl DEVI 1 Device by Does not apply route as directed. 1 Device 0 02/10/2020 at Unknown time  . glucose blood test strip 1 each by Other route 4 (four) times daily. Check fasting and 2 hr postprandial blood sugars. 100 each 6 02/10/2020 at Unknown time  . Lancets (ONETOUCH ULTRASOFT) lancets Check blood sugars four times daily 100 each 6 02/10/2020 at Unknown time  . omega-3 acid ethyl esters (LOVAZA) 1 g capsule Take by mouth daily.   Past Week at Unknown time  . pantoprazole (PROTONIX) 20 MG tablet Take 1 tablet (20 mg total) by mouth daily. 90 tablet 1 Past Week at Unknown time  . Prenatal-Fe Fum-Methf-FA w/o A (VITAFOL-NANO) 18-0.6-0.4 MG TABS Take 0.4 mg by mouth daily. 30 tablet 11 Past Week at Unknown time  . labetalol (NORMODYNE) 200 MG tablet Take 1 tablet (200 mg total) by mouth 2 (two) times daily. (Patient not taking: Reported on 01/10/2020) 60 tablet 11 Completed Course at Unknown time  . Magnesium Oxide 400 MG CAPS Take 1 capsule (400 mg total) by mouth in the morning and at bedtime. (Patient not taking: Reported on 02/11/2020) 60 capsule 1 Not Taking at Unknown time    Allergies  Allergen Reactions  . Ketorolac Other (See Comments)    Swelling, water retention  . Toradol [Ketorolac Tromethamine] Swelling  . Sulfa Antibiotics Rash    Review of Systems: Negative except for what is mentioned in HPI.  Physical Exam: BP (!) 180/89   Pulse 77   Temp 97.8 F (36.6 C) (Oral)   Resp 18   Ht 5\' 6"  (1.676 m)   Wt 93 kg   LMP 05/14/2019   BMI 33.09 kg/m  CONSTITUTIONAL: Well-developed, well-nourished female in no acute distress.  HENT:  Normocephalic, atraumatic, External right and left ear normal. Oropharynx is clear and moist EYES: Conjunctivae and EOM are normal. Pupils are equal, round, and reactive to light. No scleral icterus.  NECK: Normal range  of motion, supple, no masses SKIN: Skin is warm and dry. No rash noted. Not diaphoretic. No erythema. No pallor. NEUROLOGIC: Alert and oriented to person, place, and time. Normal reflexes, muscle tone coordination. No cranial nerve deficit noted. PSYCHIATRIC: Normal mood and affect. Normal behavior. Normal judgment and thought content. CARDIOVASCULAR: Normal heart rate noted, regular rhythm RESPIRATORY: Effort and breath sounds normal, no problems with respiration noted ABDOMEN:  Soft, nontender, nondistended, gravid. MUSCULOSKELETAL: Normal range of motion. No edema and no tenderness. 2+ distal pulses.  Cervical Exam: Dilatation 5.5 cm   Effacement 80%   Station -2   Presentation: cephalic FHT:  Baseline rate 130 bpm   Variability moderate  Accelerations present   Decelerations none Contractions: Every 4-5 mins   Pertinent Labs/Studies:   Results for orders placed or performed during the hospital encounter of 02/11/20 (from the past 24 hour(s))  CBC     Status: None   Collection Time: 02/11/20  7:01 AM  Result Value Ref Range   WBC 9.6 4.0 - 10.5 K/uL   RBC 3.99 3.87 - 5.11 MIL/uL   Hemoglobin 12.6 12.0 - 15.0 g/dL   HCT 36.8 36.0 - 46.0 %   MCV 92.2 80.0 - 100.0 fL   MCH 31.6 26.0 - 34.0 pg   MCHC 34.2 30.0 - 36.0 g/dL   RDW 13.0 11.5 - 15.5 %   Platelets 174 150 - 400 K/uL   nRBC 0.0 0.0 - 0.2 %  Type and screen     Status: None (Preliminary result)   Collection Time: 02/11/20  7:01 AM  Result Value Ref Range   ABO/RH(D) PENDING    Antibody Screen PENDING    Sample Expiration      02/14/2020,2359 Performed at [facility], [address] [place], [place] [address]   Comprehensive metabolic panel     Status: Abnormal   Collection Time: 02/11/20  7:01 AM  Result Value Ref Range   Sodium 136 135 - 145 mmol/L   Potassium 3.7 3.5 - 5.1 mmol/L   Chloride 106 98 - 111 mmol/L   CO2 23 22 - 32 mmol/L   Glucose, Bld 105 (H) 70 - 99 mg/dL   BUN 11 6 - 20 mg/dL    Creatinine, Ser 0.49 0.44 - 1.00 mg/dL   Calcium 9.0 8.9 - 10.3 mg/dL   Total Protein 6.6 6.5 - 8.1 g/dL   Albumin 3.1 (L) 3.5 - 5.0 g/dL   AST 15 15 - 41 U/L   ALT 7 0 - 44 U/L   Alkaline Phosphatase 139 (H) 38 - 126 U/L   Total Bilirubin 1.0 0.3 - 1.2 mg/dL   GFR calc non Af Amer >60 >60 mL/min   GFR calc Af Amer >60 >60 mL/min   Anion gap 7 5 - 15  Protein / creatinine ratio, urine     Status: None   Collection Time: 02/11/20  7:01 AM  Result Value Ref Range   Creatinine, Urine 118 mg/dL   Total Protein, Urine 15 mg/dL   Protein Creatinine Ratio 0.13 0.00 - 0.15 mg/mg[Cre]    Assessment : Vanessa Franco is a 39 y.o. G4P1021 at [redacted]w[redacted]d being admitted for PROM, cHTN (no meds), and GDM (diet).  Plan: Labor: Expectant management. Augmentation if needed with Pitocin per protocol. Analgesia as needed. CHTN with no meds, however BPs significantly elevated today. Labetalol for severe range BPs.  [place] labs ordered.  FWB: Reassuring fetal heart tracing.  GBS negative Delivery plan: Hopeful for vaginal delivery    [name], MD [facility]

## 2020-02-11 NOTE — Anesthesia Procedure Notes (Signed)
Epidural Patient location during procedure: OB Start time: 02/11/2020 8:12 AM End time: 02/11/2020 8:22 AM  Staffing Anesthesiologist: Martha Clan, MD Performed: anesthesiologist   Preanesthetic Checklist Completed: patient identified, IV checked, site marked, risks and benefits discussed, surgical consent, monitors and equipment checked, pre-op evaluation and timeout performed  Epidural Patient position: sitting Prep: ChloraPrep Patient monitoring: heart rate, continuous pulse ox and blood pressure Approach: midline Location: L3-L4 Injection technique: LOR saline  Needle:  Needle type: Tuohy  Needle gauge: 17 G Needle length: 9 cm and 9 Needle insertion depth: 8 cm Catheter type: closed end flexible Catheter size: 19 Gauge Catheter at skin depth: 13 cm Test dose: negative and 1.5% lidocaine with Epi 1:200 K  Assessment Sensory level: T10 Events: blood not aspirated, injection not painful, no injection resistance, no paresthesia and negative IV test  Additional Notes 1st attempt Pt. Evaluated and documentation done after procedure finished. Patient identified. Risks/Benefits/Options discussed with patient including but not limited to bleeding, infection, nerve damage, paralysis, failed block, incomplete pain control, headache, blood pressure changes, nausea, vomiting, reactions to medication both or allergic, itching and postpartum back pain. Confirmed with bedside nurse the patient's most recent platelet count. Confirmed with patient that they are not currently taking any anticoagulation, have any bleeding history or any family history of bleeding disorders. Patient expressed understanding and wished to proceed. All questions were answered. Sterile technique was used throughout the entire procedure. Please see nursing notes for vital signs. Test dose was given through epidural catheter and negative prior to continuing to dose epidural or start infusion. Warning signs of high  block given to the patient including shortness of breath, tingling/numbness in hands, complete motor block, or any concerning symptoms with instructions to call for help. Patient was given instructions on fall risk and not to get out of bed. All questions and concerns addressed with instructions to call with any issues or inadequate analgesia.   Patient tolerated the insertion well without immediate complications.Reason for block:procedure for pain

## 2020-02-12 LAB — CBC
HCT: 32 % — ABNORMAL LOW (ref 36.0–46.0)
Hemoglobin: 10.8 g/dL — ABNORMAL LOW (ref 12.0–15.0)
MCH: 31 pg (ref 26.0–34.0)
MCHC: 33.8 g/dL (ref 30.0–36.0)
MCV: 92 fL (ref 80.0–100.0)
Platelets: 155 10*3/uL (ref 150–400)
RBC: 3.48 MIL/uL — ABNORMAL LOW (ref 3.87–5.11)
RDW: 13.2 % (ref 11.5–15.5)
WBC: 9.2 10*3/uL (ref 4.0–10.5)
nRBC: 0 % (ref 0.0–0.2)

## 2020-02-12 LAB — RPR: RPR Ser Ql: NONREACTIVE

## 2020-02-12 MED ORDER — IBUPROFEN 800 MG PO TABS
800.0000 mg | ORAL_TABLET | Freq: Three times a day (TID) | ORAL | 1 refills | Status: DC | PRN
Start: 2020-02-12 — End: 2020-11-27

## 2020-02-12 MED ORDER — MEDROXYPROGESTERONE ACETATE 150 MG/ML IM SUSP
150.0000 mg | Freq: Once | INTRAMUSCULAR | Status: AC
  Administered 2020-02-12: 150 mg via INTRAMUSCULAR
  Filled 2020-02-12: qty 1

## 2020-02-12 NOTE — Anesthesia Postprocedure Evaluation (Signed)
Anesthesia Post Note  Patient: Vanessa Franco  Procedure(s) Performed: AN AD HOC LABOR EPIDURAL  Patient location during evaluation: Mother Baby Anesthesia Type: General Level of consciousness: awake, awake and alert and oriented Pain management: pain level controlled Vital Signs Assessment: post-procedure vital signs reviewed and stable Respiratory status: spontaneous breathing, nonlabored ventilation and respiratory function stable Cardiovascular status: blood pressure returned to baseline and stable Postop Assessment: no headache and no backache Anesthetic complications: no     Last Vitals:  Vitals:   02/11/20 2313 02/12/20 0403  BP: 138/77 133/82  Pulse: 78 75  Resp: 20 18  Temp: 36.7 C 36.7 C  SpO2: 99% 100%    Last Pain:  Vitals:   02/12/20 0456  TempSrc:   PainSc: 0-No pain                 Johnna Acosta

## 2020-02-12 NOTE — Discharge Summary (Signed)
Postpartum Discharge Summary     Patient Name: Vanessa Franco DOB: 1981/04/07 MRN: [id number]  Date of admission: 02/11/2020 Delivery date:02/11/2020  Delivering provider: [name]  Date of discharge: 02/12/2020  Admitting diagnosis: Chronic hypertension in pregnancy [O10.919] Intrauterine pregnancy: [redacted]w[redacted]d    Secondary diagnosis:  Active Problems:   Gestational Diabetes, Class A1   Premature Rupture of Membranes at Term, with onset of labor within 24 hours  Additional problems: Advanced maternal age   Discharge diagnosis: Term Pregnancy Delivered, CHTN and GDM A1                Post partum procedures:None Augmentation: None Complications: None  Hospital course: Onset of Labor With Vaginal Delivery      39y.o. yo [id number] 39w0das admitted in Active Labor on 02/11/2020. Patient had an uncomplicated labor course as follows:  Membrane Rupture Time/Date: 5:30 AM ,02/11/2020   Delivery Method:Vaginal, Spontaneous  Episiotomy: None  Lacerations:  1st degree;Labial  Patient had an uncomplicated postpartum course.  She is ambulating, tolerating a regular diet, passing flatus, and urinating well. Patient is discharged home in stable condition on 02/12/20.  Newborn Data: Birth date:02/11/2020  Birth time:9:59 AM  Gender:Female  Living status:Living  Apgars:8 ,9  Weight:2710 g   Magnesium Sulfate received: No BMZ received: No Rhophylac:N/A MMR:No T-DaP:Given prenatally Flu: No Transfusion:No  Physical exam  Vitals:   02/11/20 2027 02/11/20 2313 02/12/20 0403 02/12/20 0734  BP: 115/70 138/77 133/82 133/85  Pulse: 81 78 75 80  Resp: '20 20 18 20  '$ Temp: 98.1 F (36.7 C) 98 F (36.7 C) 98.1 F (36.7 C) 98.1 F (36.7 C)  TempSrc: Oral Oral Oral Oral  SpO2: 100% 99% 100% 100%  Weight:      Height:       General: alert, cooperative and no distress Lochia: appropriate Uterine Fundus: firm Incision: N/A DVT Evaluation: No evidence of DVT seen on physical  exam. Negative Homan's sign. No cords or calf tenderness. No significant calf/ankle edema. Labs: Lab Results  Component Value Date   WBC 9.2 02/12/2020   HGB 10.8 (L) 02/12/2020   HCT 32.0 (L) 02/12/2020   MCV 92.0 02/12/2020   PLT 155 02/12/2020   CMP Latest Ref Rng & Units 02/11/2020  Glucose 70 - 99 mg/dL 105(H)  BUN 6 - 20 mg/dL 11  Creatinine 0.44 - 1.00 mg/dL 0.49  Sodium 135 - 145 mmol/L 136  Potassium 3.5 - 5.1 mmol/L 3.7  Chloride 98 - 111 mmol/L 106  CO2 22 - 32 mmol/L 23  Calcium 8.9 - 10.3 mg/dL 9.0  Total Protein 6.5 - 8.1 g/dL 6.6  Total Bilirubin 0.3 - 1.2 mg/dL 1.0  Alkaline Phos 38 - 126 U/L 139(H)  AST 15 - 41 U/L 15  ALT 0 - 44 U/L 7   Edinburgh Score: Edinburgh Postnatal Depression Scale Screening Tool 02/11/2020  I have been able to laugh and see the funny side of things. 0  I have looked forward with enjoyment to things. 0  I have blamed myself unnecessarily when things went wrong. 2  I have been anxious or worried for no good reason. 0  I have felt scared or panicky for no good reason. 0  Things have been getting on top of me. 0  I have been so unhappy that I have had difficulty sleeping. 0  I have felt sad or miserable. 0  I have been so unhappy that I have been crying. 0  The thought of harming myself has occurred to me. 0  Edinburgh Postnatal Depression Scale Total 2      After visit meds:  Allergies as of 02/12/2020      Reactions   Ketorolac Other (See Comments)   Swelling, water retention   Toradol [ketorolac Tromethamine] Swelling   Sulfa Antibiotics Rash      Medication List    STOP taking these medications   acetaminophen 500 MG tablet Commonly known as: TYLENOL   Blood Glucose Monitoring Suppl Devi   glucose blood test strip   Magnesium Oxide 400 MG Caps   onetouch ultrasoft lancets   pantoprazole 20 MG tablet Commonly known as: Protonix     TAKE these medications   ibuprofen 800 MG tablet Commonly known as:  ADVIL Take 1 tablet (800 mg total) by mouth every 8 (eight) hours as needed.   labetalol 200 MG tablet Commonly known as: NORMODYNE Take 1 tablet (200 mg total) by mouth 2 (two) times daily.   omega-3 acid ethyl esters 1 g capsule Commonly known as: LOVAZA Take by mouth daily.   Vitafol-Nano 18-0.6-0.4 MG Tabs Take 0.4 mg by mouth daily.        Discharge home in stable condition Infant Feeding: Bottle and may consider breastfeeding Infant Disposition:NICU (for jaundice treatment) Discharge instruction: per After Visit Summary and Postpartum booklet. Activity: Advance as tolerated. Pelvic rest for 6 weeks.  Diet: low salt diet Anticipated Birth Control: PP Depo given Postpartum Appointment:6 weeks Additional Postpartum F/U: BP check 1 week Future Appointments: Future Appointments  Date Time Provider [place]  02/14/2020  2:30 PM [facility]  02/15/2020  1:45 PM [name], MD [facility] None        02/12/2020 [name], MD

## 2020-02-12 NOTE — Progress Notes (Signed)
CSW received consult for hx of marijuana use.  Referral was screened out due to the following: ~MOB had no documented substance use after initial prenatal visit/+UPT. ~MOB had no positive drug screens after initial prenatal visit/+UPT. ~Baby's UDS is negative.  CSW spoke with RN Skyler, no concerns. Per RN, MOB is appropriate. Edinburgh Score is 2.  Please consult CSW if current concerns arise or by MOB's request.  CSW will monitor CDS results per Policy and make report to Child Protective Services if warranted.  Oleh Genin, [place]

## 2020-02-12 NOTE — Discharge Instructions (Signed)

## 2020-02-12 NOTE — Progress Notes (Signed)
Patient discharged home with family, infant remains in SCN. Discharge instructions and prescriptions given and reviewed with patient. Depo given prior to discharge. Patient verbalized understanding. Escorted out by auxillary.

## 2020-02-12 NOTE — Progress Notes (Signed)
Post Partum Day # 1, s/p SVD  Subjective: no complaints, up ad lib, voiding and tolerating PO  Objective: Temp:  [98 F (36.7 C)-98.9 F (37.2 C)] 98.1 F (36.7 C) (05/24 0734) Pulse Rate:  [60-101] 80 (05/24 0734) Resp:  [14-20] 20 (05/24 0734) BP: (101-138)/(43-88) 133/85 (05/24 0734) SpO2:  [98 %-100 %] 100 % (05/24 0734)  Physical Exam:  General: alert and no distress  Lungs: clear to auscultation bilaterally Breasts: normal appearance, no masses or tenderness Heart: regular rate and rhythm, S1, S2 normal, no murmur, click, rub or gallop Abdomen: soft, non-tender; bowel sounds normal; no masses,  no organomegaly Pelvis: Lochia: appropriate, Uterine Fundus: firm Extremities: DVT Evaluation: No evidence of DVT seen on physical exam. Negative Homan's sign. No cords or calf tenderness. No significant calf/ankle edema.  Recent Labs    02/11/20 0701 02/12/20 0709  HGB 12.6 10.8*  HCT 36.8 32.0*    Assessment/Plan: Doing well postpartum Breastfeeding, Lactation consult, as needed. Contraception Depo Provera, to be given prior to discharge.  Infant currently in [place] for jaundice. Circumcision prior to discharge. Mild anemia postpartum, asymptomatic.  No additional treatment needed outside of PNV with iron.  CHTN, no meds. Had elevated BPs during labor, ruled out for pre-eclampsia. Will encourage to resume HTN meds (Labetalol) in postpartum period.  Patient desires to discharge home today.      LOS: 1 day   [name], MD [facility] 02/12/2020 9:28 AM

## 2020-02-14 ENCOUNTER — Ambulatory Visit: Payer: Medicaid Other

## 2020-02-15 ENCOUNTER — Other Ambulatory Visit: Payer: Self-pay

## 2020-02-15 ENCOUNTER — Encounter: Payer: Self-pay | Admitting: Obstetrics and Gynecology

## 2020-02-15 ENCOUNTER — Ambulatory Visit (INDEPENDENT_AMBULATORY_CARE_PROVIDER_SITE_OTHER): Payer: Medicaid Other | Admitting: Obstetrics and Gynecology

## 2020-02-15 VITALS — BP 138/72 | HR 66 | Ht 66.0 in | Wt 196.4 lb

## 2020-02-15 DIAGNOSIS — O10919 Unspecified pre-existing hypertension complicating pregnancy, unspecified trimester: Secondary | ICD-10-CM | POA: Diagnosis not present

## 2020-02-15 NOTE — Progress Notes (Signed)
Pt present for blood pressure check. Pt stated that she was doing well.   Blood pressure 138/72, pulse 66, height 5\' 6"  (1.676 m), weight 196 lb 6.4 oz (89.1 kg), unknown if currently breastfeeding.

## 2020-03-21 ENCOUNTER — Telehealth: Payer: Self-pay | Admitting: Obstetrics and Gynecology

## 2020-03-21 NOTE — Telephone Encounter (Signed)
Patient called in stating she needed to reschedule her ppv as she is starting a new job. Gave patient several options those of which she couldn't do because she cant miss a day of work with her new job. Her work schedule is 9am- 6pm Monday-Friday and she denied any early morning appointment. Only option was this Friday at 3 pm. Given the okay by nurse to place her on schedule for tomorrow at 3.

## 2020-03-22 ENCOUNTER — Telehealth: Payer: Self-pay | Admitting: Obstetrics and Gynecology

## 2020-03-22 ENCOUNTER — Encounter: Payer: Medicaid Other | Admitting: Obstetrics and Gynecology

## 2020-03-22 NOTE — Telephone Encounter (Signed)
Pt called in and stated she cant make there appt today she has both kids. The pt starts a new job on the 12th of the month. She said that she had an appt on the 6th but someone took that spot. I told the pt I would send a message and give her a call. Please advise

## 2020-03-26 ENCOUNTER — Encounter: Payer: Medicaid Other | Admitting: Obstetrics and Gynecology

## 2020-03-29 ENCOUNTER — Encounter: Payer: Medicaid Other | Admitting: Obstetrics and Gynecology

## 2020-04-15 ENCOUNTER — Telehealth: Payer: Self-pay | Admitting: Obstetrics and Gynecology

## 2020-04-15 NOTE — Telephone Encounter (Signed)
Pt called in and stated that she wants to come in and get her depo injection. I asked the pt when she got her last and she stated that it was before she was discharged from the hospital (5/24). The pt made an appointment for 8/26, the pt is requesting a refill sent to [facility] in [place]. Please advise

## 2020-04-18 ENCOUNTER — Telehealth: Payer: Self-pay | Admitting: Obstetrics and Gynecology

## 2020-04-18 NOTE — Telephone Encounter (Signed)
Please contact the pt.  She needs to be rescheduled and seen before 8/26 due to she will be late taking her depo. Her next injection should be between the dates of  August 9-23, 2021.  Thanks [employer]

## 2020-04-18 NOTE — Telephone Encounter (Signed)
Patient is scheduled for tomorrow with Dr. Marcelline Mates.

## 2020-04-18 NOTE — Telephone Encounter (Signed)
Pt called in and stated that she has lower abdominal pain. The pt stated she has had it for 2 days now, its a on and off pain. The pt was requesting to be seen today. I told her I would have to send a message to the nurse that she is out of the office at the moment. The pt verbally understood. Please advise

## 2020-04-19 ENCOUNTER — Encounter: Payer: Self-pay | Admitting: Obstetrics and Gynecology

## 2020-04-19 ENCOUNTER — Ambulatory Visit (INDEPENDENT_AMBULATORY_CARE_PROVIDER_SITE_OTHER): Payer: Medicaid Other | Admitting: Obstetrics and Gynecology

## 2020-04-19 VITALS — BP 139/81 | HR 86 | Ht 66.0 in | Wt 190.0 lb

## 2020-04-19 DIAGNOSIS — N760 Acute vaginitis: Secondary | ICD-10-CM | POA: Diagnosis not present

## 2020-04-19 DIAGNOSIS — B9689 Other specified bacterial agents as the cause of diseases classified elsewhere: Secondary | ICD-10-CM | POA: Diagnosis not present

## 2020-04-19 MED ORDER — FLUCONAZOLE 150 MG PO TABS: 150.0000 mg | ORAL_TABLET | Freq: Once | ORAL | 1 refills | Status: AC

## 2020-04-19 NOTE — Progress Notes (Signed)
    GYNECOLOGY PROGRESS NOTE  Subjective:    Patient ID: Vanessa Franco, female    DOB: [birth year], 39 y.o.   MRN: [id number]  HPI  Patient is a 39 y.o. G76P1021 female who presents for complaints of discharge x 2 weeks after she finally stopped her postpartum bleeding.  Then 2 days ago she felt sharp pain in her abdomen, lasted 5 minutes, then partially subsided. Still feels it sometimes if she presses on it. She is currently 9 weeks postpartum. Also notes that intercourse is painful.   The following portions of the patient's history were reviewed and updated as appropriate: allergies, current medications, past family history, past medical history, past social history, past surgical history and problem list.  Review of Systems Pertinent items noted in HPI and remainder of comprehensive ROS otherwise negative.   Objective:   Blood pressure (!) 139/81, pulse 86, height 5\' 6"  (1.676 m), weight 190 lb (86.2 kg), unknown if currently breastfeeding. General appearance: alert and no distress Abdomen: soft, non-tender; bowel sounds normal; no masses,  no organomegaly Pelvic: external genitalia normal, rectovaginal septum normal.  Vagina with moderate gray-white discharge.  Cervix normal appearing, no lesions and no motion tenderness.  Uterus mobile, nontender, normal shape and size.  Adnexae non-palpable, nontender bilaterally.    Labs:  Microscopic wet-mount exam shows moderate clue cells, no hyphae, no trichomonads, no white blood cells. KOH done.    Assessment:   Bacterial vaginosis  Plan:   - Sample of Clindesse 1 day vaginal treatment given for BV infection. Also prescribed Diflucan to use prn in case of vaginal yeast infection after antibiotic use. Return to clinic for any scheduled appointments or for any gynecologic concerns as needed.    [name], MD [facility]

## 2020-04-19 NOTE — Progress Notes (Signed)
Pt present for LL ABD pain. Pt stated that pain is more in the middle of her abd below her belly button x 3 days.

## 2020-04-20 NOTE — Patient Instructions (Signed)

## 2020-05-07 ENCOUNTER — Telehealth: Payer: Self-pay | Admitting: Obstetrics and Gynecology

## 2020-05-07 NOTE — Telephone Encounter (Signed)
Patient called in stating that she needs a refill on her depo injection. Could you please advise?

## 2020-05-08 MED ORDER — MEDROXYPROGESTERONE ACETATE 150 MG/ML IM SUSP
150.0000 mg | INTRAMUSCULAR | 3 refills | Status: DC
Start: 2020-05-08 — End: 2020-11-25

## 2020-05-08 NOTE — Telephone Encounter (Signed)
Pt called in and stated that she needs a refill on her depo sent to to [facility] on [address]. The pt has an appointment in the morning for it she stated. I told her I would send a message to the nurse

## 2020-05-08 NOTE — Telephone Encounter (Signed)
Spoke to pt concerning her medication refill medication refilled and sent to her pharmacy. Pt is aware.

## 2020-05-09 ENCOUNTER — Ambulatory Visit (INDEPENDENT_AMBULATORY_CARE_PROVIDER_SITE_OTHER): Payer: Medicaid Other

## 2020-05-09 ENCOUNTER — Other Ambulatory Visit: Payer: Self-pay

## 2020-05-09 DIAGNOSIS — Z3042 Encounter for surveillance of injectable contraceptive: Secondary | ICD-10-CM

## 2020-05-09 MED ORDER — MEDROXYPROGESTERONE ACETATE 150 MG/ML IM SUSP
150.0000 mg | Freq: Once | INTRAMUSCULAR | Status: AC
  Administered 2020-05-09: 150 mg via INTRAMUSCULAR

## 2020-05-09 NOTE — Progress Notes (Signed)
Last depo inj: 02/12/20 Side effects: none Next Depo- Provera injection due: 07/25/20-08/08/20 Annual exam due: 03/16/22

## 2020-05-16 ENCOUNTER — Ambulatory Visit: Payer: Medicaid Other
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
## 2020-07-22 ENCOUNTER — Telehealth: Payer: Self-pay

## 2020-07-22 NOTE — Telephone Encounter (Signed)
Patient called in requesting to speak to Dr. Marcelline Mates regarding getting a tubal ligation. I wanted to make patient appointment but she can only do Mondays and she doesn't get off work until 4:30 PM leaving her availability slim to none. Patient would like a call back. Could you please advise?

## 2020-07-23 NOTE — Telephone Encounter (Signed)
Please advise. Thanks [employer]man Hughlett 

## 2020-07-23 NOTE — Telephone Encounter (Signed)
Not sure how that's going to work. We can do a televisit for an initial conversation (a 4:30 appointment), but she will need to come in for an actual visit to sign some paperwork and do her pre-op exam.  If she has a lunchbreak or can take an early lunch, we may be able to get her in that way.

## 2020-08-05 ENCOUNTER — Ambulatory Visit (INDEPENDENT_AMBULATORY_CARE_PROVIDER_SITE_OTHER): Payer: Medicaid Other

## 2020-08-05 ENCOUNTER — Other Ambulatory Visit: Payer: Self-pay

## 2020-08-05 DIAGNOSIS — Z3042 Encounter for surveillance of injectable contraceptive: Secondary | ICD-10-CM | POA: Diagnosis not present

## 2020-08-05 MED ORDER — MEDROXYPROGESTERONE ACETATE 150 MG/ML IM SUSP
150.0000 mg | Freq: Once | INTRAMUSCULAR | Status: AC
  Administered 2020-08-05: 150 mg via INTRAMUSCULAR

## 2020-08-05 NOTE — Progress Notes (Signed)
Date last pap: 03/16/2017 pap due. Last Depo-Provera: 05/09/2020. Side Effects if any: none. Serum HCG indicated? N/a. Depo-Provera 150 mg IM given by: FHampton, LPN. Next appointment due Jan 31-Nov 04, 2020.

## 2020-08-13 ENCOUNTER — Ambulatory Visit: Payer: Self-pay | Admitting: Obstetrics and Gynecology

## 2020-08-30 ENCOUNTER — Other Ambulatory Visit
Admission: RE | Admit: 2020-08-30 | Discharge: 2020-08-30 | Disposition: A | Discharge status: Home / Self Care | Payer: Medicaid Other | Source: Ambulatory Visit | Attending: Student | Admitting: Student

## 2020-08-30 DIAGNOSIS — R079 Chest pain, unspecified: Secondary | ICD-10-CM | POA: Diagnosis present

## 2020-08-30 DIAGNOSIS — R0600 Dyspnea, unspecified: Secondary | ICD-10-CM | POA: Insufficient documentation

## 2020-08-30 LAB — FIBRIN DERIVATIVES D-DIMER (ARMC ONLY): Fibrin derivatives D-dimer (ARMC): 164.06 ng/mL (FEU) (ref 0.00–499.00)

## 2020-09-13 DIAGNOSIS — U071 COVID-19: Secondary | ICD-10-CM

## 2020-09-13 HISTORY — DX: COVID-19: U07.1

## 2020-09-16 ENCOUNTER — Other Ambulatory Visit: Payer: Medicaid Other

## 2020-09-26 ENCOUNTER — Emergency Department
Admission: EM | Admit: 2020-09-26 | Discharge: 2020-09-26 | Disposition: A | Discharge status: Home / Self Care | Payer: Medicaid Other | Attending: Emergency Medicine | Admitting: Emergency Medicine

## 2020-09-26 ENCOUNTER — Other Ambulatory Visit: Payer: Self-pay

## 2020-09-26 ENCOUNTER — Emergency Department: Payer: Medicaid Other

## 2020-09-26 DIAGNOSIS — F17210 Nicotine dependence, cigarettes, uncomplicated: Secondary | ICD-10-CM | POA: Insufficient documentation

## 2020-09-26 DIAGNOSIS — R059 Cough, unspecified: Secondary | ICD-10-CM | POA: Diagnosis not present

## 2020-09-26 DIAGNOSIS — R0602 Shortness of breath: Secondary | ICD-10-CM

## 2020-09-26 MED ORDER — ALBUTEROL SULFATE HFA 108 (90 BASE) MCG/ACT IN AERS: 2.0000 | INHALATION_SPRAY | Freq: Four times a day (QID) | RESPIRATORY_TRACT | 0 refills | Status: DC | PRN

## 2020-09-26 MED ORDER — PREDNISONE 20 MG PO TABS
20.0000 mg | ORAL_TABLET | Freq: Two times a day (BID) | ORAL | 0 refills | Status: AC
Start: 2020-09-26 — End: 2020-10-01

## 2020-09-26 NOTE — Discharge Instructions (Addendum)
Your chest XR is negative. Take the prescription meds as directed. Follow-up with your provider as needed.

## 2020-09-26 NOTE — ED Triage Notes (Signed)
Pt states she started with sore throat and cough 12/24 and dx with covid 12/27 states she tried to go back to work today with having SOB and continues to have sore throat. Pt is in NAD at present.

## 2020-09-26 NOTE — ED Provider Notes (Signed)
[facility] Emergency Department Provider Note ____________________________________________  Time seen: 1159  I have reviewed the triage vital signs and the nursing notes.  HISTORY  Chief Complaint  Shortness of Breath   HPI Vanessa Franco is a 40 y.o. female is return to the ED from work, about 10 days after her COVID diagnosis.  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  She was tested by her local provider on 12/27.  She was under quarantine at that time and until the results were available 2 days later.  She was cleared to return to work today, but noted increasing shortness of breath which denies any interim fever, chills, sweats patient reports a intermittently productive cough, but denies any nausea, vomiting, or diarrhea.  Past Medical History:  Diagnosis Date  . Anxiety   . Depression   . Fibroid   . Gestational diabetes   . History of D&C   . History of uterine fibroid 11/2013   [facility]    Patient Active Problem List   Diagnosis Date Noted  . Pregnancy related hip pain in third trimester, antepartum 01/10/2020  . Sciatic pain, left 01/10/2020  . Labor and delivery, indication for care 01/04/2020  . Abdominal pain in pregnancy, second trimester 11/03/2019  . Supervision of high risk elderly multigravida in second trimester 10/03/2019  . Chronic hypertension in pregnancy 06/23/2019  . Anxiety 06/23/2019  . Tobacco abuse 02/04/2016  . Multigravida of advanced maternal age in first trimester 02/04/2016    Past Surgical History:  Procedure Laterality Date  . DILATION AND CURETTAGE OF UTERUS    . MYOMECTOMY  2015   Hysteroscopic    Prior to Admission medications   Medication Sig Start Date End Date Taking? Authorizing Provider  albuterol (VENTOLIN HFA) 108 (90 Base) MCG/ACT inhaler Inhale 2 puffs into the lungs every 6 (six) hours as needed for shortness of breath. 09/26/20  Yes Cobie Marcoux, Dannielle Karvonen, PA-C   predniSONE (DELTASONE) 20 MG tablet Take 1 tablet (20 mg total) by mouth 2 (two) times daily with a meal for 5 days. 09/26/20 10/01/20 Yes Katheren Jimmerson, Dannielle Karvonen, PA-C  ibuprofen (ADVIL) 800 MG tablet Take 1 tablet (800 mg total) by mouth every 8 (eight) hours as needed. 02/12/20   [name], MD  labetalol (NORMODYNE) 200 MG tablet Take 1 tablet (200 mg total) by mouth 2 (two) times daily. 06/23/19   [name], MD  medroxyPROGESTERone (DEPO-PROVERA) 150 MG/ML injection Inject 1 mL (150 mg total) into the muscle every 3 (three) months. 05/08/20   [name], MD  omega-3 acid ethyl esters (LOVAZA) 1 g capsule Take by mouth daily.    [provider]  Prenatal-Fe Fum-Methf-FA w/o A (VITAFOL-NANO) 18-0.6-0.4 MG TABS Take 0.4 mg by mouth daily. 11/28/19   [name], MD    Allergies Ketorolac, Toradol [ketorolac tromethamine], and Sulfa antibiotics  Family History  Problem Relation Age of Onset  . Diabetes Father   . Kidney failure Father   . Heart failure Father   . Rheum arthritis Mother   . Hypertension Mother     Social History Social History   Tobacco Use  . Smoking status: Current Some Day Smoker    Packs/day: 0.25  . Smokeless tobacco: Never Used  Vaping Use  . Vaping Use: Never used  Substance Use Topics  . Alcohol use: Not Currently    Alcohol/week: 0.0 standard drinks    Comment: occass  . Drug use: Not Currently  Types: Marijuana    Review of Systems  Constitutional: Negative for fever. Eyes: Negative for visual changes. ENT: Negative for sore throat. Cardiovascular: Negative for chest pain. Respiratory: Positive for shortness of breath. Gastrointestinal: Negative for abdominal pain, vomiting and diarrhea. Genitourinary: Negative for dysuria. Musculoskeletal: Negative for back pain. Skin: Negative for rash. Neurological: Negative for headaches, focal weakness or numbness. ____________________________________________  PHYSICAL EXAM:  VITAL  SIGNS: ED Triage Vitals  Enc Vitals Group     BP 09/26/20 1105 (!) 143/86     Pulse Rate 09/26/20 1105 91     Resp 09/26/20 1105 17     Temp 09/26/20 1105 98.8 F (37.1 C)     Temp Source 09/26/20 1105 Oral     SpO2 09/26/20 1105 100 %     Weight 09/26/20 1106 194 lb (88 kg)     Height 09/26/20 1106 5\' 7"  (1.702 m)     Head Circumference --      Peak Flow --      Pain Score 09/26/20 1106 7     Pain Loc --      Pain Edu? --      Excl. in GC? --     Constitutional: Alert and oriented. Well appearing and in no distress. Head: Normocephalic and atraumatic. Eyes: Conjunctivae are normal. Normal extraocular movements Cardiovascular: Normal rate, regular rhythm. Normal distal pulses. Respiratory: Normal respiratory effort. No wheezes/rales/rhonchi. Musculoskeletal: Nontender with normal range of motion in all extremities.  Neurologic:  Normal gait without ataxia. Normal speech and language. No gross focal neurologic deficits are appreciated. Skin:  Skin is warm, dry and intact. No rash noted. Psychiatric: Mood and affect are normal. Patient exhibits appropriate insight and judgment. ____________________________________________   RADIOLOGY  CXR  Negative ____________________________________________  PROCEDURES  Procedures ____________________________________________  INITIAL IMPRESSION / ASSESSMENT AND PLAN / ED COURSE  DDX: COVID, CAP, bronchitis, PE  Patient with ED evaluation of ongoing symptoms of shortness of breath in the 10 days since her COVID diagnosis.  Patient denies any other symptoms at this time.  She was set to return to work today, but return to the ED noting some increased redness of breath.  She is without any signs of acute respiratory distress, vital signs are stable, without tachycardia, tachypnea, or fever.  Chest x-ray is also negative reassuring at this time.  Patient exam is also reassuring as I doubt she is experiencing an acute PE at this time.  She  will be discharged with a prescription for albuterol inhaler as well as steroid to take as directed but she is encouraged to take over-the-counter cough medicine as needed.  She will follow with primary provider for ongoing symptoms.  Work note was provided covering her for today.  Vanessa Franco was evaluated in Emergency Department on 09/26/2020 for the symptoms described in the history of present illness. She was evaluated in the context of the global COVID-19 pandemic, which necessitated consideration that the patient might be at risk for infection with the SARS-CoV-2 virus that causes COVID-19. Institutional protocols and algorithms that pertain to the evaluation of patients at risk for COVID-19 are in a state of rapid change based on information released by regulatory bodies including the CDC and federal and state organizations. These policies and algorithms were followed during the patient's care in the ED. ____________________________________________  FINAL CLINICAL IMPRESSION(S) / ED DIAGNOSES  Final diagnoses:  SOB (shortness of breath)      Vanessa Franco, 11/24/2020, PA-C 09/26/20 1218  Vladimir Crofts, MD 09/26/20 [phone]

## 2020-10-09 ENCOUNTER — Encounter: Payer: Medicaid Other | Admitting: Obstetrics and Gynecology

## 2020-10-15 ENCOUNTER — Encounter: Payer: Self-pay | Admitting: Obstetrics and Gynecology

## 2020-10-15 ENCOUNTER — Other Ambulatory Visit: Payer: Self-pay

## 2020-10-15 ENCOUNTER — Ambulatory Visit: Payer: Medicaid Other | Admitting: Obstetrics and Gynecology

## 2020-10-15 NOTE — Progress Notes (Unsigned)
Pt present today for irregular periods. Pt stated having spotting and cramping x 1 months.

## 2020-10-18 ENCOUNTER — Ambulatory Visit (INDEPENDENT_AMBULATORY_CARE_PROVIDER_SITE_OTHER): Payer: Medicaid Other | Admitting: Obstetrics and Gynecology

## 2020-10-18 ENCOUNTER — Other Ambulatory Visit: Payer: Self-pay

## 2020-10-18 ENCOUNTER — Encounter: Payer: Self-pay | Admitting: Obstetrics and Gynecology

## 2020-10-18 VITALS — BP 116/77 | HR 90 | Ht 67.0 in | Wt 195.4 lb

## 2020-10-18 DIAGNOSIS — Z3042 Encounter for surveillance of injectable contraceptive: Secondary | ICD-10-CM

## 2020-10-18 DIAGNOSIS — Z3009 Encounter for other general counseling and advice on contraception: Secondary | ICD-10-CM | POA: Diagnosis not present

## 2020-10-18 DIAGNOSIS — Z302 Encounter for sterilization: Secondary | ICD-10-CM | POA: Diagnosis not present

## 2020-10-18 NOTE — Patient Instructions (Signed)
Laparoscopic Tubal Ligation Laparoscopic tubal ligation is a procedure to close the fallopian tubes. This is done to prevent pregnancy. When the fallopian tubes are closed, the eggs that your ovaries release cannot enter the uterus, and sperm cannot reach the released eggs. You should not have this procedure if you want to get pregnant someday or if you are unsure about having more children. Tell a health care provider about:  Any allergies you have.  All medicines you are taking, including vitamins, herbs, eye drops, creams, and over-the-counter medicines.  Any problems you or family members have had with anesthetic medicines.  Any blood disorders you have.  Any surgeries you have had.  Any medical conditions you have.  Whether you are pregnant or may be pregnant.  Any past pregnancies. What are the risks? Generally, this is a safe procedure. However, problems may occur, including:  Infection.  Bleeding.  Injury to other organs in the abdomen.  Side effects from anesthetic medicines.  Failure of the procedure. This procedure can increase your risk of an ectopic pregnancy. This is a pregnancy in which a fertilized egg attaches to the outside of the uterus. What happens before the procedure? Staying hydrated Follow instructions from your health care provider about hydration, which may include:  Up to 2 hours before the procedure - you may continue to drink clear liquids, such as water, clear fruit juice, black coffee, and plain tea. Eating and drinking restrictions Follow instructions from your health care provider about eating and drinking, which may include:  8 hours before the procedure - stop eating heavy meals or foods, such as meat, fried foods, or fatty foods.  6 hours before the procedure - stop eating light meals or foods, such as toast or cereal.  6 hours before the procedure - stop drinking milk or drinks that contain milk.  2 hours before the procedure - stop  drinking clear liquids. Medicines Ask your health care provider about:  Changing or stopping your regular medicines. This is especially important if you are taking diabetes medicines or blood thinners.  Taking medicines such as aspirin and ibuprofen. These medicines can thin your blood. Do not take these medicines unless your health care provider tells you to take them.  Taking over-the-counter medicines, vitamins, herbs, and supplements. Surgery safety Ask your health care provider:  How your surgery site will be marked.  What steps will be taken to help prevent infection. These steps may include: ? Removing hair at the surgery site. ? Washing skin with a germ-killing soap. ? Taking antibiotic medicine. General instructions  Do not use any products that contain nicotine or tobacco for at least 4 weeks before the procedure. These products include cigarettes, chewing tobacco, and vaping devices, such as e-cigarettes. If you need help quitting, ask your health care provider.  Plan to have someone take you home from the hospital.  If you will be going home right after the procedure, plan to have a responsible adult care for you for the time you are told. This is important. What happens during the procedure?  An IV will be inserted into one of your veins.  You will be given one or more of the following: ? A medicine to help you relax (sedative). ? A medicine to numb the area (local anesthetic). ? A medicine to make you fall asleep (general anesthetic). ? A medicine that is injected into an area of your body to numb everything below the injection site (regional anesthetic).  Your bladder  may be emptied with a small tube (catheter).  If you have been given a general anesthetic, a tube will be put down your throat to help you breathe.  Two small incisions will be made in your lower abdomen and near your belly button.  Your abdomen will be inflated with a gas. This will let the  surgeon see better and will give the surgeon room to work.  A lighted tube with camera (laparoscope) will be inserted into your abdomen through one of the incisions. Small instruments will be inserted through the other incision.  The fallopian tubes will be tied off, burned (cauterized), or blocked with a clip, ring, or clamp. A small portion in the center of each fallopian tube may be removed.  The gas will be released from the abdomen.  The incisions will be closed with stitches (sutures).  A bandage (dressing) will be placed over the incisions. The procedure may vary among health care providers and hospitals.      What happens after the procedure?  Your blood pressure, heart rate, breathing rate, and blood oxygen level will be monitored until you leave the hospital.  You will be given medicine to help with pain, nausea, and vomiting as needed.  You may have vaginal discharge after the procedure. You may need to wear a sanitary napkin.  If you were given a sedative during the procedure, it can affect you for several hours. Do not drive or operate machinery until your health care provider says that it is safe. Summary  Laparoscopic tubal ligation is a procedure that is done to prevent pregnancy.  You should not have this procedure if you want to get pregnant someday or if you are unsure about having more children.  The procedure is done using a thin, lighted tube (laparoscope) with a camera attached that will be inserted into your abdomen through an incision.  After the procedure you will be given medicine to help with pain, nausea, and vomiting as needed.  Plan to have someone take you home from the hospital. This information is not intended to replace advice given to you by your health care provider. Make sure you discuss any questions you have with your health care provider. Document Revised: 05/24/2020 Document Reviewed: 05/24/2020 Elsevier Patient Education  2021 Elsevier  Inc.  

## 2020-10-18 NOTE — Progress Notes (Signed)
Pt present to discuss tubal ligation or another form of birth control method.

## 2020-10-18 NOTE — Progress Notes (Signed)
   GYNECOLOGY CLINIC PROGRESS NOTE Subjective:    Vanessa Franco is a 40 y.o. G17P1021 female who presents for contraception counseling.  She notes that she would like to discuss permanent sterilization with BTL. She is currently on Depo Provera for contraception, but having irregular bleeding and experiencing hair loss. The patient has no complaints today. The patient is sexually active.     The following portions of the patient's history were reviewed and updated as appropriate: allergies, current medications, past family history, past medical history, past social history, past surgical history and problem list.  Review of Systems Pertinent items noted in HPI and remainder of comprehensive ROS otherwise negative.   Objective:    BP 116/77   Pulse 90   Ht 5\' 7"  (1.702 m)   Wt 195 lb 6.4 oz (88.6 kg)   BMI 30.60 kg/m  General appearance: alert and no distress See H&P for remainder of exam.   Assessment:   1. Encounter for tubal ligation   2. Depo-Provera contraceptive status    Plan:   Patient desires permanent sterilization.  Other reversible forms of contraception were discussed with patient; she declines all other modalities. Risks of procedure discussed with patient including but not limited to: risk of regret, permanence of method, bleeding, infection, injury to surrounding organs and need for additional procedures.  Failure risk of about 1% with increased risk of ectopic gestation if pregnancy occurs was also discussed with patient.  Also discussed possibility of post-tubal pain syndrome. Patient verbalized understanding of these risks and wants to proceed with sterilization.  Signed Medicaid consent for sterilization.  Advised that form will become active after 30 days.  Will tentatively schedule patient for surgery (laparoscopic BTL) on 11/25/2020.    A total of 15 minutes were spent face-to-face with the patient during this encounter and over half of that time dealt with  counseling and coordination of care.   [name], MD [facility]

## 2020-11-18 ENCOUNTER — Inpatient Hospital Stay: Admission: RE | Admit: 2020-11-18 | Payer: Medicaid Other | Source: Ambulatory Visit

## 2020-11-21 ENCOUNTER — Other Ambulatory Visit
Admission: RE | Admit: 2020-11-21 | Payer: Medicaid Other | Source: Ambulatory Visit | Attending: Obstetrics and Gynecology | Admitting: Obstetrics and Gynecology

## 2020-11-22 ENCOUNTER — Other Ambulatory Visit
Admission: RE | Admit: 2020-11-22 | Discharge: 2020-11-22 | Disposition: A | Discharge status: Home / Self Care | Payer: Medicaid Other | Source: Ambulatory Visit | Attending: Obstetrics and Gynecology | Admitting: Obstetrics and Gynecology

## 2020-11-22 ENCOUNTER — Other Ambulatory Visit: Payer: Self-pay

## 2020-11-22 DIAGNOSIS — Z01812 Encounter for preprocedural laboratory examination: Secondary | ICD-10-CM | POA: Insufficient documentation

## 2020-11-22 DIAGNOSIS — Z20822 Contact with and (suspected) exposure to covid-19: Secondary | ICD-10-CM | POA: Insufficient documentation

## 2020-11-22 LAB — SARS CORONAVIRUS 2 (TAT 6-24 HRS): SARS Coronavirus 2: NEGATIVE

## 2020-11-24 MED ORDER — LACTATED RINGERS IV SOLN: INTRAVENOUS | Status: DC

## 2020-11-24 MED ORDER — POVIDONE-IODINE 10 % EX SWAB
2.0000 "application " | Freq: Once | CUTANEOUS | Status: AC
  Administered 2020-11-25: 2 via TOPICAL

## 2020-11-24 MED ORDER — CHLORHEXIDINE GLUCONATE 0.12 % MT SOLN
15.0000 mL | Freq: Once | OROMUCOSAL | Status: AC
  Administered 2020-11-25: 15 mL via OROMUCOSAL

## 2020-11-24 MED ORDER — ORAL CARE MOUTH RINSE: 15.0000 mL | Freq: Once | OROMUCOSAL | Status: AC

## 2020-11-25 ENCOUNTER — Ambulatory Visit
Admission: RE | Admit: 2020-11-25 | Discharge: 2020-11-25 | Disposition: A | Discharge status: Home / Self Care | Payer: Medicaid Other | Attending: Obstetrics and Gynecology | Admitting: Obstetrics and Gynecology

## 2020-11-25 ENCOUNTER — Telehealth: Payer: Self-pay | Admitting: Obstetrics and Gynecology

## 2020-11-25 ENCOUNTER — Encounter
Admission: RE | Disposition: A | Discharge status: Home / Self Care | Payer: Self-pay | Source: Home / Self Care | Attending: Obstetrics and Gynecology

## 2020-11-25 ENCOUNTER — Ambulatory Visit: Payer: Medicaid Other | Admitting: Certified Registered Nurse Anesthetist

## 2020-11-25 ENCOUNTER — Ambulatory Visit
Admission: RE | Admit: 2020-11-25 | Payer: Medicaid Other | Source: Home / Self Care | Admitting: Obstetrics and Gynecology

## 2020-11-25 ENCOUNTER — Encounter: Admission: RE | Payer: Self-pay | Source: Home / Self Care

## 2020-11-25 ENCOUNTER — Other Ambulatory Visit: Payer: Self-pay

## 2020-11-25 ENCOUNTER — Encounter: Payer: Self-pay | Admitting: Obstetrics and Gynecology

## 2020-11-25 DIAGNOSIS — Z302 Encounter for sterilization: Secondary | ICD-10-CM | POA: Diagnosis not present

## 2020-11-25 DIAGNOSIS — I10 Essential (primary) hypertension: Secondary | ICD-10-CM | POA: Diagnosis not present

## 2020-11-25 DIAGNOSIS — Z793 Long term (current) use of hormonal contraceptives: Secondary | ICD-10-CM | POA: Insufficient documentation

## 2020-11-25 DIAGNOSIS — Z79899 Other long term (current) drug therapy: Secondary | ICD-10-CM | POA: Insufficient documentation

## 2020-11-25 DIAGNOSIS — F17210 Nicotine dependence, cigarettes, uncomplicated: Secondary | ICD-10-CM | POA: Insufficient documentation

## 2020-11-25 HISTORY — PX: LAPAROSCOPIC TUBAL LIGATION: SHX1937

## 2020-11-25 LAB — CBC
HCT: 39.6 % (ref 36.0–46.0)
Hemoglobin: 13.7 g/dL (ref 12.0–15.0)
MCH: 31.5 pg (ref 26.0–34.0)
MCHC: 34.6 g/dL (ref 30.0–36.0)
MCV: 91 fL (ref 80.0–100.0)
Platelets: 251 10*3/uL (ref 150–400)
RBC: 4.35 MIL/uL (ref 3.87–5.11)
RDW: 12.3 % (ref 11.5–15.5)
WBC: 12.6 10*3/uL — ABNORMAL HIGH (ref 4.0–10.5)
nRBC: 0 % (ref 0.0–0.2)

## 2020-11-25 LAB — POCT PREGNANCY, URINE: Preg Test, Ur: NEGATIVE

## 2020-11-25 SURGERY — LIGATION, FALLOPIAN TUBE, LAPAROSCOPIC
Anesthesia: General | Laterality: Bilateral

## 2020-11-25 MED ORDER — PROPOFOL 10 MG/ML IV BOLUS
INTRAVENOUS | Status: DC | PRN
  Administered 2020-11-25: 150 mg via INTRAVENOUS

## 2020-11-25 MED ORDER — MIDAZOLAM HCL 2 MG/2ML IJ SOLN
INTRAMUSCULAR | Status: AC
  Filled 2020-11-25: qty 2

## 2020-11-25 MED ORDER — SUGAMMADEX SODIUM 200 MG/2ML IV SOLN
INTRAVENOUS | Status: DC | PRN
  Administered 2020-11-25: 200 mg via INTRAVENOUS

## 2020-11-25 MED ORDER — LIDOCAINE HCL (CARDIAC) PF 100 MG/5ML IV SOSY
PREFILLED_SYRINGE | INTRAVENOUS | Status: DC | PRN
  Administered 2020-11-25: 80 mg via INTRAVENOUS

## 2020-11-25 MED ORDER — OXYCODONE HCL 5 MG PO TABS
5.0000 mg | ORAL_TABLET | Freq: Once | ORAL | Status: AC | PRN
  Administered 2020-11-25: 5 mg via ORAL

## 2020-11-25 MED ORDER — OXYCODONE HCL 5 MG PO TABS
ORAL_TABLET | ORAL | Status: AC
  Filled 2020-11-25: qty 1

## 2020-11-25 MED ORDER — DEXAMETHASONE SODIUM PHOSPHATE 10 MG/ML IJ SOLN
INTRAMUSCULAR | Status: DC | PRN
  Administered 2020-11-25: 10 mg via INTRAVENOUS

## 2020-11-25 MED ORDER — FENTANYL CITRATE (PF) 100 MCG/2ML IJ SOLN
25.0000 ug | INTRAMUSCULAR | Status: DC | PRN
Start: 2020-11-25 — End: 2020-11-25
  Administered 2020-11-25: 50 ug via INTRAVENOUS

## 2020-11-25 MED ORDER — FENTANYL CITRATE (PF) 100 MCG/2ML IJ SOLN
INTRAMUSCULAR | Status: AC
  Filled 2020-11-25: qty 2

## 2020-11-25 MED ORDER — MIDAZOLAM HCL 2 MG/2ML IJ SOLN
INTRAMUSCULAR | Status: DC | PRN
  Administered 2020-11-25: 2 mg via INTRAVENOUS

## 2020-11-25 MED ORDER — FENTANYL CITRATE (PF) 100 MCG/2ML IJ SOLN
INTRAMUSCULAR | Status: AC
  Administered 2020-11-25: 50 ug via INTRAVENOUS
  Filled 2020-11-25: qty 2

## 2020-11-25 MED ORDER — FENTANYL CITRATE (PF) 100 MCG/2ML IJ SOLN
INTRAMUSCULAR | Status: DC | PRN
  Administered 2020-11-25: 100 ug via INTRAVENOUS

## 2020-11-25 MED ORDER — OXYCODONE-ACETAMINOPHEN 5-325 MG PO TABS: 1.0000 | ORAL_TABLET | Freq: Four times a day (QID) | ORAL | 0 refills | Status: DC | PRN

## 2020-11-25 MED ORDER — ROCURONIUM BROMIDE 100 MG/10ML IV SOLN
INTRAVENOUS | Status: DC | PRN
  Administered 2020-11-25: 50 mg via INTRAVENOUS

## 2020-11-25 MED ORDER — BUPIVACAINE HCL 0.5 % IJ SOLN
INTRAMUSCULAR | Status: DC | PRN
  Administered 2020-11-25: 10 mL

## 2020-11-25 MED ORDER — ONDANSETRON HCL 4 MG/2ML IJ SOLN
INTRAMUSCULAR | Status: DC | PRN
  Administered 2020-11-25: 4 mg via INTRAVENOUS

## 2020-11-25 MED ORDER — MEPERIDINE HCL 50 MG/ML IJ SOLN: 6.2500 mg | INTRAMUSCULAR | Status: DC | PRN

## 2020-11-25 MED ORDER — OXYCODONE HCL 5 MG/5ML PO SOLN: 5.0000 mg | Freq: Once | ORAL | Status: AC | PRN

## 2020-11-25 MED ORDER — CHLORHEXIDINE GLUCONATE 0.12 % MT SOLN
OROMUCOSAL | Status: AC
  Filled 2020-11-25: qty 15

## 2020-11-25 MED ORDER — PROMETHAZINE HCL 25 MG/ML IJ SOLN
6.2500 mg | INTRAMUSCULAR | Status: DC | PRN
Start: 2020-11-25 — End: 2020-11-25

## 2020-11-25 MED ORDER — PROPOFOL 10 MG/ML IV BOLUS
INTRAVENOUS | Status: AC
  Filled 2020-11-25: qty 20

## 2020-11-25 SURGICAL SUPPLY — 28 items
ADH SKN CLS APL DERMABOND .7 (GAUZE/BANDAGES/DRESSINGS) ×1
APL PRP STRL LF DISP 70% ISPRP (MISCELLANEOUS) ×1
BLADE SURG SZ11 CARB STEEL (BLADE) ×2 IMPLANT
CATH ROBINSON RED A/P 16FR (CATHETERS) ×2 IMPLANT
CHLORAPREP W/TINT 26 (MISCELLANEOUS) ×2 IMPLANT
COVER WAND RF STERILE (DRAPES) IMPLANT
DERMABOND ADVANCED (GAUZE/BANDAGES/DRESSINGS) ×1
DERMABOND ADVANCED .7 DNX12 (GAUZE/BANDAGES/DRESSINGS) ×1 IMPLANT
GLOVE SURG ENC MOIS LTX SZ6.5 (GLOVE) ×6 IMPLANT
GLOVE SURG UNDER LTX SZ7 (GLOVE) ×6 IMPLANT
GOWN STRL REUS W/ TWL LRG LVL3 (GOWN DISPOSABLE) ×3 IMPLANT
GOWN STRL REUS W/TWL LRG LVL3 (GOWN DISPOSABLE) ×6
KIT PINK PAD W/HEAD ARE REST (MISCELLANEOUS) ×2
KIT PINK PAD W/HEAD ARM REST (MISCELLANEOUS) ×1 IMPLANT
KIT TURNOVER CYSTO (KITS) ×2 IMPLANT
LABEL OR SOLS (LABEL) IMPLANT
MANIFOLD NEPTUNE II (INSTRUMENTS) ×2 IMPLANT
NS IRRIG 500ML POUR BTL (IV SOLUTION) ×2 IMPLANT
PACK GYN LAPAROSCOPIC (MISCELLANEOUS) ×2 IMPLANT
PAD OB MATERNITY 4.3X12.25 (PERSONAL CARE ITEMS) ×2 IMPLANT
PAD PREP 24X41 OB/GYN DISP (PERSONAL CARE ITEMS) ×2 IMPLANT
SET TUBE SMOKE EVAC HIGH FLOW (TUBING) ×2 IMPLANT
SHEARS ENDO 5MM LNG  ASK BEFOR (MISCELLANEOUS) ×1
SHEARS ENDO 5MM LNG ASK BEFOR (MISCELLANEOUS) ×1 IMPLANT
SUT VIC AB 0 CT2 27 (SUTURE) ×2 IMPLANT
SUT VIC AB 4-0 SH 27 (SUTURE) ×2
SUT VIC AB 4-0 SH 27XANBCTRL (SUTURE) ×1 IMPLANT
TROCAR ENDO BLADELESS 11MM (ENDOMECHANICALS) ×2 IMPLANT

## 2020-11-25 NOTE — Telephone Encounter (Signed)
[facility] called about narcotic rx asking for adjustment of how it is written to be max of 6 per day.

## 2020-11-25 NOTE — Telephone Encounter (Signed)
I changed the prescription to include information requested by pharmacy.

## 2020-11-25 NOTE — Discharge Instructions (Addendum)
Postpartum Tubal Ligation, Care After The following information offers guidance on how to care for yourself after your procedure. Your health care provider may also give you more specific instructions. If you have problems or questions, contact your health care provider. What can I expect after the procedure? After the procedure, you may have:  A sore throat if general anesthesia was used.  Bruising or pain in your back.  Nausea or vomiting.  Dizziness.  Mild abdominal discomfort or pain, such as cramping, gas pain, or feeling bloated.  Soreness around the incision area.  Tiredness.  Pain in your shoulders. This is caused by the gas that was used during the procedure. Follow these instructions at home: Medicines  Ask your health care provider if the medicine prescribed to you: ? Requires you to avoid driving or using heavy machinery. ? Can cause constipation. You may need to take these actions to prevent or treat constipation:  Drink enough fluid to keep your urine pale yellow.  Take over-the-counter or prescription medicines.  Eat foods that are high in fiber, such as beans, whole grains, and fresh fruits and vegetables.  Limit foods that are high in fat and processed sugars, such as fried or sweet foods.  Do not take aspirin because it can cause bleeding. Activity  Rest for the remainder of the day.  Avoid sitting for a long time without moving. Get up to take short walks every 1-2 hours. This is important to improve blood flow and breathing. Ask for help if you feel weak or unsteady.  Do not have sex, douche, or put a tampon or anything else in your vagina for 6 weeks or as long as told by your health care provider.  Do not lift anything that is heavier than your baby for 2 weeks, or the limit that you are told, until your health care provider says that it is safe.  Return to your normal activities as told by your health care provider. Ask your health care provider  what activities are safe for you. Incision care  Follow instructions from your health care provider about how to take care of your incision. Make sure you: ? Wash your hands with soap and water for at least 20 seconds before and after you change your bandage (dressing). If soap and water are not available, use hand sanitizer. ? Change your dressing as told by your health care provider. ? Leave stitches (sutures), skin glue, or adhesive strips in place. These skin closures may need to stay in place for 2 weeks or longer. If adhesive strip edges start to loosen and curl up, you may trim the loose edges. Do not remove adhesive strips completely unless your health care provider tells you to do that.  Check your incision area every day for signs of infection. Check for: ? Redness, swelling, or more pain. ? Fluid or blood. ? Warmth. ? Pus or a bad smell.      Other Instructions  Do not take baths, swim, or use a hot tub until your health care provider approves. Ask your health care provider if you may take showers. You may only be allowed to take sponge baths.  Keep all follow-up visits. This is important. Contact a health care provider if:  You have signs of infection, such as: ? Redness, swelling, or more pain around your incision. ? Fluid or blood coming from your incision. ? Your incision feels warm to the touch. ? You have pus or a bad smell coming  from your incision.  Your pain does not improve after 2-3 days.  The edges of your incision break open after the sutures have been removed.  You have a rash.  You repeatedly become dizzy or lightheaded.  You have pain and pain medicine is not helping.  You are constipated. Get help right away if:  You have a fever or chills.  You faint.  You have pain in your abdomen that gets worse, and the pain is not relieved by pain medicine.  You have shortness of breath or trouble breathing.  You have chest pain, leg pain, or leg  swelling.  You have ongoing nausea or diarrhea. These symptoms may represent a serious problem that is an emergency. Do not wait to see if the symptoms will go away. Get medical help right away. Call your local emergency services (911 in the [place] Do not drive yourself to the hospital. Summary  Mild abdominal discomfort is common after this procedure.  Contact your health care provider if you experience problems or have concerns.  Do not lift anything that is heavier than your baby for 2 weeks, or the limit that you are told, until your health care provider says that it is safe.  Keep all follow-up visits. This is important. This information is not intended to replace advice given to you by your health care provider. Make sure you discuss any questions you have with your health care provider. Document Revised: 05/24/2020 Document Reviewed: 05/24/2020 Elsevier Patient Education  2021 [place]   1) The drugs that you were given will stay in your system until tomorrow so for the next 24 hours you should not:  A) Drive an automobile B) Make any legal decisions C) Drink any alcoholic beverage   2) You may resume regular meals tomorrow.  Today it is better to start with liquids and gradually work up to solid foods.  You may eat anything you prefer, but it is better to start with liquids, then soup and crackers, and gradually work up to solid foods.   3) Please notify your doctor immediately if you have any unusual bleeding, trouble breathing, redness and pain at the surgery site, drainage, fever, or pain not relieved by medication.    4) Additional Instructions:        Please contact your physician with any problems or Same Day Surgery at [phone], Monday through Friday 6 am to 4 pm, or [place] at [facility] number at [phone].

## 2020-11-25 NOTE — Anesthesia Preprocedure Evaluation (Signed)
Anesthesia Evaluation  Patient identified by MRN, date of birth, ID band Patient awake    Reviewed: Allergy & Precautions, NPO status , Patient's Chart, lab work & pertinent test results  History of Anesthesia Complications Negative for: history of anesthetic complications  Airway Mallampati: II  TM Distance: >3 FB Neck ROM: Full    Dental  (+) Poor Dentition, Missing   Pulmonary neg sleep apnea, neg COPD, Current SmokerPatient did not abstain from smoking.,    breath sounds clear to auscultation- rhonchi (-) wheezing      Cardiovascular Exercise Tolerance: Good hypertension, Pt. on medications (-) CAD, (-) Past MI, (-) Cardiac Stents and (-) CABG  Rhythm:Regular Rate:Normal - Systolic murmurs and - Diastolic murmurs    Neuro/Psych neg Seizures PSYCHIATRIC DISORDERS Anxiety Depression negative neurological ROS     GI/Hepatic negative GI ROS, Neg liver ROS,   Endo/Other  neg diabetes  Renal/GU negative Renal ROS     Musculoskeletal negative musculoskeletal ROS (+)   Abdominal (+) + obese,   Peds  Hematology negative hematology ROS (+)   Anesthesia Other Findings Past Medical History: No date: Anxiety 09/13/2020: COVID-19 virus infection No date: Depression No date: Fibroid No date: Gestational diabetes No date: History of D&C 11/2013: History of uterine fibroid     Comment:  [facility]   Reproductive/Obstetrics                            Anesthesia Physical Anesthesia Plan  ASA: II  Anesthesia Plan: General   Post-op Pain Management:    Induction: Intravenous  PONV Risk Score and Plan: 1 and Ondansetron and Midazolam  Airway Management Planned: Oral ETT  Additional Equipment:   Intra-op Plan:   Post-operative Plan: Extubation in OR  Informed Consent: I have reviewed the patients History and Physical, chart, labs and discussed the procedure including the risks, benefits and  alternatives for the proposed anesthesia with the patient or authorized representative who has indicated his/her understanding and acceptance.     Dental advisory given  Plan Discussed with: CRNA and Anesthesiologist  Anesthesia Plan Comments:         Anesthesia Quick Evaluation

## 2020-11-25 NOTE — H&P (Signed)
GYNECOLOGY PREOPERATIVE HISTORY AND PHYSICAL   Subjective:  Vanessa Franco is a 40 y.o. [id number] here for surgical management of undesired fertility.   No significant preoperative concerns.  Proposed surgery: Laparoscopic bilateral tubal ligation    Pertinent Gynecological History: Menses: flow is moderate and regular every month without intermenstrual spotting Contraception: Depo-Provera injections (but discontinued due to side effects) Last pap: normal Date: 02/2017   Past Medical History:  Diagnosis Date  . Anxiety   . Chronic hypertension 06/23/2019  . COVID-19 virus infection 09/13/2020  . Depression   . Fibroid   . Gestational diabetes   . History of D&C   . History of uterine fibroid 11/2013   [facility]   Past Surgical History:  Procedure Laterality Date  . DILATION AND CURETTAGE OF UTERUS    . MYOMECTOMY  2015   Hysteroscopic    OB History  Gravida Para Term Preterm AB Living  4 1 1   2 1   SAB IAB Ectopic Multiple Live Births  2     0 1    # Outcome Date GA Lbr Len/2nd Weight Sex Delivery Anes PTL Lv  4 Gravida           3 Term 08/16/16 [redacted]w[redacted]d 09:15 / 01:20 2720 g F Vag-Spont EPI  LIV  2 SAB 2002        FD  1 SAB 2001        FD    Family History  Problem Relation Age of Onset  . Diabetes Father   . Kidney failure Father   . Heart failure Father   . Rheum arthritis Mother   . Hypertension Mother     Social History   Socioeconomic History  . Marital status: Single    Spouse name: Not on file  . Number of children: Not on file  . Years of education: Not on file  . Highest education level: Not on file  Occupational History  . Occupation: Scientist, clinical (histocompatibility and immunogenetics): [employer]  Tobacco Use  . Smoking status: Current Some Day Smoker    Packs/day: 0.25  . Smokeless tobacco: Never Used  Vaping Use  . Vaping Use: Never used  Substance and Sexual Activity  . Alcohol use: Not Currently    Alcohol/week: 0.0 standard drinks    Comment: occass   . Drug use: Not Currently    Types: Marijuana  . Sexual activity: Yes    Partners: Male    Birth control/protection: Injection  Other Topics Concern  . Not on file  Social History Narrative  . Not on file   Social Determinants of Health   Financial Resource Strain: Not on file  Food Insecurity: Not on file  Transportation Needs: Not on file  Physical Activity: Not on file  Stress: Not on file  Social Connections: Not on file  Intimate Partner Violence: Not on file    No current facility-administered medications on file prior to encounter.   Current Outpatient Medications on File Prior to Encounter  Medication Sig Dispense Refill  . acetaminophen (TYLENOL) 500 MG tablet Take 1,000 mg by mouth every 6 (six) hours as needed for moderate pain or headache.    . albuterol (VENTOLIN HFA) 108 (90 Base) MCG/ACT inhaler Inhale 2 puffs into the lungs every 6 (six) hours as needed for shortness of breath. 6.7 g 0  . amLODipine (NORVASC) 10 MG tablet Take 10 mg by mouth daily.    Marland Kitchen BIOTIN PO Take  1 tablet by mouth daily.    Marland Kitchen ibuprofen (ADVIL) 800 MG tablet Take 1 tablet (800 mg total) by mouth every 8 (eight) hours as needed. (Patient taking differently: Take 800 mg by mouth every 8 (eight) hours as needed for headache or moderate pain.) 60 tablet 1  . Multiple Vitamin (MULTIVITAMIN WITH MINERALS) TABS tablet Take 1 tablet by mouth daily.    . naproxen sodium (ALEVE) 220 MG tablet Take 220 mg by mouth daily as needed (pain).    . medroxyPROGESTERone (DEPO-PROVERA) 150 MG/ML injection Inject 1 mL (150 mg total) into the muscle every 3 (three) months. (Patient not taking: No sig reported) 1 mL 3   Allergies  Allergen Reactions  . Ketorolac Other (See Comments)    Swelling, water retention  . Sulfa Antibiotics Rash      Review of Systems Constitutional: No recent fever/chills/sweats Respiratory: No recent cough/bronchitis Cardiovascular: No chest pain Gastrointestinal: No recent  nausea/vomiting/diarrhea Genitourinary: No UTI symptoms Hematologic/lymphatic:No history of coagulopathy or recent blood thinner use    Objective:   Blood pressure (!) 151/96, pulse (!) 101, temperature 97.9 F (36.6 C), temperature source Oral, resp. rate 18, height 5\' 7"  (1.702 m), weight 87.1 kg, SpO2 99 %, unknown if currently breastfeeding. CONSTITUTIONAL: Well-developed, well-nourished female in no acute distress.  HENT:  Normocephalic, atraumatic, External right and left ear normal. Oropharynx is clear and moist EYES: Conjunctivae and EOM are normal. Pupils are equal, round, and reactive to light. No scleral icterus.  NECK: Normal range of motion, supple, no masses SKIN: Skin is warm and dry. No rash noted. Not diaphoretic. No erythema. No pallor. NEUROLOGIC: Alert and oriented to person, place, and time. Normal reflexes, muscle tone coordination. No cranial nerve deficit noted. PSYCHIATRIC: Normal mood and affect. Normal behavior. Normal judgment and thought content. CARDIOVASCULAR: Normal heart rate noted, regular rhythm RESPIRATORY: Effort and breath sounds normal, no problems with respiration noted ABDOMEN: Soft, nontender, nondistended. PELVIC: Deferred MUSCULOSKELETAL: Normal range of motion. No edema and no tenderness. 2+ distal pulses.    Labs: Results for orders placed or performed during the hospital encounter of 11/25/20 (from the past 336 hour(s))  Pregnancy, urine POC   Collection Time: 11/25/20  7:43 AM  Result Value Ref Range   Preg Test, Ur NEGATIVE NEGATIVE  CBC   Collection Time: 11/25/20  7:57 AM  Result Value Ref Range   WBC 12.6 (H) 4.0 - 10.5 K/uL   RBC 4.35 3.87 - 5.11 MIL/uL   Hemoglobin 13.7 12.0 - 15.0 g/dL   HCT 39.6 36.0 - 46.0 %   MCV 91.0 80.0 - 100.0 fL   MCH 31.5 26.0 - 34.0 pg   MCHC 34.6 30.0 - 36.0 g/dL   RDW 12.3 11.5 - 15.5 %   Platelets 251 150 - 400 K/uL   nRBC 0.0 0.0 - 0.2 %  Results for orders placed or performed during the  hospital encounter of 11/22/20 (from the past 336 hour(s))  SARS CORONAVIRUS 2 (TAT 6-24 HRS) Nasopharyngeal Nasopharyngeal Swab   Collection Time: 11/22/20 10:04 AM   Specimen: Nasopharyngeal Swab  Result Value Ref Range   SARS Coronavirus 2 NEGATIVE NEGATIVE     Imaging Studies: No results found.  Assessment:    Undesired fertility Chronic Hypertension   Plan:   - Counseling: Patient desires permanent sterilization.  Other reversible forms of contraception were discussed with patient; she declines all other modalities. Risks of procedure discussed with patient including but not limited to: risk of regret,  permanence of method, bleeding, infection, injury to surrounding organs and need for additional procedures.  Failure risk of about 1% with increased risk of ectopic gestation if pregnancy occurs was also discussed with patient.  Also discussed possibility of post-tubal pain syndrome. Patient verbalized understanding of these risks and wants to proceed with sterilization.  Medicaid sterilization form in compliance.  To OR when ready. - Preop testing reviewed. - Patient has been NPO since midnight.  - Held BP meds this morning.    [name], MD [facility]

## 2020-11-25 NOTE — Transfer of Care (Signed)
Immediate Anesthesia Transfer of Care Note  Patient: Vanessa Franco  Procedure(s) Performed: LAPAROSCOPIC TUBAL LIGATION (Bilateral )  Patient Location: PACU  Anesthesia Type:General  Level of Consciousness: awake, alert  and oriented  Airway & Oxygen Therapy: Patient Spontanous Breathing and Patient connected to nasal cannula oxygen  Post-op Assessment: Report given to RN and Post -op Vital signs reviewed and stable  Post vital signs: Reviewed and stable  Last Vitals:  Vitals Value Taken Time  BP 143/89 11/25/20 1015  Temp 37.3 C 11/25/20 1015  Pulse 104 11/25/20 1018  Resp 18 11/25/20 1018  SpO2 100 % 11/25/20 1018  Vitals shown include unvalidated device data.  Last Pain:  Vitals:   11/25/20 0753  TempSrc: Oral  PainSc: 0-No pain         Complications: No complications documented.

## 2020-11-25 NOTE — Op Note (Signed)
Procedure(s): LAPAROSCOPIC TUBAL LIGATION Procedure Note  Vanessa Franco female 40 y.o. 11/25/2020  Indications: The patient is a 40 y.o. [phone] female with undesired fertility.   Pre-operative Diagnosis: Undesired fertility, chronic HTN  Post-operative Diagnosis: Same  Surgeon: [name], MD  Assistants:  None  Anesthesia: General endotracheal anesthesia  Findings: Uterus, ovaries, and fallopian tubes and ovaries appeared normal.   Procedure Details: The patient was seen in the Holding Room. The risks, benefits, complications, treatment options, and expected outcomes were discussed with the patient.  The patient concurred with the proposed plan, giving informed consent.  The site of surgery properly noted/marked. The patient was taken to the Operating Room, identified as Vanessa Franco and the procedure verified as Procedure(s) (LRB): LAPAROSCOPIC TUBAL LIGATION (Bilateral). A Time Out was held and the above information confirmed.  She was then placed under general anesthesia without difficulty. She was placed in the dorsal lithotomy position, and was prepped and draped in a sterile manner.  A straight catheterization was performed. A sterile speculum was inserted into the vagina and the cervix was grasped at the anterior lip using a single-toothed tenaculum. A Hulka clamp was placed for uterine manipulation.  The speculum and tenaculum were then removed. After an adequate timeout was performed, attention was then turned to the patient's abdomen where a 11-mm skin incision was made in the umbilical fold.  The Optiview 11-mm trocar and sleeve were then advanced without difficulty with the laparoscope under direct visualization into the abdomen.  The abdomen was then insufflated with carbon dioxide gas and adequate pneumoperitoneum was obtained.  A survey of the patient's pelvis and abdomen revealed entirely normal anatomy.   The fallopian tubes were observed and found to be normal  in appearance. Bipolar forceps was then advanced through the operative port and used to coagulate a 3 cm portion of the left tube in the mid isthmic area.  Good blanching and coagulation was noted at the site of the application.  There was no bleeding noted in the mesosalpinx.  A similar process was carried out on the right fallopian tube allowing for bilateral tubal sterilization.  Good hemostasis was noted overall. The instruments were then removed from the patient's abdomen and the fascial incision was repaired with 0 Vicryl, and the skin was closed with 4-0 Vicryl and Dermabond.  The incision was injected with 10 ml of 0.5% Marcaine. The uterine manipulator and the tenaculum were removed from the vagina without complications. The patient tolerated the procedure well.  Sponge, lap, and needle counts were correct times two.  The patient was then taken to the recovery room awake, extubated and in stable condition.  Estimated Blood Loss:  minimal      Drains: straight catheterization prior to procedure with  100 ml of clear urine         Total IV Fluids: 600 ml  Specimens: None         Implants: None         Complications:  None; patient tolerated the procedure well.         Disposition: PACU - hemodynamically stable.         Condition: stable   [name], MD [facility]

## 2020-11-25 NOTE — Anesthesia Postprocedure Evaluation (Signed)
Anesthesia Post Note  Patient: Cielle Aguila Arnott  Procedure(s) Performed: LAPAROSCOPIC TUBAL LIGATION (Bilateral )  Patient location during evaluation: PACU Anesthesia Type: General Level of consciousness: awake and alert and oriented Pain management: pain level controlled Vital Signs Assessment: post-procedure vital signs reviewed and stable Respiratory status: spontaneous breathing, nonlabored ventilation and respiratory function stable Cardiovascular status: blood pressure returned to baseline and stable Postop Assessment: no signs of nausea or vomiting Anesthetic complications: no   No complications documented.   Last Vitals:  Vitals:   11/25/20 1053 11/25/20 1104  BP:  (!) 143/87  Pulse:  95  Resp:  16  Temp: 37 C (!) 36.2 C  SpO2:  99%    Last Pain:  Vitals:   11/25/20 1104  TempSrc: Temporal  PainSc: 4                  Orestes Geiman

## 2020-11-25 NOTE — Anesthesia Procedure Notes (Signed)
Procedure Name: Intubation Date/Time: 11/25/2020 9:11 AM Performed by: Willette Alma, CRNA Pre-anesthesia Checklist: Patient identified, Patient being monitored, Timeout performed, Emergency Drugs available and Suction available Patient Re-evaluated:Patient Re-evaluated prior to induction Oxygen Delivery Method: Circle system utilized Preoxygenation: Pre-oxygenation with 100% oxygen Induction Type: IV induction Ventilation: Mask ventilation without difficulty Laryngoscope Size: McGraph and 4 Grade View: Grade I Tube type: Oral Tube size: 7.0 mm Number of attempts: 1 Airway Equipment and Method: Stylet Placement Confirmation: ETT inserted through vocal cords under direct vision,  positive ETCO2 and breath sounds checked- equal and bilateral Secured at: 21 cm Tube secured with: Tape Dental Injury: Teeth and Oropharynx as per pre-operative assessment

## 2020-11-26 ENCOUNTER — Other Ambulatory Visit: Payer: Self-pay | Admitting: Obstetrics and Gynecology

## 2020-11-27 ENCOUNTER — Encounter: Payer: Self-pay | Admitting: Obstetrics and Gynecology

## 2020-12-06 ENCOUNTER — Telehealth: Payer: Self-pay | Admitting: Obstetrics and Gynecology

## 2020-12-06 ENCOUNTER — Encounter: Payer: Medicaid Other | Admitting: Obstetrics and Gynecology

## 2020-12-06 NOTE — Telephone Encounter (Signed)
Ok. Thank you but that is a little too far out. Can we get her scheduled for sometime next week, even if it is a video-televisit?

## 2020-12-06 NOTE — Telephone Encounter (Signed)
Vanessa Franco called in and stated she couldn't make her Post-op appointment today.  I did reschedule her for 12/17/2020 at 3:45pm.

## 2020-12-06 NOTE — Telephone Encounter (Signed)
Ok. Thank you.

## 2020-12-06 NOTE — Telephone Encounter (Signed)
I called Vanessa Franco back and she stated she would have to speak with her employer because she is due to go back to work as to why she cancelled today.  I offered her 2 different appointments for next week.  As soon as patient calls back to let me know if her employer will work with her, I will schedule accordingly.  I did stress that we really needed to see her sooner. I offered the tele-visit as well, and it was back to being a work conflict.

## 2020-12-10 ENCOUNTER — Encounter: Payer: Self-pay | Admitting: Obstetrics and Gynecology

## 2020-12-10 ENCOUNTER — Other Ambulatory Visit: Payer: Self-pay

## 2020-12-10 ENCOUNTER — Ambulatory Visit (INDEPENDENT_AMBULATORY_CARE_PROVIDER_SITE_OTHER): Payer: Medicaid Other | Admitting: Obstetrics and Gynecology

## 2020-12-10 VITALS — BP 138/72 | HR 80 | Ht 67.0 in | Wt 190.9 lb

## 2020-12-10 DIAGNOSIS — N898 Other specified noninflammatory disorders of vagina: Secondary | ICD-10-CM

## 2020-12-10 DIAGNOSIS — Z9851 Tubal ligation status: Secondary | ICD-10-CM

## 2020-12-10 DIAGNOSIS — Z09 Encounter for follow-up examination after completed treatment for conditions other than malignant neoplasm: Secondary | ICD-10-CM

## 2020-12-10 MED ORDER — NUVESSA 1.3 % VA GEL: 1.0000 "application " | Freq: Once | VAGINAL | 2 refills | Status: AC

## 2020-12-10 NOTE — Progress Notes (Signed)
Pt present for post-op visit.

## 2020-12-10 NOTE — Progress Notes (Signed)
    OBSTETRICS/GYNECOLOGY POST-OPERATIVE CLINIC VISIT  Subjective:     Vanessa Franco is a 40 y.o. [phone] female who presents to the clinic 2 weeks status post bilateral tubal ligation  for requested sterilization. Eating a regular diet without difficulty.  The patient is not having any pain.   She also complains of a vaginal discharge with small amount of blood.  Denies itching or burning. Desires to have prescription for [facility] sent in just in case it develops into a BV infection as she has a history of these.   The following portions of the patient's history were reviewed and updated as appropriate: allergies, current medications, past family history, past medical history, past social history, past surgical history and problem list.  Review of Systems Pertinent items noted in HPI and remainder of comprehensive ROS otherwise negative.    Objective:    BP 138/72   Pulse 80   Ht 5\' 7"  (1.702 m)   Wt 190 lb 14.4 oz (86.6 kg)   BMI 29.90 kg/m  General:  alert and no distress  Abdomen: soft, bowel sounds active, non-tender  Incision:   healing well, no drainage, no erythema, no hernia, no seroma, no swelling, no dehiscence, incision well approximated    Pathology:  None  Assessment:   Doing well postoperatively.  S/p tubal ligation.  Vaginal discharge   Plan:   1. Continue any current medications. 2. Wound care discussed.  3. Operative findings again reviewed.  4. Activity restrictions: none.  5. Anticipated return to work: has already returned. 6. Will send in a prescription to [facility].  7. Follow up: 3 months for annual exam.    [name], MD [facility]

## 2020-12-16 ENCOUNTER — Telehealth: Payer: Self-pay | Admitting: Emergency Medicine

## 2020-12-16 NOTE — Telephone Encounter (Signed)
Vanessa Franco called in and states she had a Tubal on 11/25/2020.  Vanessa Franco wanted to speak to a nurse over some concerns she is having.  Vanessa Franco states she is bleeding heavily and passing clots and wants to know if this is normal.  Please advise.

## 2020-12-17 ENCOUNTER — Encounter: Payer: Medicaid Other | Admitting: Obstetrics and Gynecology

## 2020-12-17 NOTE — Telephone Encounter (Signed)
Please inform patient that first cycle after being under anesthesia can often be heavy or irregular. Also, as her body is coming off the Depo Provera, this can also cause dysfunctional bleeding. Continue to keep an eye out for for her next 1-2 cycles.  Her symptoms should ease over time, but if not, she should schedule an appointment.   [name], MD [facility]

## 2020-12-17 NOTE — Telephone Encounter (Signed)
Please advise. Thanks Andruw Battie 

## 2020-12-18 NOTE — Telephone Encounter (Signed)
Pt called no answer. LM via VM that I did not want to leave the message on the phone due to no name was on the phone. Pt was informed via VM that I would send the information from [facility] via mychart. Pt was advised that if she had any questions or concerns to please contact the office. Pt was also via VM that the information was added to this message to be given to her by front staff I was not available to speak to.    Please inform patient that first cycle after being under anesthesia can often be heavy or irregular. Also, as her body is coming off the Depo Provera, this can also cause dysfunctional bleeding. Continue to keep an eye out for for her next 1-2 cycles.  Her symptoms should ease over time, but if not, she should schedule an appointment.   [name], MD [facility]

## 2020-12-20 NOTE — Telephone Encounter (Signed)
Pt called no answer LM via VM that I had information given by [facility] concerning her call to the office. Informed pt that I did not feel comfortable leaving the information on her vm due to no name was stated on the vm. Informed pt via vm that I had send the message from [facility] via mychart. Pt was advised that if she had any problems or concerns to please contact the office.

## 2020-12-20 NOTE — Telephone Encounter (Signed)
Pt received and read mychart message.

## 2020-12-26 ENCOUNTER — Encounter: Payer: Self-pay | Admitting: Obstetrics and Gynecology

## 2021-01-17 ENCOUNTER — Telehealth: Payer: Self-pay | Admitting: Obstetrics and Gynecology

## 2021-01-17 NOTE — Telephone Encounter (Signed)
Loman Brooklyn called in from [employer].  Zigmund Daniel needs a sterilization form emailed to her from [place] surgery date 11/25/2020.  Please email form to:  [email or web address]

## 2021-02-19 ENCOUNTER — Telehealth: Payer: Self-pay | Admitting: Obstetrics and Gynecology

## 2021-02-19 NOTE — Telephone Encounter (Signed)
Patient called asking to speak with a nurse about a lump found in left breast/ Pt had tubes tied in March had a period in April  But has not had a cycle since. Pt states mild cramping and is very concerned. Please Advise.

## 2021-02-21 NOTE — Telephone Encounter (Signed)
Pt called no answer LM via VM that I had received her message and was going to keep an eye on the schedule for any cancellation to get her into the office to be seen earlier than 03/12/2021.

## 2021-03-11 NOTE — Patient Instructions (Signed)
Preventive Care 40-40 Years Old, Female Preventive care refers to lifestyle choices and visits with your health care provider that can promote health and wellness. This includes: A yearly physical exam. This is also called an annual wellness visit. Regular dental and eye exams. Immunizations. Screening for certain conditions. Healthy lifestyle choices, such as: Eating a healthy diet. Getting regular exercise. Not using drugs or products that contain nicotine and tobacco. Limiting alcohol use. What can I expect for my preventive care visit? Physical exam Your health care provider will check your: Height and weight. These may be used to calculate your BMI (body mass index). BMI is a measurement that tells if you are at a healthy weight. Heart rate and blood pressure. Body temperature. Skin for abnormal spots. Counseling Your health care provider may ask you questions about your: Past medical problems. Family's medical history. Alcohol, tobacco, and drug use. Emotional well-being. Home life and relationship well-being. Sexual activity. Diet, exercise, and sleep habits. Work and work Statistician. Access to firearms. Method of birth control. Menstrual cycle. Pregnancy history. What immunizations do I need?  Vaccines are usually given at various ages, according to a schedule. Your health care provider will recommend vaccines for you based on your age, medicalhistory, and lifestyle or other factors, such as travel or where you work. What tests do I need? Blood tests Lipid and cholesterol levels. These may be checked every 5 years, or more often if you are over 40 years old. Hepatitis C test. Hepatitis B test. Screening Lung cancer screening. You may have this screening every year starting at age 40 if you have a 30-pack-year history of smoking and currently smoke or have quit within the past 15 years. Colorectal cancer screening. All adults should have this screening starting at  age 23 and continuing until age 40. Your health care provider may recommend screening at age 40 if you are at increased risk. You will have tests every 1-10 years, depending on your results and the type of screening test. Diabetes screening. This is done by checking your blood sugar (glucose) after you have not eaten for a while (fasting). You may have this done every 1-3 years. Mammogram. This may be done every 1-2 years. Talk with your health care provider about when you should start having regular mammograms. This may depend on whether you have a family history of breast cancer. BRCA-related cancer screening. This may be done if you have a family history of breast, ovarian, tubal, or peritoneal cancers. Pelvic exam and Pap test. This may be done every 3 years starting at age 40. Starting at age 40, this may be done every 5 years if you have a Pap test in combination with an HPV test. Other tests STD (sexually transmitted disease) testing, if you are at risk. Bone density scan. This is done to screen for osteoporosis. You may have this scan if you are at high risk for osteoporosis. Talk with your health care provider about your test results, treatment options,and if necessary, the need for more tests. Follow these instructions at home: Eating and drinking  Eat a diet that includes fresh fruits and vegetables, whole grains, lean protein, and low-fat dairy products. Take vitamin and mineral supplements as recommended by your health care provider. Do not drink alcohol if: Your health care provider tells you not to drink. You are pregnant, may be pregnant, or are planning to become pregnant. If you drink alcohol: Limit how much you have to 0-1 drink a day. Be aware  of how much alcohol is in your drink. In the [place] one drink equals one 12 oz bottle of beer (355 mL), one 5 oz glass of wine (148 mL), or one 1 oz glass of hard liquor (44 mL).  Lifestyle Take daily care of your teeth and  gums. Brush your teeth every morning and night with fluoride toothpaste. Floss one time each day. Stay active. Exercise for at least 30 minutes 5 or more days each week. Do not use any products that contain nicotine or tobacco, such as cigarettes, e-cigarettes, and chewing tobacco. If you need help quitting, ask your health care provider. Do not use drugs. If you are sexually active, practice safe sex. Use a condom or other form of protection to prevent STIs (sexually transmitted infections). If you do not wish to become pregnant, use a form of birth control. If you plan to become pregnant, see your health care provider for a prepregnancy visit. If told by your health care provider, take low-dose aspirin daily starting at age 50. Find healthy ways to cope with stress, such as: Meditation, yoga, or listening to music. Journaling. Talking to a trusted person. Spending time with friends and family. Safety Always wear your seat belt while driving or riding in a vehicle. Do not drive: If you have been drinking alcohol. Do not ride with someone who has been drinking. When you are tired or distracted. While texting. Wear a helmet and other protective equipment during sports activities. If you have firearms in your house, make sure you follow all gun safety procedures. What's next? Visit your health care provider once a year for an annual wellness visit. Ask your health care provider how often you should have your eyes and teeth checked. Stay up to date on all vaccines. This information is not intended to replace advice given to you by your health care provider. Make sure you discuss any questions you have with your healthcare provider. Document Revised: 06/11/2020 Document Reviewed: 05/19/2018 Elsevier Patient Education  2022 Elsevier Inc. Breast Self-Awareness Breast self-awareness is knowing how your breasts look and feel. Doing breast self-awareness is important. It allows you to catch a  breast problem early while it is still small and can be treated. All women should do breast self-awareness, including women who have had breast implants. Tell your doctorif you notice a change in your breasts. What you need: A mirror. A well-lit room. How to do a breast self-exam A breast self-exam is one way to learn what is normal for your breasts and tocheck for changes. To do a breast self-exam: Look for changes  Take off all the clothes above your waist. Stand in front of a mirror in a room with good lighting. Put your hands on your hips. Push your hands down. Look at your breasts and nipples in the mirror to see if one breast or nipple looks different from the other. Check to see if: The shape of one breast is different. The size of one breast is different. There are wrinkles, dips, and bumps in one breast and not the other. Look at each breast for changes in the skin, such as: Redness. Scaly areas. Look for changes in your nipples, such as: Liquid around the nipples. Bleeding. Dimpling. Redness. A change in where the nipples are.  Feel for changes  Lie on your back on the floor. Feel each breast. To do this, follow these steps: Pick a breast to feel. Put the arm closest to that breast above your head.   Use your other arm to feel the nipple area of your breast. Feel the area with the pads of your three middle fingers by making small circles with your fingers. For the first circle, press lightly. For the second circle, press harder. For the third circle, press even harder. Keep making circles with your fingers at the different pressures as you move down your breast. Stop when you feel your ribs. Move your fingers a little toward the center of your body. Start making circles with your fingers again, this time going up until you reach your collarbone. Keep making up-and-down circles until you reach your armpit. Remember to keep using the three pressures. Feel the other breast  in the same way. Sit or stand in the tub or shower. With soapy water on your skin, feel each breast the same way you did in step 2 when you were lying on the floor.  Write down what you find Writing down what you find can help you remember what to tell your doctor. Write down: What is normal for each breast. Any changes you find in each breast, including: The kind of changes you find. Whether you have pain. Size and location of any lumps. When you last had your menstrual period. General tips Check your breasts every month. If you are breastfeeding, the best time to check your breasts is after you feed your baby or after you use a breast pump. If you get menstrual periods, the best time to check your breasts is 5-7 days after your menstrual period is over. With time, you will become comfortable with the self-exam, and you will begin to know if there are changes in your breasts. Contact a doctor if you: See a change in the shape or size of your breasts or nipples. See a change in the skin of your breast or nipples, such as red or scaly skin. Have fluid coming from your nipples that is not normal. Find a lump or thick area that was not there before. Have pain in your breasts. Have any concerns about your breast health. Summary Breast self-awareness includes looking for changes in your breasts, as well as feeling for changes within your breasts. Breast self-awareness should be done in front of a mirror in a well-lit room. You should check your breasts every month. If you get menstrual periods, the best time to check your breasts is 5-7 days after your menstrual period is over. Let your doctor know of any changes you see in your breasts, including changes in size, changes on the skin, pain or tenderness, or fluid from your nipples that is not normal. This information is not intended to replace advice given to you by your health care provider. Make sure you discuss any questions you have with  your healthcare provider. Document Revised: 04/26/2018 Document Reviewed: 04/26/2018 Elsevier Patient Education  2022 Elsevier Inc.  

## 2021-03-12 ENCOUNTER — Encounter: Payer: Self-pay | Admitting: Obstetrics and Gynecology

## 2021-03-12 ENCOUNTER — Other Ambulatory Visit: Payer: Self-pay

## 2021-03-12 ENCOUNTER — Other Ambulatory Visit (HOSPITAL_COMMUNITY)
Admission: RE | Admit: 2021-03-12 | Discharge: 2021-03-12 | Disposition: A | Discharge status: Home / Self Care | Payer: Medicaid Other | Source: Ambulatory Visit | Attending: Obstetrics and Gynecology | Admitting: Obstetrics and Gynecology

## 2021-03-12 ENCOUNTER — Ambulatory Visit (INDEPENDENT_AMBULATORY_CARE_PROVIDER_SITE_OTHER): Payer: Medicaid Other | Admitting: Obstetrics and Gynecology

## 2021-03-12 VITALS — BP 153/95 | HR 82 | Ht 67.0 in | Wt 192.0 lb

## 2021-03-12 DIAGNOSIS — I10 Essential (primary) hypertension: Secondary | ICD-10-CM

## 2021-03-12 DIAGNOSIS — Z124 Encounter for screening for malignant neoplasm of cervix: Secondary | ICD-10-CM

## 2021-03-12 DIAGNOSIS — Z13220 Encounter for screening for lipoid disorders: Secondary | ICD-10-CM

## 2021-03-12 DIAGNOSIS — N926 Irregular menstruation, unspecified: Secondary | ICD-10-CM

## 2021-03-12 DIAGNOSIS — Z131 Encounter for screening for diabetes mellitus: Secondary | ICD-10-CM

## 2021-03-12 DIAGNOSIS — Z1231 Encounter for screening mammogram for malignant neoplasm of breast: Secondary | ICD-10-CM

## 2021-03-12 DIAGNOSIS — R519 Headache, unspecified: Secondary | ICD-10-CM

## 2021-03-12 DIAGNOSIS — Z0000 Encounter for general adult medical examination without abnormal findings: Secondary | ICD-10-CM

## 2021-03-12 DIAGNOSIS — R1033 Periumbilical pain: Secondary | ICD-10-CM

## 2021-03-12 DIAGNOSIS — R102 Pelvic and perineal pain: Secondary | ICD-10-CM

## 2021-03-12 DIAGNOSIS — Z01419 Encounter for gynecological examination (general) (routine) without abnormal findings: Secondary | ICD-10-CM | POA: Diagnosis not present

## 2021-03-12 DIAGNOSIS — Z1159 Encounter for screening for other viral diseases: Secondary | ICD-10-CM

## 2021-03-12 MED ORDER — HYDROXYZINE PAMOATE 50 MG PO CAPS: 50.0000 mg | ORAL_CAPSULE | Freq: Every evening | ORAL | 2 refills | Status: DC | PRN

## 2021-03-12 MED ORDER — METOPROLOL SUCCINATE ER 25 MG PO TB24
25.0000 mg | ORAL_TABLET | Freq: Every day | ORAL | 3 refills | Status: DC
Start: 2021-03-12 — End: 2021-12-09

## 2021-03-12 NOTE — Progress Notes (Signed)
Pt present for annual exam. Pt c/o tenderness in the navel area, right side temple tingling, right side breast lump, random abd cramping during and not on cycles,

## 2021-03-12 NOTE — Progress Notes (Signed)
GYNECOLOGY ANNUAL PHYSICAL EXAM PROGRESS NOTE  Subjective:    Vanessa Franco is a 40 y.o. G32P1021 female who presents for an annual exam. The patient is currently sexually active. The patient wears seatbelts: yes. The patient participates in regular exercise: no. Has the patient ever been transfused or tattooed?: no. The patient reports that there is not domestic violence in her life.    The patient has the following concerns today: Reports complaints of tenderness near her naval area. Has been ongoing since her tubal ligation surgery several months ago.  Reports complaints of right-sided tingling in the temporal region. Denies eye pain, headaches, vision changes.  Had noted a breast lump on the right that was present for several days, but has since resolved. Was tender.  Noting abdominal cramping during and outside of her menstrual cycles.     Gynecologic History Menarche age: 87 Patient's last menstrual period was 02/20/2021. Contraception: tubal ligation. History of STI's: Denies  Last Pap: 03/16/2017 . Results were: normal.  Denies h/o abnormal pap smears.   OB History  Gravida Para Term Preterm AB Living  4 2 2  0 2 2  SAB IAB Ectopic Multiple Live Births  2 0 0 0 2    # Outcome Date GA Lbr Len/2nd Weight Sex Delivery Anes PTL Lv  4 Term 02/11/20 [redacted]w[redacted]d  5 lb 15.6 oz (2.71 kg) M Vag-Spont EPI N LIV     Complications: Gestational diabetes mellitus (GDM) in childbirth, diet controlled, Chronic hypertension, Advanced maternal age in multigravida     Apgar1: 8  Apgar5: 9  3 Term 08/16/16 [redacted]w[redacted]d 09:15 / 01:20 5 lb 15.9 oz (2.72 kg) F Vag-Spont EPI  LIV     Name: Tat,GIRL Aleatha     Apgar1: 8  Apgar5: 9  2 SAB 2002        FD  1 SAB 2001        FD    Past Medical History:  Diagnosis Date   Anxiety    Chronic hypertension 06/23/2019   COVID-19 virus infection 09/13/2020   Depression    Fibroid    Gestational diabetes    History of D&C    History of uterine  fibroid 11/2013   [facility]    Past Surgical History:  Procedure Laterality Date   DILATION AND CURETTAGE OF UTERUS     LAPAROSCOPIC TUBAL LIGATION Bilateral 11/25/2020   Procedure: LAPAROSCOPIC TUBAL LIGATION;  Surgeon: [name], MD;  Location: [facility] ORS;  Service: Gynecology;  Laterality: Bilateral;   MYOMECTOMY  2015   Hysteroscopic    Family History  Problem Relation Age of Onset   Diabetes Father    Kidney failure Father    Heart failure Father    Rheum arthritis Mother    Hypertension Mother     Social History   Socioeconomic History   Marital status: Single    Spouse name: Not on file   Number of children: Not on file   Years of education: Not on file   Highest education level: Not on file  Occupational History   Occupation: sales    Employer: [employer]  Tobacco Use   Smoking status: Some Days    Packs/day: 0.25    Pack years: 0.00    Types: Cigarettes   Smokeless tobacco: Never  Vaping Use   Vaping Use: Never used  Substance and Sexual Activity   Alcohol use: Not Currently    Alcohol/week: 0.0 standard drinks    Comment:  occass   Drug use: Not Currently    Types: Marijuana   Sexual activity: Yes    Partners: Male    Birth control/protection: Surgical  Other Topics Concern   Not on file  Social History Narrative   Not on file   Social Determinants of Health   Financial Resource Strain: Not on file  Food Insecurity: Not on file  Transportation Needs: Not on file  Physical Activity: Not on file  Stress: Not on file  Social Connections: Not on file  Intimate Partner Violence: Not on file    Current Outpatient Medications on File Prior to Visit  Medication Sig Dispense Refill   BIOTIN PO Take 1 tablet by mouth daily.     Multiple Vitamin (MULTIVITAMIN WITH MINERALS) TABS tablet Take 1 tablet by mouth daily.     amLODipine (NORVASC) 10 MG tablet Take 10 mg by mouth daily. (Patient not taking: Reported on 03/12/2021)     No current  facility-administered medications on file prior to visit.    Allergies  Allergen Reactions   Ketorolac Other (See Comments)    Swelling, water retention   Sulfa Antibiotics Rash     Review of Systems Constitutional: negative for chills, fatigue, fevers and sweats Eyes: negative for irritation, redness and visual disturbance Ears, nose, mouth, throat, and face: negative for hearing loss, nasal congestion, snoring and tinnitus Respiratory: negative for asthma, cough, sputum Cardiovascular: negative for chest pain, dyspnea, exertional chest pressure/discomfort, irregular heart beat, palpitations and syncope Gastrointestinal: negative for abdominal pain, change in bowel habits, nausea and vomiting Genitourinary: Negative for abnormal menstrual periods, genital lesions, sexual problems and vaginal discharge, dysuria and urinary incontinence. Positive for pelvic cramping.  Integument/breast: negative for breast lump, breast tenderness and nipple discharge Hematologic/lymphatic: negative for bleeding and easy bruising Musculoskeletal:negative for back pain and muscle weakness Neurological: negative for dizziness, headaches, vertigo and weakness. Positive for temporal tingling.  Endocrine: negative for diabetic symptoms including polydipsia, polyuria and skin dryness Allergic/Immunologic: negative for hay fever and urticaria        Objective:  Blood pressure (!) 153/95, pulse 82, height 5\' 7"  (1.702 m), weight 192 lb (87.1 kg), last menstrual period 02/20/2021, unknown if currently breastfeeding. Body mass index is 30.07 kg/m.  General Appearance:    Alert, cooperative, no distress, appears stated age, mild obesity  Head:    Normocephalic, without obvious abnormality, atraumatic  Eyes:    PERRL, conjunctiva/corneas clear, EOM's intact, both eyes  Ears:    Normal external ear canals, both ears  Nose:   Nares normal, septum midline, mucosa normal, no drainage or sinus tenderness  Throat:    Lips, mucosa, and tongue normal; teeth and gums normal  Neck:   Supple, symmetrical, trachea midline, no adenopathy; thyroid: no enlargement/tenderness/nodules; no carotid bruit or JVD  Back:     Symmetric, no curvature, ROM normal, no CVA tenderness  Lungs:     Clear to auscultation bilaterally, respirations unlabored  Chest Wall:    No tenderness or deformity   Heart:    Regular rate and rhythm, S1 and S2 normal, no murmur, rub or gallop  Breast Exam:    No tenderness, masses, or nipple abnormality  Abdomen:     Soft, bowel sounds active all four quadrants, no masses, no organomegaly.  Mildly tender approximately 2-3 cm lateral to umbilicus.   Genitalia:    Pelvic:external genitalia normal, vagina without lesions or tenderness, rectovaginal septum normal. Residual vaginal cream in vault, no discernable discharge present.  Cervix normal in appearance, no cervical motion tenderness, no adnexal masses or tenderness.  Uterus normal size, shape, mobile, regular contours, nontender.  Rectal:    Normal external sphincter.  No hemorrhoids appreciated. Internal exam not done.   Extremities:   Extremities normal, atraumatic, no cyanosis or edema  Pulses:   2+ and symmetric all extremities  Skin:   Skin color, texture, turgor normal, no rashes or lesions  Lymph nodes:   Cervical, supraclavicular, and axillary nodes normal  Neurologic:   CNII-XII intact, normal strength, sensation and reflexes throughout    Labs:  Lab Results  Component Value Date   WBC 12.6 (H) 11/25/2020   HGB 13.7 11/25/2020   HCT 39.6 11/25/2020   MCV 91.0 11/25/2020   PLT 251 11/25/2020    Lab Results  Component Value Date   CREATININE 0.49 02/11/2020   BUN 11 02/11/2020   NA 136 02/11/2020   K 3.7 02/11/2020   CL 106 02/11/2020   CO2 23 02/11/2020    Lab Results  Component Value Date   ALT 7 02/11/2020   AST 15 02/11/2020   ALKPHOS 139 (H) 02/11/2020   BILITOT 1.0 02/11/2020    No results found for:  TSH   Assessment:    1. Encounter for well woman exam with routine gynecological exam   2. Pap smear for cervical cancer screening   3. Menstrual periods irregular   4. Essential hypertension   5. Need for hepatitis C screening test   6. Screening for diabetes mellitus   7. Screening for lipid disorders   8. Umbilical pain   9. Temporal pain   10. Pelvic cramping   11. Breast cancer screening by mammogram     Plan:    - Blood tests: see orders.  - Breast self exam technique reviewed and patient encouraged to perform self-exam monthly. - Contraception: tubal ligation - Discussed healthy lifestyle modifications. - Pap smear performed today. - Mammogram ordered, patient to begin breast cancer screening after October, beginning age 37. - Essential HTN, BPs usually labile, patient has intermittent spans of time when she takes her BP meds. Was prescribed Norvasc after the birth of her last child, took for a short time. Notes that she did not like the side effects. Can start on Metoprolol. May help with BPs as well as temporal tingling.  - Pelvic cramping,irregularity of her cycles (occasional skips a month). Patient previously on Depo Provera for contraception and management of her cycles, but since prior to her tubal ligation has been off medication.  May need to consider resumption of a contraceptive method for management.  Can also utilize NSAIDs or Tylenol prn.  - Temporal tingling, patient notes no prior h/o migraines, headaches. Currently is the only symptom (no eye pain, tearing, vision changes, jaw pain). Will see if still present once BPs are treated.  - Umbilical pain could be secondary to scar tissue from previous tubal ligation. Will likely improve with time. Can utilize ice packs, NSAIDs, Tylenol or pain patches as needed.  - COVID Vaccination status: declines.  - Follow up in 1 year for annual exam. F/u in 1 month for BP check.     [name], MD [facility]

## 2021-03-15 LAB — COMPREHENSIVE METABOLIC PANEL
ALT: 12 IU/L (ref 0–32)
AST: 14 IU/L (ref 0–40)
Albumin/Globulin Ratio: 1.9 (ref 1.2–2.2)
Albumin: 4.4 g/dL (ref 3.8–4.8)
Alkaline Phosphatase: 70 IU/L (ref 44–121)
BUN/Creatinine Ratio: 10 (ref 9–23)
BUN: 7 mg/dL (ref 6–20)
Bilirubin Total: 1 mg/dL (ref 0.0–1.2)
CO2: 22 mmol/L (ref 20–29)
Calcium: 9.2 mg/dL (ref 8.7–10.2)
Chloride: 98 mmol/L (ref 96–106)
Creatinine, Ser: 0.71 mg/dL (ref 0.57–1.00)
Globulin, Total: 2.3 g/dL (ref 1.5–4.5)
Glucose: 97 mg/dL (ref 65–99)
Potassium: 3.8 mmol/L (ref 3.5–5.2)
Sodium: 136 mmol/L (ref 134–144)
Total Protein: 6.7 g/dL (ref 6.0–8.5)
eGFR: 111 mL/min/{1.73_m2} (ref >59)

## 2021-03-15 LAB — CBC
Hematocrit: 41.6 % (ref 34.0–46.6)
Hemoglobin: 13.8 g/dL (ref 11.1–15.9)
MCH: 30.5 pg (ref 26.6–33.0)
MCHC: 33.2 g/dL (ref 31.5–35.7)
MCV: 92 fL (ref 79–97)
Platelets: 194 10*3/uL (ref 150–450)
RBC: 4.52 x10E6/uL (ref 3.77–5.28)
RDW: 11.7 % (ref 11.7–15.4)
WBC: 7.5 10*3/uL (ref 3.4–10.8)

## 2021-03-15 LAB — TESTOSTERONE, FREE, TOTAL, SHBG
Sex Hormone Binding: 107 nmol/L (ref 24.6–122.0)
Testosterone, Free: 1 pg/mL (ref 0.0–4.2)
Testosterone: 16 ng/dL (ref 8–60)

## 2021-03-15 LAB — HEMOGLOBIN A1C
Est. average glucose Bld gHb Est-mCnc: 100 mg/dL
Hgb A1c MFr Bld: 5.1 % (ref 4.8–5.6)

## 2021-03-15 LAB — DHEA-SULFATE: DHEA-SO4: 162 ug/dL (ref 57.3–279.2)

## 2021-03-15 LAB — LIPID PANEL
Chol/HDL Ratio: 2.5 ratio (ref 0.0–4.4)
Cholesterol, Total: 189 mg/dL (ref 100–199)
HDL: 77 mg/dL (ref >39)
LDL Chol Calc (NIH): 88 mg/dL (ref 0–99)
Triglycerides: 143 mg/dL (ref 0–149)
VLDL Cholesterol Cal: 24 mg/dL (ref 5–40)

## 2021-03-15 LAB — HEPATITIS C ANTIBODY: Hep C Virus Ab: 0.1 s/co ratio (ref 0.0–0.9)

## 2021-03-15 LAB — TSH: TSH: 0.827 u[IU]/mL (ref 0.450–4.500)

## 2021-03-15 LAB — FSH/LH
FSH: 4.6 m[IU]/mL
LH: 5.9 m[IU]/mL

## 2021-03-15 LAB — PROGESTERONE: Progesterone: 10.7 ng/mL

## 2021-03-17 LAB — CYTOLOGY - PAP
Adequacy: ABSENT
Comment: NEGATIVE
Diagnosis: NEGATIVE
High risk HPV: NEGATIVE

## 2021-04-18 ENCOUNTER — Other Ambulatory Visit: Payer: Self-pay | Admitting: Obstetrics and Gynecology

## 2021-05-09 ENCOUNTER — Encounter: Payer: Self-pay | Admitting: Emergency Medicine

## 2021-05-09 ENCOUNTER — Emergency Department
Admission: EM | Admit: 2021-05-09 | Discharge: 2021-05-09 | Disposition: A | Discharge status: Home / Self Care | Payer: Medicaid Other | Attending: Emergency Medicine | Admitting: Emergency Medicine

## 2021-05-09 ENCOUNTER — Other Ambulatory Visit: Payer: Self-pay

## 2021-05-09 DIAGNOSIS — F17210 Nicotine dependence, cigarettes, uncomplicated: Secondary | ICD-10-CM | POA: Diagnosis not present

## 2021-05-09 DIAGNOSIS — Z8616 Personal history of COVID-19: Secondary | ICD-10-CM | POA: Insufficient documentation

## 2021-05-09 DIAGNOSIS — Z79899 Other long term (current) drug therapy: Secondary | ICD-10-CM | POA: Insufficient documentation

## 2021-05-09 DIAGNOSIS — R030 Elevated blood-pressure reading, without diagnosis of hypertension: Secondary | ICD-10-CM | POA: Diagnosis present

## 2021-05-09 DIAGNOSIS — I10 Essential (primary) hypertension: Secondary | ICD-10-CM | POA: Insufficient documentation

## 2021-05-09 NOTE — Discharge Instructions (Addendum)
Please continue metoprolol daily and start taking your Norvasc daily in addition to metoprolol.  Continue to monitor blood pressure daily and if any high readings with headaches, vision changes chest pain shortness of breath or dizziness please return to the ER.

## 2021-05-09 NOTE — ED Provider Notes (Signed)
[place] EMERGENCY DEPARTMENT Provider Note   CSN: [id number] Arrival date & time: 05/09/21  1757     History Chief Complaint  Patient presents with   Hypertension    Vanessa Franco is a 40 y.o. female presents to the emergency department evaluation of hypertension.  Patient states blood pressure was 156/100 earlier today.  She has been running higher blood pressures and today PCP started her on amlodipine 5 mg daily.  She takes metoprolol daily already.  She denies any chest pain, shortness of breath, dizziness, headaches, vision changes.  Patient states since her blood pressure was so high her PCP told her to come to the ER.  HPI     Past Medical History:  Diagnosis Date   Anxiety    Chronic hypertension 06/23/2019   COVID-19 virus infection 09/13/2020   Depression    Fibroid    Gestational diabetes    History of D&C    History of uterine fibroid 11/2013   [facility]    Patient Active Problem List   Diagnosis Date Noted   Sciatic pain, left 01/10/2020   Chronic hypertension 06/23/2019   Anxiety 06/23/2019   Tobacco abuse 02/04/2016    Past Surgical History:  Procedure Laterality Date   DILATION AND CURETTAGE OF UTERUS     LAPAROSCOPIC TUBAL LIGATION Bilateral 11/25/2020   Procedure: LAPAROSCOPIC TUBAL LIGATION;  Surgeon: [name], MD;  Location: [facility] ORS;  Service: Gynecology;  Laterality: Bilateral;   MYOMECTOMY  2015   Hysteroscopic     OB History     Gravida  4   Para  2   Term  2   Preterm      AB  2   Living  2      SAB  2   IAB      Ectopic      Multiple  0   Live Births  2           Family History  Problem Relation Age of Onset   Diabetes Father    Kidney failure Father    Heart failure Father    Rheum arthritis Mother    Hypertension Mother     Social History   Tobacco Use   Smoking status: Some Days    Packs/day: 0.25    Types: Cigarettes   Smokeless tobacco: Never  Vaping Use   Vaping  Use: Never used  Substance Use Topics   Alcohol use: Not Currently    Alcohol/week: 0.0 standard drinks    Comment: occass   Drug use: Not Currently    Types: Marijuana    Home Medications Prior to Admission medications   Medication Sig Start Date End Date Taking? Authorizing Provider  amLODipine (NORVASC) 10 MG tablet Take 10 mg by mouth daily. Patient not taking: Reported on 03/12/2021    [provider]  BIOTIN PO Take 1 tablet by mouth daily.    [provider]  hydrOXYzine (VISTARIL) 50 MG capsule Take 1 capsule (50 mg total) by mouth at bedtime as needed for anxiety (insomnia). May take up to 2 tablets as needed 03/12/21   [name], MD  metoprolol succinate (TOPROL XL) 25 MG 24 hr tablet Take 1 tablet (25 mg total) by mouth daily. 03/12/21   [name], MD  metroNIDAZOLE (METROGEL) 0.75 % vaginal gel INSERT 1 APPLICATORFUL VAGINALLY  AT BEDTIME FOR 5 DAYS 04/20/21   [name], MD  Multiple Vitamin (MULTIVITAMIN WITH MINERALS) TABS tablet Take  1 tablet by mouth daily.    [provider]    Allergies    Ketorolac and Sulfa antibiotics  Review of Systems   Review of Systems  Constitutional:  Negative for fever.  Respiratory:  Negative for cough and shortness of breath.   Cardiovascular:  Negative for chest pain and leg swelling.  Gastrointestinal:  Negative for abdominal pain, nausea and vomiting.  Genitourinary:  Negative for difficulty urinating, dysuria and urgency.  Musculoskeletal:  Negative for back pain and myalgias.  Skin:  Negative for rash.  Neurological:  Negative for dizziness and headaches.   Physical Exam Updated Vital Signs BP (!) 139/93 (BP Location: Left Arm)   Pulse 85   Temp 99 F (37.2 C) (Oral)   Resp 18   Ht '5\' 7"'$  (1.702 m)   Wt 87.1 kg   LMP  (LMP Unknown)   SpO2 100%   BMI 30.07 kg/m   Physical Exam Constitutional:      Appearance: She is well-developed.  HENT:     Head: Normocephalic and atraumatic.   Eyes:     Extraocular Movements: Extraocular movements intact.     Conjunctiva/sclera: Conjunctivae normal.     Pupils: Pupils are equal, round, and reactive to light.  Cardiovascular:     Rate and Rhythm: Normal rate and regular rhythm.     Pulses: Normal pulses.     Heart sounds: Normal heart sounds.  Pulmonary:     Effort: Pulmonary effort is normal. No respiratory distress.  Musculoskeletal:        General: Normal range of motion.     Cervical back: Normal range of motion.  Skin:    General: Skin is warm.     Findings: No rash.  Neurological:     General: No focal deficit present.     Mental Status: She is alert and oriented to person, place, and time. Mental status is at baseline.     Cranial Nerves: No cranial nerve deficit.  Psychiatric:        Behavior: Behavior normal.        Thought Content: Thought content normal.    ED Results / Procedures / Treatments   Labs (all labs ordered are listed, but only abnormal results are displayed) Labs Reviewed - No data to display  EKG None  Radiology No results found.  Procedures Procedures   Medications Ordered in ED Medications - No data to display  ED Course  I have reviewed the triage vital signs and the nursing notes.  Pertinent labs & imaging results that were available during my care of the patient were reviewed by me and considered in my medical decision making (see chart for details).    MDM Rules/Calculators/A&P                         40 year old female with history of hypertension.  Was taking metoprolol daily.  Had reported elevated blood pressures and PCP started her back on Norvasc today.  She took 1 dose this morning.  Due to elevated blood pressures earlier in the day, 156/100 patient was sent to the ER.  Blood pressure most recently 139/93, patient asymptomatic appears well no dizziness, lightheadedness, vision changes, headaches, chest pain or shortness of breath.  Patient encouraged to continue  metoprolol and Norvasc daily and to record blood pressures.  She has a follow-up appointment next week with PCP to discuss blood pressure readings.  She understands signs symptoms return to the  ER for. Final Clinical Impression(s) / ED Diagnoses Final diagnoses:  Primary hypertension    Rx / DC Orders ED Discharge Orders     None        Renata Caprice 05/09/21 2122    Lucrezia Starch, MD 05/10/21 1057

## 2021-05-09 NOTE — ED Triage Notes (Signed)
Pt comes into the ED via POV c/o HTN.  Pt states last one she checked was 156/100 with HR of 98.  PT has been checking her BP multiple times through the day.  Pt does admit to [facility], but denies any CP, dizziness, etc.  Pt has even and unlabored respirations at this time and is ambulatory to triage.

## 2021-08-01 ENCOUNTER — Encounter: Payer: Medicaid Other | Admitting: Obstetrics and Gynecology

## 2021-08-01 NOTE — Progress Notes (Deleted)
    GYNECOLOGY PROGRESS NOTE  Subjective:    Patient ID: Vanessa Franco, female    DOB: 1981-05-15, 40 y.o.   MRN: [id number]  HPI  Patient is a 40 y.o. [id number] female who presents for Irregular Bleeding.  {Common ambulatory SmartLinks:19316}  Review of Systems {ros; complete:30496}   Objective:   unknown if currently breastfeeding. There is no height or weight on file to calculate BMI. General appearance: {general exam:16600} Abdomen: {abdominal exam:16834} Pelvic: {pelvic exam:16852::"cervix normal in appearance","external genitalia normal","no adnexal masses or tenderness","no cervical motion tenderness","rectovaginal septum normal","uterus normal size, shape, and consistency","vagina normal without discharge"} Extremities: {extremity exam:5109} Neurologic: {neuro exam:17854}   Assessment:   No diagnosis found.   Plan:   There are no diagnoses linked to this encounter.     [name], MD [facility].

## 2021-08-09 ENCOUNTER — Other Ambulatory Visit: Payer: Self-pay

## 2021-08-09 ENCOUNTER — Emergency Department
Admission: EM | Admit: 2021-08-09 | Discharge: 2021-08-09 | Disposition: A | Discharge status: Home / Self Care | Payer: Medicaid Other | Attending: Emergency Medicine | Admitting: Emergency Medicine

## 2021-08-09 DIAGNOSIS — F17210 Nicotine dependence, cigarettes, uncomplicated: Secondary | ICD-10-CM | POA: Diagnosis not present

## 2021-08-09 DIAGNOSIS — W503XXA Accidental bite by another person, initial encounter: Secondary | ICD-10-CM | POA: Diagnosis not present

## 2021-08-09 DIAGNOSIS — S00501A Unspecified superficial injury of lip, initial encounter: Secondary | ICD-10-CM | POA: Insufficient documentation

## 2021-08-09 DIAGNOSIS — I10 Essential (primary) hypertension: Secondary | ICD-10-CM | POA: Insufficient documentation

## 2021-08-09 DIAGNOSIS — Z79899 Other long term (current) drug therapy: Secondary | ICD-10-CM | POA: Diagnosis not present

## 2021-08-09 MED ORDER — CEPHALEXIN 500 MG PO CAPS: 500.0000 mg | ORAL_CAPSULE | Freq: Three times a day (TID) | ORAL | 0 refills | Status: DC

## 2021-08-09 NOTE — ED Notes (Signed)
Pt endorsing they bit their lip a few days ago and it is still hurting. Pt lip appears to have small abrasion/ulcer on inner lip with surrounding redness.

## 2021-08-09 NOTE — Discharge Instructions (Signed)
A prescription for antibiotics was sent to your pharmacy.  You may continue taking the naproxen over-the-counter as needed for inflammation.  If not improving you should follow-up with your primary care provider for further evaluation and treatment.  You may also take Tylenol as needed for pain.  Warm moist compresses to the area may also help with your lip.
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
## 2021-08-09 NOTE — ED Notes (Signed)
Dc ppw provided to patient. Verbal consent for dc given. Pt folloowup and rx information given. Pt assisted off unit on foot

## 2021-08-09 NOTE — ED Notes (Signed)
Two-fer, with Linton Rump
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
## 2021-08-25 ENCOUNTER — Encounter: Payer: Medicaid Other | Admitting: Obstetrics and Gynecology

## 2021-09-21 IMAGING — US US OB < 14 WEEKS - US OB TV
1 series · 14 of 28 positions shown · non-contrast
Comparison: None.

CLINICAL DATA: Vaginal bleeding. Seven weeks 0 days pregnant by
last menstrual

EXAM:
OBSTETRIC <14 WK US AND TRANSVAGINAL OB US
TECHNIQUE: Both transabdominal and transvaginal ultrasound examinations were
performed for complete evaluation of the gestation as well as the
maternal uterus, adnexal regions, and pelvic cul-de-sac.
Transvaginal technique was performed to assess early pregnancy.

[Series 1: us ob < 14 weeks - us ob tv · 14 of 123 slices shown]
[im 5/123]
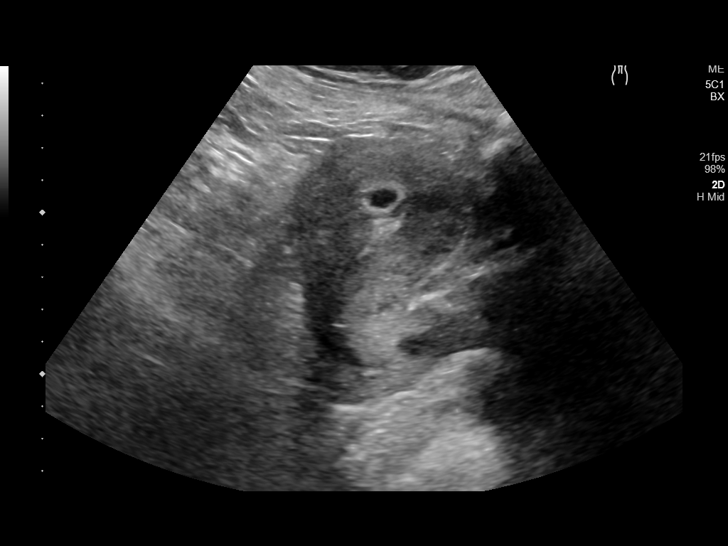
[im 14/123]
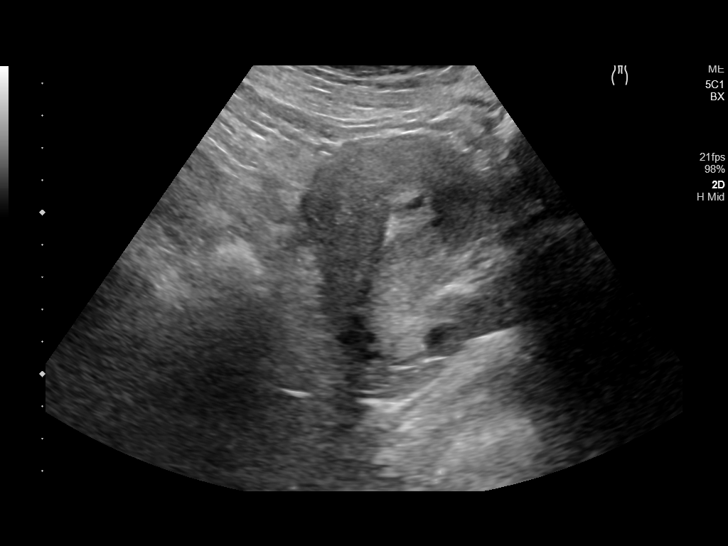
[im 23/123]
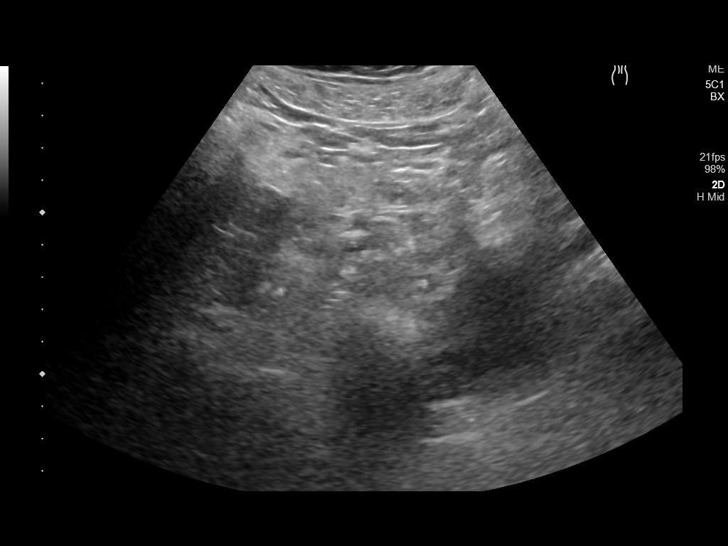
[im 32/123]
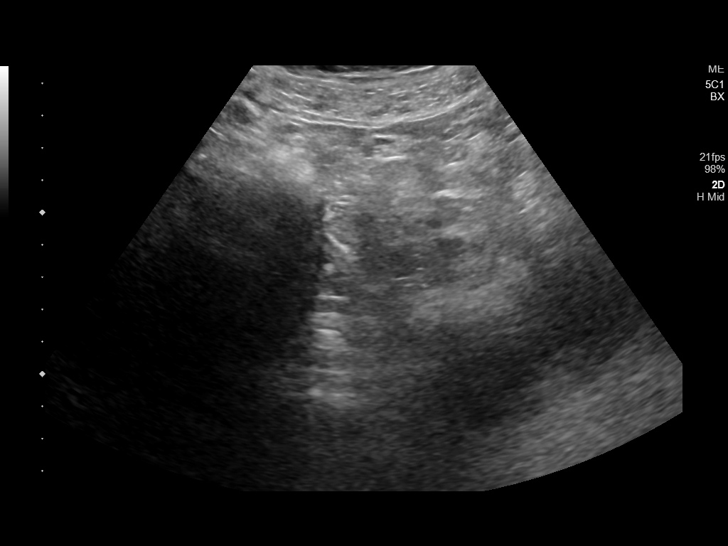
[im 41/123]
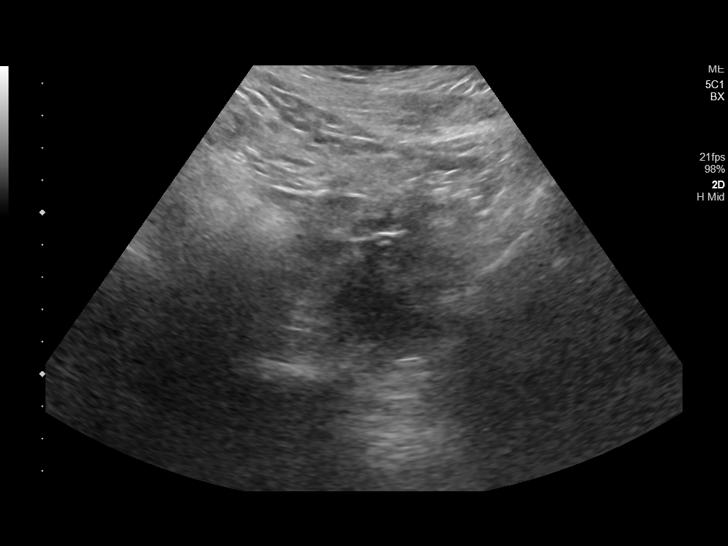
[im 50/123]
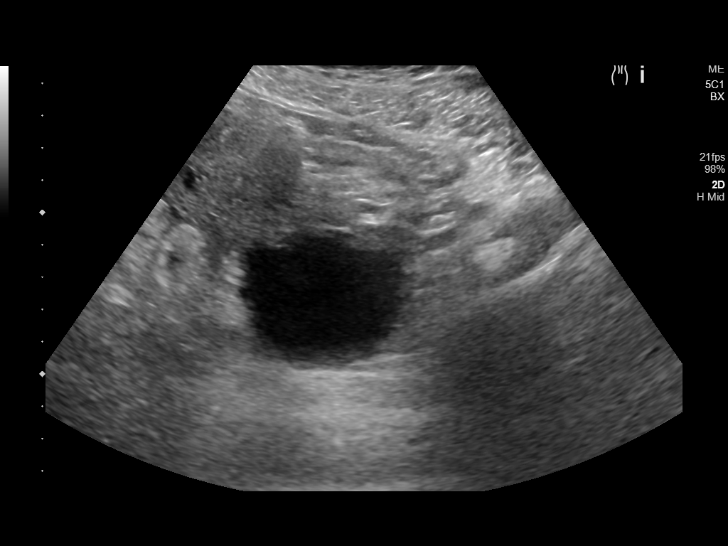
[im 59/123]
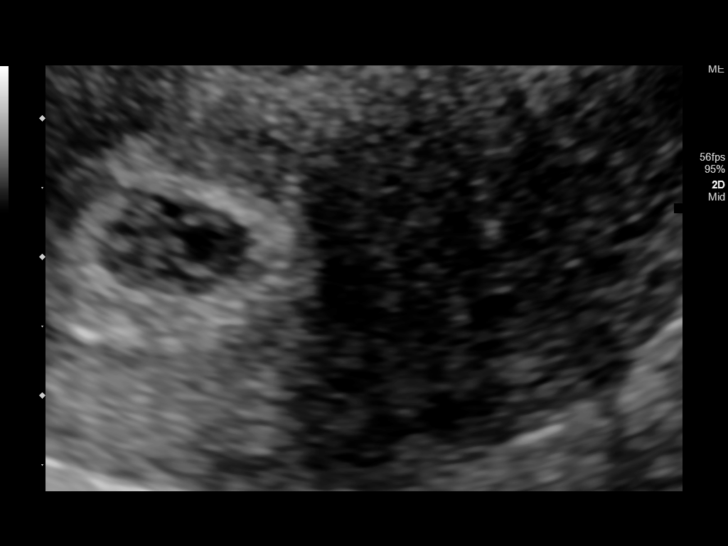
[im 68/123]
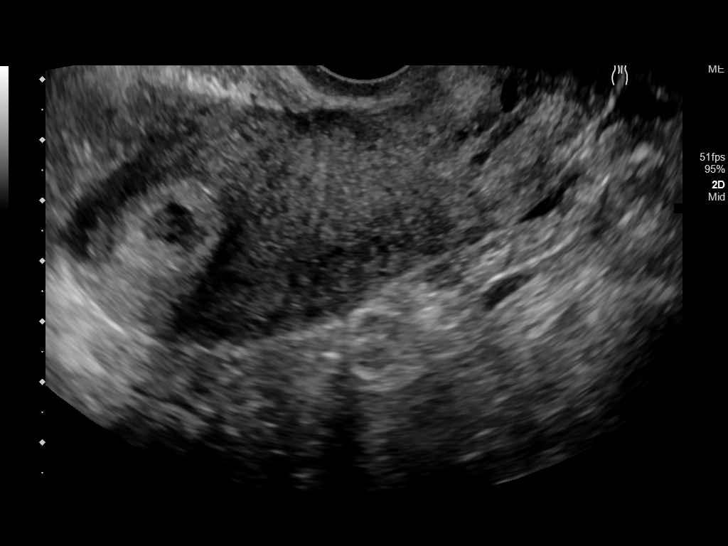
[im 77/123]
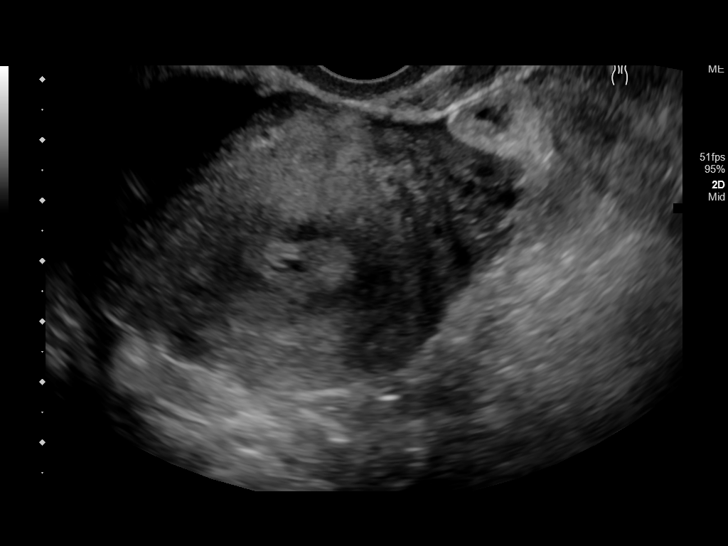
[im 86/123]
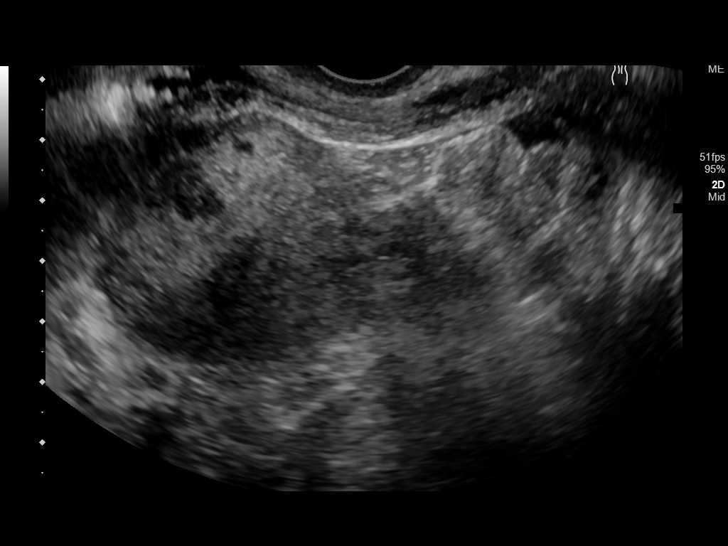
[im 95/123]
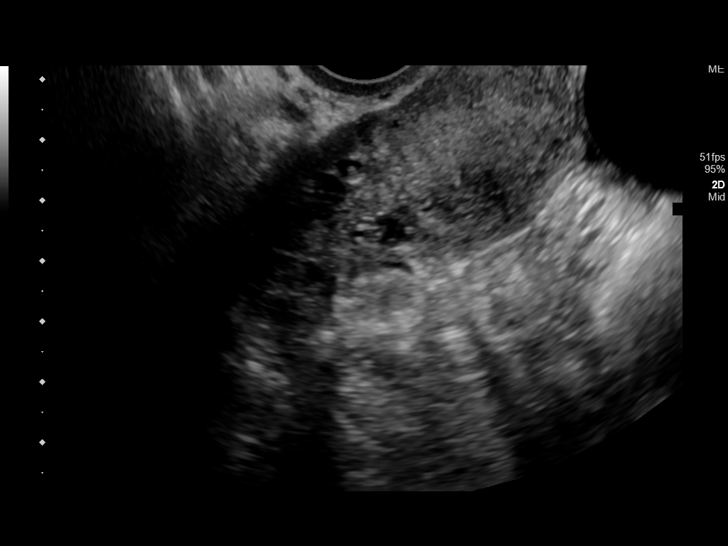
[im 104/123]
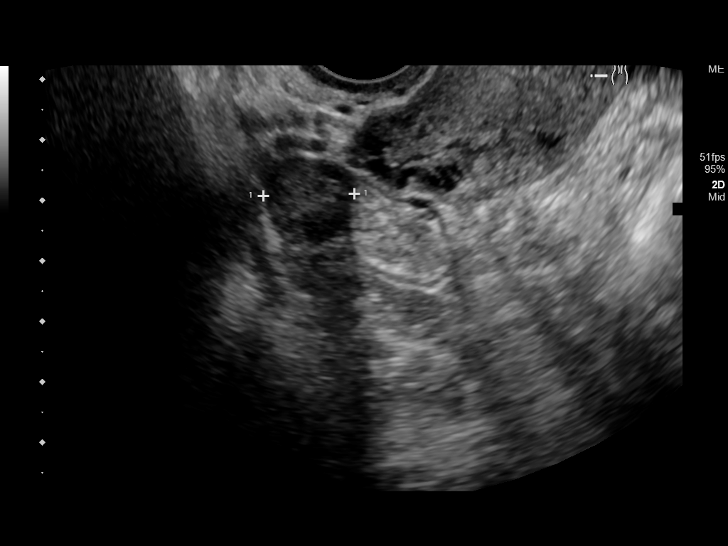
[im 113/123]
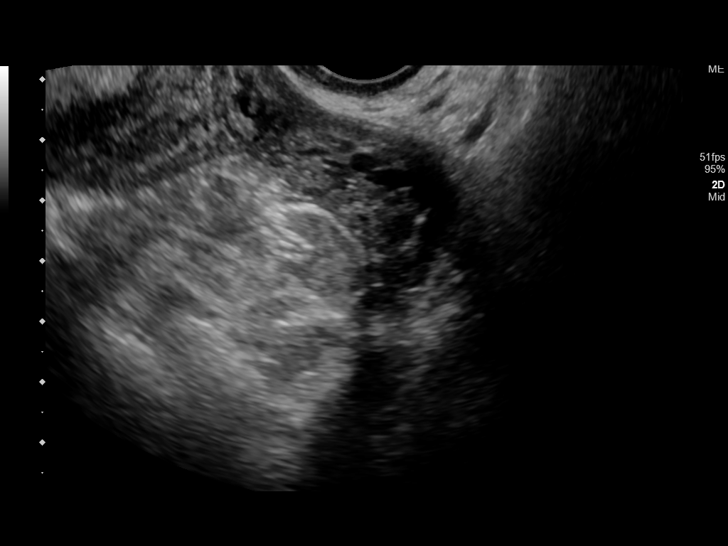
[im 123/123]
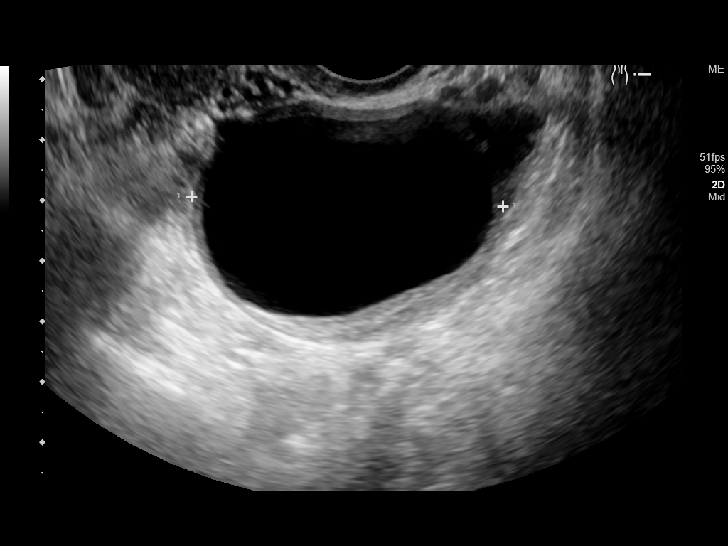

[14 of 28 positions shown; findings below may reference images not displayed]

FINDINGS: Intrauterine gestational sac: Single

Yolk sac:  Present

Embryo:  Present

Cardiac Activity: Present

Heart Rate: 117 bpm

CRL:  6  mm   6 w   3 d

Subchorionic hemorrhage:  None visualized.

Maternal uterus/adnexae: Normal appearance of the right ovary. The
left ovary demonstrates a simple cyst of 4.0 x 3.3 x 4.8 cm.
IMPRESSION: 1. Intrauterine pregnancy of approximately 6 weeks 3 days with fetal
heart rate of 117 beats per minute.
2. No evidence of subchorionic hemorrhage.
3. Left ovarian dominant simple cyst of 4.0 cm. Per consensus
criteria, presuming the patient is asymptomatic, this does not
require follow-up. If patient has symptoms attributed to the left
lower quadrant, follow-up at 6-12 weeks suggested.

## 2021-10-08 ENCOUNTER — Encounter: Payer: Self-pay | Admitting: Emergency Medicine

## 2021-10-08 ENCOUNTER — Other Ambulatory Visit: Payer: Self-pay

## 2021-10-08 ENCOUNTER — Emergency Department
Admission: EM | Admit: 2021-10-08 | Discharge: 2021-10-08 | Disposition: A | Discharge status: Home / Self Care | Payer: Medicaid Other | Attending: Emergency Medicine | Admitting: Emergency Medicine

## 2021-10-08 DIAGNOSIS — Z20822 Contact with and (suspected) exposure to covid-19: Secondary | ICD-10-CM | POA: Insufficient documentation

## 2021-10-08 DIAGNOSIS — J3489 Other specified disorders of nose and nasal sinuses: Secondary | ICD-10-CM | POA: Insufficient documentation

## 2021-10-08 DIAGNOSIS — I10 Essential (primary) hypertension: Secondary | ICD-10-CM | POA: Diagnosis not present

## 2021-10-08 DIAGNOSIS — Z28310 Unvaccinated for covid-19: Secondary | ICD-10-CM | POA: Diagnosis not present

## 2021-10-08 DIAGNOSIS — R059 Cough, unspecified: Secondary | ICD-10-CM | POA: Diagnosis present

## 2021-10-08 DIAGNOSIS — J069 Acute upper respiratory infection, unspecified: Secondary | ICD-10-CM | POA: Diagnosis not present

## 2021-10-08 LAB — RESP PANEL BY RT-PCR (FLU A&B, COVID) ARPGX2
Influenza A by PCR: NEGATIVE
Influenza B by PCR: NEGATIVE
SARS Coronavirus 2 by RT PCR: NEGATIVE

## 2021-10-08 MED ORDER — PROMETHAZINE-DM 6.25-15 MG/5ML PO SYRP: 5.0000 mL | ORAL_SOLUTION | Freq: Four times a day (QID) | ORAL | 0 refills | Status: DC | PRN

## 2021-10-08 NOTE — Discharge Instructions (Addendum)
Follow-up with your primary care provider if any continued problems or not improving in 5 to 7 days.  Viruses do not respond to antibiotics.  You may discontinue taking the DayQuil and NyQuil and a prescription for promethazine dextromethorphan was sent to your pharmacy.  You may also use saline nose spray to help with nasal congestion.  Increase fluids to stay hydrated.  You may take Tylenol or ibuprofen as needed for body aches, fever or headache.

## 2021-10-08 NOTE — ED Provider Notes (Signed)
[facility] Provider Note    Event Date/Time   First MD Initiated Contact with Patient 10/08/21 [id number]     (approximate)   History   Cough, Nasal Congestion, and Generalized Body Aches   HPI  Vanessa Franco is a 41 y.o. female   presents to the ED with complaint of cough, rhinorrhea and body aches for the last 3 days.  Patient is unaware of any fever.  Vanessa states that this morning Vanessa could not smell.  Vanessa reports that her son had similar symptoms that only lasted approximately 24 to 36 hours and then he was better.  Patient did not get COVID or influenza vaccines.  Currently Vanessa is taking DayQuil and NyQuil with little relief.  Patient admits Vanessa has not taken her blood pressure medicine  today.  Vanessa has a history of hypertension, anxiety and depression.      Physical Exam   Triage Vital Signs: ED Triage Vitals [10/08/21 0719]  Enc Vitals Group     BP      Pulse      Resp      Temp      Temp src      SpO2      Weight 195 lb 1.7 oz (88.5 kg)     Height 5\' 7"  (1.702 m)     Head Circumference      Peak Flow      Pain Score 8     Pain Loc      Pain Edu?      Excl. in [place]?     Most recent vital signs: Vitals:   10/08/21 0726  BP: (!) 159/97  Pulse: 92  Resp: 20  Temp: 98.6 F (37 C)  SpO2: 100%     General: Awake, no distress.  CV:  Good peripheral perfusion.  Heart regular rate and rhythm without murmur. Resp:  Normal effort.  Lungs are clear bilaterally. Abd:  No distention.  Other:  Moderate nasal congestion.   ED Results / Procedures / Treatments   Labs (all labs ordered are listed, but only abnormal results are displayed) Labs Reviewed  RESP PANEL BY RT-PCR (FLU A&B, COVID) [id number]     PROCEDURES:  Critical Care performed: No  Procedures   MEDICATIONS ORDERED IN ED: Medications - No data to display   IMPRESSION / MDM / [place] / ED COURSE  I reviewed the triage vital signs and the nursing notes.                               Differential diagnosis includes, but is not limited to, COVID, influenza, viral URI.  41 year old female presents to the ED with complaint of nasal congestion, rhinorrhea and body aches for the last 3 days.  Patient states that her son had similar symptoms that did not last more than 2 days.  Patient has a history of hypertension, anxiety and depression.  Vanessa admits that today Vanessa has not taken her blood pressure medication but normally does.  Nasal swab was reviewed by myself and was reported negative for influenza and COVID.  Patient is being discharged with prescription for promethazine diphenhydramine as Vanessa states that the DayQuil and NyQuil is not helping with her symptoms at home.  Patient was encouraged to continue with fluids, ibuprofen and Tylenol as needed and a work note was given for the next 2 days.  Vanessa is instructed  to follow-up with her PCP, Dr. Ladoris Gene if any continued problems or not improving.        FINAL CLINICAL IMPRESSION(S) / ED DIAGNOSES   Final diagnoses:  Viral URI with cough     Rx / DC Orders   ED Discharge Orders          Ordered    promethazine-dextromethorphan (PROMETHAZINE-DM) 6.25-15 MG/5ML syrup  4 times daily PRN,   Status:  Discontinued        10/08/21 0818    promethazine-dextromethorphan (PROMETHAZINE-DM) 6.25-15 MG/5ML syrup  4 times daily PRN        10/08/21 7034             Note:  This document was prepared using Dragon voice recognition software and may include unintentional dictation errors.   Johnn Hai, PA-C 10/08/21 0827    Lucrezia Starch, MD 10/08/21 1525

## 2021-10-08 NOTE — ED Notes (Signed)
Pt to ED for cough, congestion, neck pain, and body aches that started 3 days ago. Pt ambulatory to room with steady gait.

## 2021-10-08 NOTE — ED Triage Notes (Signed)
Pt comes into the ED via POV c/o cough, runny nose, and body aches x 3 days.  Pt in NAD at this time with even and unlabored respirations.  Pt denies any known fevers.

## 2021-10-21 ENCOUNTER — Encounter: Payer: Self-pay | Admitting: *Deleted

## 2021-12-09 ENCOUNTER — Encounter: Payer: Self-pay | Admitting: Obstetrics and Gynecology

## 2021-12-09 ENCOUNTER — Ambulatory Visit: Payer: Medicaid Other | Admitting: Obstetrics and Gynecology

## 2021-12-09 ENCOUNTER — Other Ambulatory Visit: Payer: Self-pay

## 2021-12-09 VITALS — BP 160/82 | HR 81 | Resp 16 | Ht 67.0 in | Wt 193.7 lb

## 2021-12-09 DIAGNOSIS — N939 Abnormal uterine and vaginal bleeding, unspecified: Secondary | ICD-10-CM

## 2021-12-09 NOTE — Progress Notes (Signed)
Patient left prior to being seen by provider. Noted that she needed to pick up her children from school. Rescheduled for next week.  ?

## 2021-12-09 NOTE — Progress Notes (Signed)
?   Patient is a 41 y.o. [id number] female who presents for irregular menstrual cycles. ? ? ?

## 2021-12-17 ENCOUNTER — Encounter: Payer: Medicaid Other | Admitting: Obstetrics and Gynecology

## 2021-12-17 NOTE — Progress Notes (Deleted)
? ? ?  GYNECOLOGY PROGRESS NOTE ? ?Subjective:  ? ? Patient ID: Vanessa Franco, female    DOB: 1980/09/25, 41 y.o.   MRN: [id number] ? ?HPI ? Patient is a 41 y.o. [id number] female who presents for evaluation of irregular menstrual cycles. ? ?{Common ambulatory SmartLinks:19316} ? ?Review of Systems ?{ros; complete:30496}  ? ?Objective:  ? Last menstrual period 11/19/2021, unknown if currently breastfeeding. There is no height or weight on file to calculate BMI. ?General appearance: {general exam:16600} ?Abdomen: {abdominal exam:16834} ?Pelvic: {pelvic exam:16852::"cervix normal in appearance","external genitalia normal","no adnexal masses or tenderness","no cervical motion tenderness","rectovaginal septum normal","uterus normal size, shape, and consistency","vagina normal without discharge"} ?Extremities: {extremity exam:5109} ?Neurologic: {neuro exam:17854} ? ? ?Assessment:  ? ?No diagnosis found.  ? ?Plan:  ? ?There are no diagnoses linked to this encounter.  ? ? ? ?[name], MD ?[facility]  ?

## 2021-12-24 ENCOUNTER — Encounter: Payer: Medicaid Other | Admitting: Obstetrics and Gynecology

## 2021-12-24 NOTE — Progress Notes (Deleted)
? ? ?  GYNECOLOGY PROGRESS NOTE ? ?Subjective:  ? ? Patient ID: Vanessa Franco, female    DOB: [birth year], 41 y.o.   MRN: [id number] ? ?HPI ? Patient is a 41 y.o. [id number] female who presents for irregular menstrual bleeding. ? ?{Common ambulatory SmartLinks:19316} ? ?Review of Systems ?{ros; complete:30496}  ? ?Objective:  ? unknown if currently breastfeeding. There is no height or weight on file to calculate BMI. ?General appearance: {general exam:16600} ?Abdomen: {abdominal exam:16834} ?Pelvic: {pelvic exam:16852::"cervix normal in appearance","external genitalia normal","no adnexal masses or tenderness","no cervical motion tenderness","rectovaginal septum normal","uterus normal size, shape, and consistency","vagina normal without discharge"} ?Extremities: {extremity exam:5109} ?Neurologic: {neuro exam:17854} ? ? ?Assessment:  ? ?No diagnosis found.  ? ?Plan:  ? ?There are no diagnoses linked to this encounter.  ? ? ?[name], MD ?[facility]  ?

## 2022-04-07 DIAGNOSIS — M25561 Pain in right knee: Secondary | ICD-10-CM | POA: Insufficient documentation

## 2022-04-07 DIAGNOSIS — M533 Sacrococcygeal disorders, not elsewhere classified: Secondary | ICD-10-CM | POA: Insufficient documentation

## 2022-04-07 DIAGNOSIS — M47816 Spondylosis without myelopathy or radiculopathy, lumbar region: Secondary | ICD-10-CM | POA: Insufficient documentation

## 2022-06-30 ENCOUNTER — Emergency Department
Admission: EM | Admit: 2022-06-30 | Discharge: 2022-06-30 | Disposition: A | Discharge status: Home / Self Care | Payer: Self-pay | Attending: Emergency Medicine | Admitting: Emergency Medicine

## 2022-06-30 ENCOUNTER — Other Ambulatory Visit: Payer: Self-pay

## 2022-06-30 ENCOUNTER — Encounter: Payer: Self-pay | Admitting: Medical Oncology

## 2022-06-30 ENCOUNTER — Emergency Department: Payer: Self-pay

## 2022-06-30 DIAGNOSIS — I10 Essential (primary) hypertension: Secondary | ICD-10-CM | POA: Insufficient documentation

## 2022-06-30 DIAGNOSIS — Z20822 Contact with and (suspected) exposure to covid-19: Secondary | ICD-10-CM | POA: Insufficient documentation

## 2022-06-30 DIAGNOSIS — J069 Acute upper respiratory infection, unspecified: Secondary | ICD-10-CM | POA: Insufficient documentation

## 2022-06-30 LAB — RESP PANEL BY RT-PCR (FLU A&B, COVID) ARPGX2
Influenza A by PCR: NEGATIVE
Influenza B by PCR: NEGATIVE
SARS Coronavirus 2 by RT PCR: NEGATIVE

## 2022-06-30 MED ORDER — ACETAMINOPHEN 325 MG PO TABS
650.0000 mg | ORAL_TABLET | Freq: Once | ORAL | Status: AC
  Administered 2022-06-30: 650 mg via ORAL
  Filled 2022-06-30: qty 2

## 2022-06-30 NOTE — ED Provider Notes (Signed)
[facility] Provider Note    Event Date/Time   First MD Initiated Contact with Patient 06/30/22 [phone]     (approximate)   History   URI   HPI  Vanessa Franco is a 41 y.o. female with a past medical of sciatica, hypertension, anxiety presents today for evaluation of cough, body aches, and runny nose for the past 4 days.  She reports that her children are sick with the same symptoms.  She has not had any chest pain or trouble breathing.  She denies abdominal pain, nausea, vomiting, diarrhea.  She has not had any fevers or chills.  She has not taken anything for her symptoms.  Patient Active Problem List   Diagnosis Date Noted   Sciatic pain, left 01/10/2020   Chronic hypertension 06/23/2019   Anxiety 06/23/2019   Tobacco abuse 02/04/2016          Physical Exam   Triage Vital Signs: ED Triage Vitals  Enc Vitals Group     BP 06/30/22 0905 (!) 148/99     Pulse Rate 06/30/22 0905 (!) 104     Resp 06/30/22 0905 16     Temp 06/30/22 0905 98.3 F (36.8 C)     Temp Source 06/30/22 0905 Oral     SpO2 06/30/22 0905 98 %     Weight 06/30/22 0902 191 lb 12.8 oz (87 kg)     Height 06/30/22 0902 '5\' 7"'$  (1.702 m)     Head Circumference --      Peak Flow --      Pain Score 06/30/22 0902 0     Pain Loc --      Pain Edu? --      Excl. in [place]? --     Most recent vital signs: Vitals:   06/30/22 1200 06/30/22 1342  BP: (!) 149/105 (!) 140/99  Pulse: 95 90  Resp: 18 18  Temp: 98.3 F (36.8 C)   SpO2: 100% 100%    Physical Exam Vitals and nursing note reviewed.  Constitutional:      General: Awake and alert. No acute distress.    Appearance: Normal appearance. The patient is overweight.  HENT:     Head: Normocephalic and atraumatic.     Mouth: Mucous membranes are moist.  Nasal congestion present Eyes:     General: PERRL. Normal EOMs        Right eye: No discharge.        Left eye: No discharge.     Conjunctiva/sclera: Conjunctivae normal.   Cardiovascular:     Rate and Rhythm: Normal rate and regular rhythm.     Pulses: Normal pulses.     Heart sounds: Normal heart sounds Pulmonary:     Effort: Pulmonary effort is normal. No respiratory distress.     Breath sounds: Normal breath sounds.  Able to speak easily in complete sentences.  No wheezing Abdominal:     Abdomen is soft. There is no abdominal tenderness. No rebound or guarding. No distention. Musculoskeletal:        General: No swelling. Normal range of motion.     Cervical back: Normal range of motion and neck supple.  Skin:    General: Skin is warm and dry.     Capillary Refill: Capillary refill takes less than 2 seconds.     Findings: No rash.  Neurological:     Mental Status: The patient is awake and alert.      ED Results / Procedures /  Treatments   Labs (all labs ordered are listed, but only abnormal results are displayed) Labs Reviewed  RESP PANEL BY RT-PCR (FLU A&B, COVID) [id number]     EKG     RADIOLOGY I independently reviewed and interpreted imaging and agree with radiologists findings.     PROCEDURES:  Critical Care performed:   Procedures   MEDICATIONS ORDERED IN ED: Medications  acetaminophen (TYLENOL) tablet 650 mg (650 mg Oral Given 06/30/22 1251)     IMPRESSION / MDM / ASSESSMENT AND PLAN / ED COURSE  I reviewed the triage vital signs and the nursing notes.   Differential diagnosis includes, but is not limited to, COVID, flu, RSV, other URI, mono.  Patient is a stable and afebrile.  She has a normal oxygen saturation of 100% on room air and demonstrates no increased work of breathing, her lung sounds are clear to auscultation bilaterally.  She is requesting a chest x-ray for pneumonia.  COVID/flu/RSV swab is negative.  She was treated symptomatically with Tylenol improvement of her symptoms.  Chest x-ray was negative for any acute findings.  We discussed symptomatic management and return precautions.  Patient understands and  agrees with plan.  Discharged in stable condition.   Patient's presentation is most consistent with acute complicated illness / injury requiring diagnostic workup.    FINAL CLINICAL IMPRESSION(S) / ED DIAGNOSES   Final diagnoses:  Viral URI with cough     Rx / DC Orders   ED Discharge Orders     None        Note:  This document was prepared using Dragon voice recognition software and may include unintentional dictation errors.   Emeline Gins 06/30/22 Yevette Edwards    Lucillie Garfinkel, MD 06/30/22 2045

## 2022-06-30 NOTE — ED Triage Notes (Signed)
Pt reports cold sx's x 4 days.

## 2022-06-30 NOTE — Discharge Instructions (Signed)
Your COVID/flu/RSV test was negative.  Your chest x-ray was normal.  You may continue to take Tylenol/ibuprofen per package instructions to help with your symptoms.  Please return for any new, worsening, or change in symptoms or other concerns.  It was a pleasure caring for you today.

## 2022-07-07 ENCOUNTER — Encounter: Payer: Self-pay | Admitting: Obstetrics and Gynecology

## 2022-07-07 ENCOUNTER — Encounter: Payer: Self-pay | Admitting: Certified Nurse Midwife

## 2022-12-17 IMAGING — CR DG CHEST 2V
1 series · 2 of 2 positions shown · non-contrast
Comparison: 09/01/2015

CLINICAL DATA: Cough and short of breath

EXAM:
CHEST - 2 VIEW

[Series 1: dg chest 2 view · 0.14mm/px · 2 of 2 slices shown]
[im 1/2]
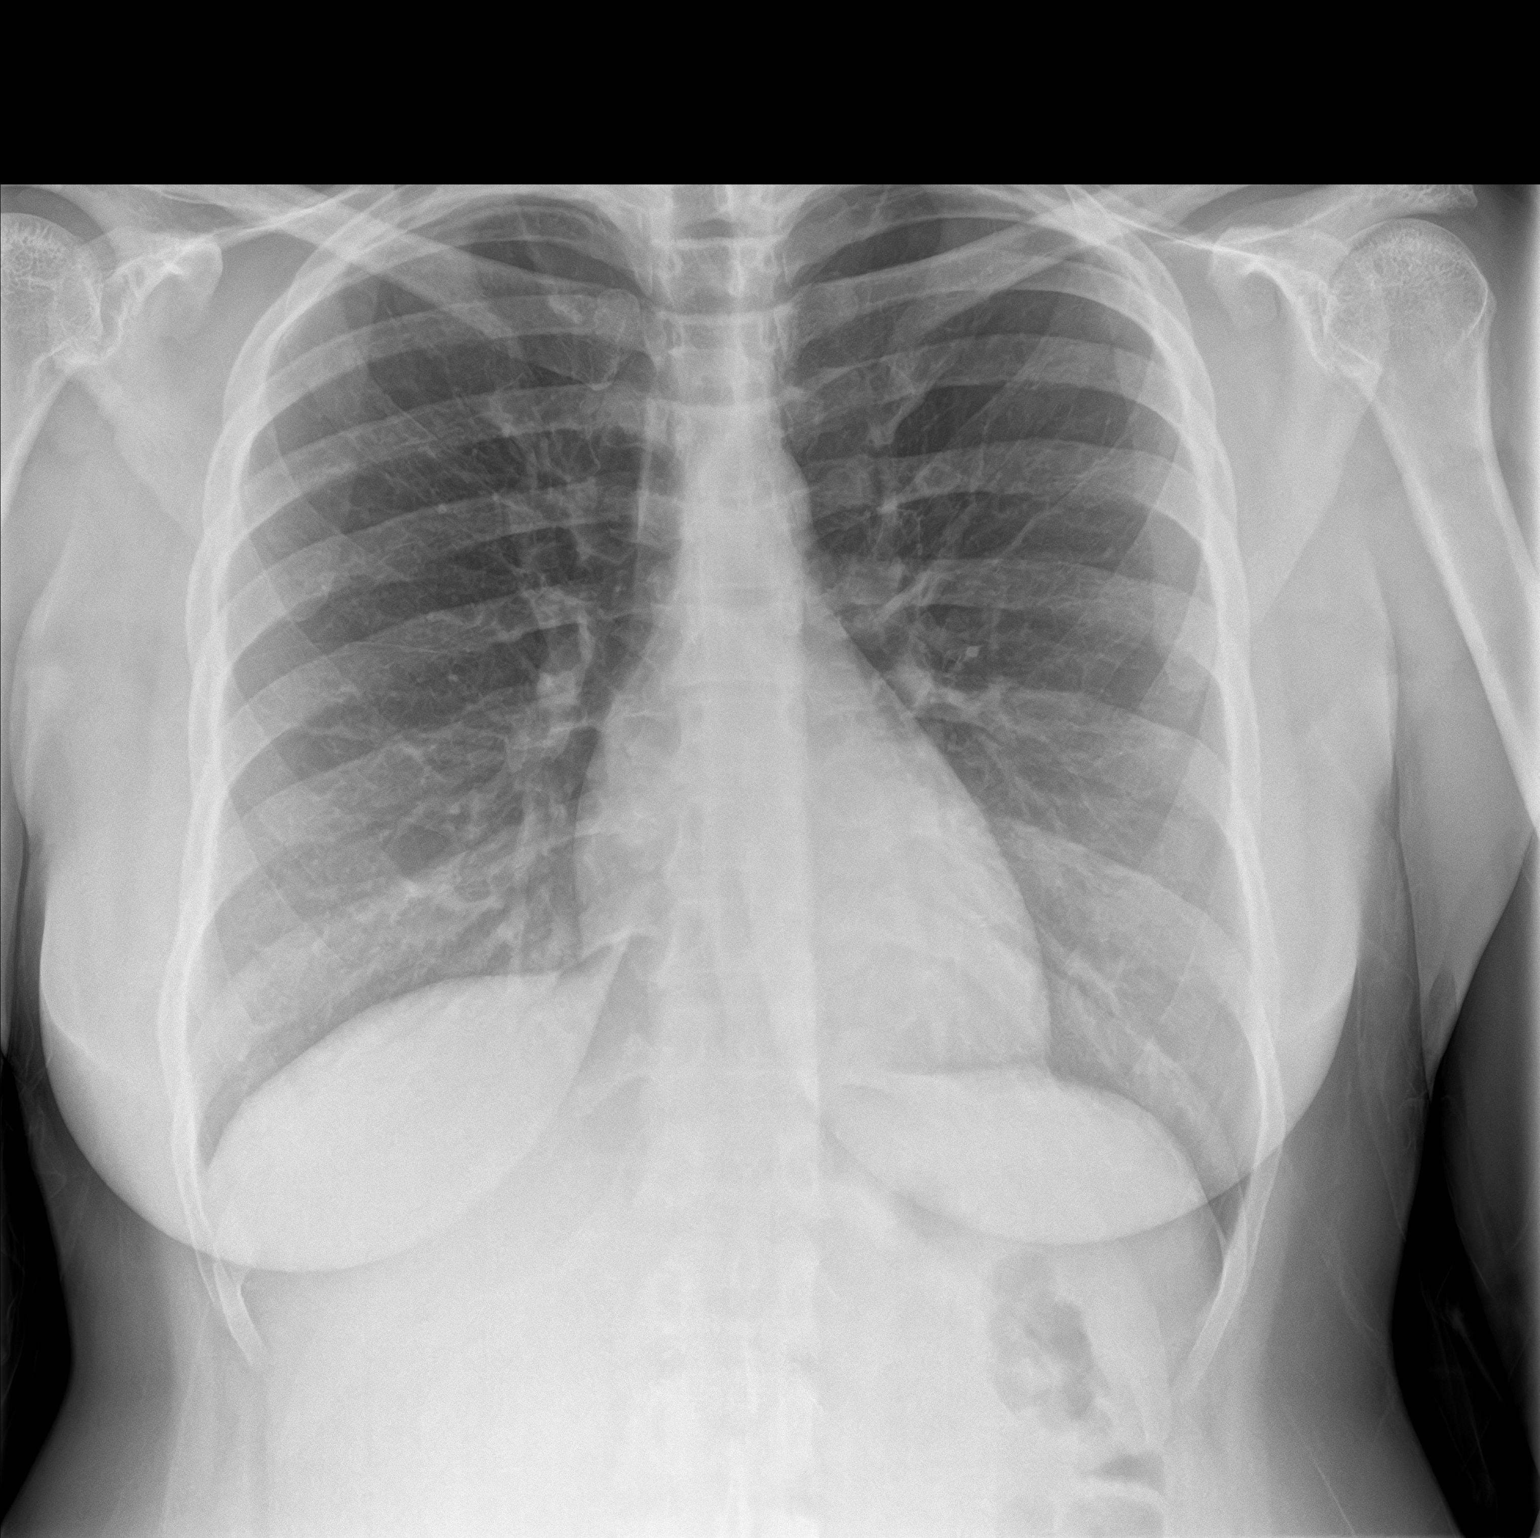
[im 2/2]
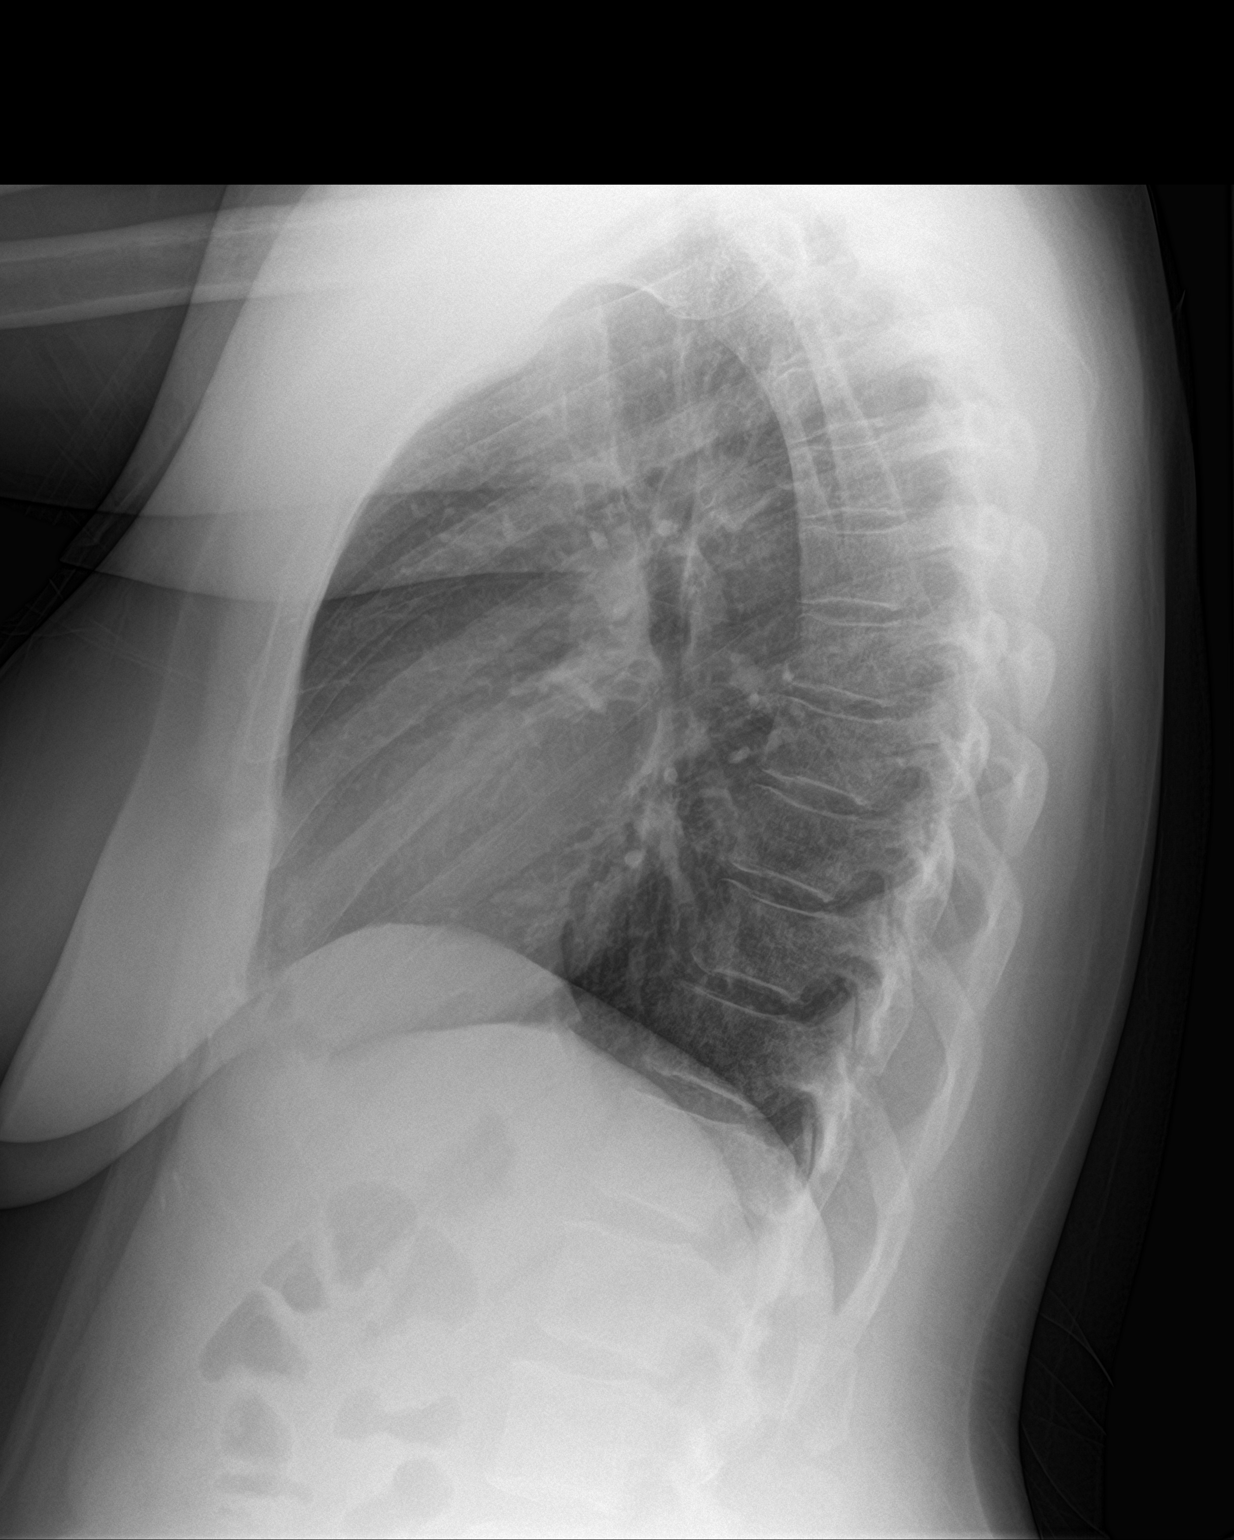

[2 of 2 positions shown; findings below may reference images not displayed]

FINDINGS: The heart size and mediastinal contours are within normal limits.
Both lungs are clear. The visualized skeletal structures are
unremarkable.
IMPRESSION: No active cardiopulmonary disease.

## 2023-01-01 ENCOUNTER — Ambulatory Visit (INDEPENDENT_AMBULATORY_CARE_PROVIDER_SITE_OTHER): Payer: Medicaid Other | Admitting: Obstetrics and Gynecology

## 2023-01-01 ENCOUNTER — Other Ambulatory Visit (HOSPITAL_COMMUNITY)
Admission: RE | Admit: 2023-01-01 | Discharge: 2023-01-01 | Disposition: A | Discharge status: Home / Self Care | Payer: Medicaid Other | Source: Ambulatory Visit | Attending: Obstetrics and Gynecology | Admitting: Obstetrics and Gynecology

## 2023-01-01 ENCOUNTER — Encounter: Payer: Self-pay | Admitting: Obstetrics and Gynecology

## 2023-01-01 VITALS — BP 127/77 | HR 87 | Resp 16 | Ht 67.0 in | Wt 212.9 lb

## 2023-01-01 DIAGNOSIS — N938 Other specified abnormal uterine and vaginal bleeding: Secondary | ICD-10-CM

## 2023-01-01 DIAGNOSIS — N898 Other specified noninflammatory disorders of vagina: Secondary | ICD-10-CM | POA: Insufficient documentation

## 2023-01-01 DIAGNOSIS — Z86018 Personal history of other benign neoplasm: Secondary | ICD-10-CM | POA: Diagnosis not present

## 2023-01-01 NOTE — Progress Notes (Signed)
    GYNECOLOGY PROGRESS NOTE  Subjective:    Patient ID: Vanessa Franco, female    DOB: 1981-06-02, 42 y.o.   MRN: [id number]  HPI  Patient is a 42 y.o. [id number] female who presents for concern of abnormal uterine bleeding that has been present intermittently over the past 4 months. Patient reports that flow has been light and describes flow as bright red for 1-2 days and spotting there after. Usually occurs 4-7 days after her cycle has ended.  Also associated with intermittent sharp pelvic pain. Does have a remote history of fibroids s/p myomectomy in 2015. Patient also reports vaginal discharge that she has noted for "some time", attempted OTC treatments with boric acid and a 1 day yeast treatment recently. Thinks it made the discharge worse. Is currently sexually active. Last pap smear was normal in 2022.     The following portions of the patient's history were reviewed and updated as appropriate:  She  has a past medical history of Anxiety, Chronic hypertension (06/23/2019), COVID-19 virus infection (09/13/2020), Depression, Fibroid, Gestational diabetes, History of D&C, and History of uterine fibroid (11/2013).  She  has a past surgical history that includes Myomectomy (2015); Dilation and curettage of uterus; and Laparoscopic tubal ligation (Bilateral, 11/25/2020).  Her family history includes Diabetes in her father; Heart failure in her father; Hypertension in her mother; Kidney failure in her father; Rheum arthritis in her mother.  Social History   Tobacco Use   Smoking status: Some Days    Packs/day: .25    Types: Cigarettes   Smokeless tobacco: Never  Vaping Use   Vaping Use: Never used  Substance Use Topics   Alcohol use: Not Currently    Alcohol/week: 0.0 standard drinks of alcohol    Comment: occass   Drug use: Not Currently    Types: Marijuana     She has a current medication list which includes the following prescription(s): amlodipine, hydrochlorothiazide, nicotine,  and nicotine. She is allergic to ketorolac, elemental sulfur, and sulfa antibiotics..  Review of Systems Pertinent items noted in HPI and remainder of comprehensive ROS otherwise negative.   Objective:   Blood pressure 127/77, pulse 87, resp. rate 16, height 5\' 7"  (1.702 m), weight 212 lb 14.4 oz (96.6 kg), last menstrual period 12/06/2022, not currently breastfeeding. Body mass index is 33.34 kg/m. General appearance: alert and no distress Abdomen: soft, non-tender; bowel sounds normal; no masses,  no organomegaly Pelvic: external genitalia normal, rectovaginal septum normal.  Vagina with small amount of thick white discharge, no odor.  Cervix with dark blue-purple area at 12 o'clock, possible nabothian cyst.  appearing, no lesions and no motion tenderness.  Uterus mobile, nontender, normal shape and size.  Adnexae non-palpable, nontender bilaterally.  Extremities: extremities normal, atraumatic, no cyanosis or edema Neurologic: Grossly normal   Assessment:   1. DUB (dysfunctional uterine bleeding)   2. History of uterine fibroid   3. Vaginal discharge      Plan:   Patient with dysfunctional uterine bleeding (metrorrhagia). No blood noted on exam today. Occurs ~ 1 week after cycle. Will order pelvic ultrasound as patient with previous h/o fibroids.  Cervicovaginal swab collected today for vaginal discharge, advised that vaginitis/cervicitis can also be a cause of pain if positive findings noted.  3. Return to clinic for any scheduled appointments or for any gynecologic concerns as needed.    Hildred Laser, MD [facility]

## 2023-01-05 ENCOUNTER — Other Ambulatory Visit: Payer: Self-pay | Admitting: Obstetrics and Gynecology

## 2023-01-05 DIAGNOSIS — N760 Acute vaginitis: Secondary | ICD-10-CM

## 2023-01-05 LAB — CERVICOVAGINAL ANCILLARY ONLY
Bacterial Vaginitis (gardnerella): POSITIVE — AB
Candida Glabrata: NEGATIVE
Candida Vaginitis: NEGATIVE
Chlamydia: NEGATIVE
Comment: NEGATIVE
Comment: NEGATIVE
Comment: NEGATIVE
Comment: NEGATIVE
Comment: NEGATIVE
Comment: NORMAL
Neisseria Gonorrhea: NEGATIVE
Trichomonas: NEGATIVE

## 2023-01-05 MED ORDER — NUVESSA 1.3 % VA GEL: 1.0000 | Freq: Once | VAGINAL | 1 refills | Status: AC

## 2023-01-07 ENCOUNTER — Encounter: Payer: Self-pay | Admitting: Obstetrics and Gynecology

## 2023-01-15 ENCOUNTER — Ambulatory Visit (INDEPENDENT_AMBULATORY_CARE_PROVIDER_SITE_OTHER): Payer: Medicaid Other

## 2023-01-15 DIAGNOSIS — N938 Other specified abnormal uterine and vaginal bleeding: Secondary | ICD-10-CM

## 2023-01-15 DIAGNOSIS — Z86018 Personal history of other benign neoplasm: Secondary | ICD-10-CM

## 2023-03-01 ENCOUNTER — Other Ambulatory Visit: Payer: Self-pay | Admitting: Nurse Practitioner

## 2023-03-01 DIAGNOSIS — Z1231 Encounter for screening mammogram for malignant neoplasm of breast: Secondary | ICD-10-CM

## 2023-03-11 ENCOUNTER — Ambulatory Visit
Admission: RE | Admit: 2023-03-11 | Discharge: 2023-03-11 | Disposition: A | Discharge status: Home / Self Care | Payer: Medicaid Other | Source: Ambulatory Visit | Attending: Nurse Practitioner | Admitting: Nurse Practitioner

## 2023-03-11 DIAGNOSIS — Z1231 Encounter for screening mammogram for malignant neoplasm of breast: Secondary | ICD-10-CM | POA: Insufficient documentation

## 2024-02-17 ENCOUNTER — Other Ambulatory Visit: Payer: Self-pay

## 2024-02-17 ENCOUNTER — Encounter: Payer: Self-pay | Admitting: Emergency Medicine

## 2024-02-17 ENCOUNTER — Emergency Department
Admission: EM | Admit: 2024-02-17 | Discharge: 2024-02-17 | Disposition: A | Discharge status: Home / Self Care | Attending: Emergency Medicine | Admitting: Emergency Medicine

## 2024-02-17 DIAGNOSIS — L02416 Cutaneous abscess of left lower limb: Secondary | ICD-10-CM | POA: Diagnosis present

## 2024-02-17 DIAGNOSIS — L720 Epidermal cyst: Secondary | ICD-10-CM | POA: Diagnosis not present

## 2024-02-17 DIAGNOSIS — L089 Local infection of the skin and subcutaneous tissue, unspecified: Secondary | ICD-10-CM

## 2024-02-17 DIAGNOSIS — I10 Essential (primary) hypertension: Secondary | ICD-10-CM | POA: Diagnosis not present

## 2024-02-17 MED ORDER — DOXYCYCLINE HYCLATE 100 MG PO CAPS: 100.0000 mg | ORAL_CAPSULE | Freq: Two times a day (BID) | ORAL | 0 refills | Status: AC

## 2024-02-17 MED ORDER — LIDOCAINE HCL (PF) 1 % IJ SOLN
5.0000 mL | Freq: Once | INTRAMUSCULAR | Status: AC
  Administered 2024-02-17: 5 mL via INTRADERMAL
  Filled 2024-02-17: qty 5

## 2024-02-17 NOTE — ED Triage Notes (Signed)
 Left thigh boil. Has been there for about 3 months, became inflamed and irritated on Sunday.

## 2024-02-17 NOTE — Discharge Instructions (Addendum)
 Please take the antibiotic as prescribed.   I believe you had an infected cyst. Most of the cyst capsule was removed today. It is possible that small pieces were left behind and another one may develop in a few months. You can follow up with general surgery or dermatology to have this removed in the future.   Leave the dressing applied today intact for 2 days if possible, then removed the gauze. Please wash this with soap and water. Then cover with a bandage. Watch for signs of infection including redness, warmth, swelling, pain and pus drainage.  If you develop any of these please return to the ED, urgent care or your primary care provider.

## 2024-02-17 NOTE — ED Notes (Signed)
 See triage note  Presents with possible abscess rea to inner left thigh  States she noticed this on Sunday Was able to get some pus from area this am  Area is red and swollen

## 2024-02-17 NOTE — ED Provider Notes (Signed)
 [facility] Provider Note    Event Date/Time   First MD Initiated Contact with Patient 02/17/24 1050     (approximate)   History   Abscess   HPI  Vanessa Franco is a 43 y.o. female PMH of hypertension, anxiety and depression presents for evaluation of an abscess to the left thigh.  Patient states that it started as a blackhead and had been present for 3 months without any irritation.  On Sunday it became irritated and inflamed.  She squeezed out quite a bit of pus this morning but came to the ER as it is still causing her pain.      Physical Exam   Triage Vital Signs: ED Triage Vitals  Encounter Vitals Group     BP 02/17/24 1033 (!) 175/107     Systolic BP Percentile --      Diastolic BP Percentile --      Pulse Rate 02/17/24 1033 (!) 106     Resp 02/17/24 1033 16     Temp 02/17/24 1033 98.5 F (36.9 C)     Temp Source 02/17/24 1033 Oral     SpO2 02/17/24 1033 100 %     Weight 02/17/24 1032 212 lb 15.4 oz (96.6 kg)     Height 02/17/24 1109 5\' 7"  (1.702 m)     Head Circumference --      Peak Flow --      Pain Score 02/17/24 1032 9     Pain Loc --      Pain Education --      Exclude from Growth Chart --     Most recent vital signs: Vitals:   02/17/24 1033 02/17/24 1238  BP: (!) 175/107 (!) 168/99  Pulse: (!) 106 90  Resp: 16 18  Temp: 98.5 F (36.9 C)   SpO2: 100% 100%   General: Awake, no distress.  CV:  Good peripheral perfusion.  Resp:  Normal effort.  Abd:  No distention.  Other:  Boil to the left medial thigh with small puncture site, surrounding erythema, induration and small amount of fluctuance   ED Results / Procedures / Treatments   Labs (all labs ordered are listed, but only abnormal results are displayed) Labs Reviewed - No data to display   PROCEDURES:  Critical Care performed: No  .Ultrasound ED Soft Tissue  Date/Time: 02/17/2024 11:33 AM  Performed by: Phyliss Breen, PA-C Authorized by:  Phyliss Breen, PA-C   Procedure details:    Indications: localization of abscess     Transverse view:  Visualized   Longitudinal view:  Visualized   Images: not archived   Location:    Location: lower extremity     Side:  Left Findings:     abscess present .Incision and Drainage  Date/Time: 02/17/2024 3:23 PM  Performed by: Phyliss Breen, PA-C Authorized by: Phyliss Breen, PA-C   Consent:    Consent obtained:  Verbal   Consent given by:  Patient   Risks, benefits, and alternatives were discussed: yes     Risks discussed:  Bleeding, incomplete drainage, pain and infection   Alternatives discussed:  No treatment Universal protocol:    Patient identity confirmed:  Verbally with patient Location:    Type:  Cyst   Size:  1 cm   Location:  Lower extremity   Lower extremity location:  Leg   Leg location:  L upper leg Pre-procedure details:    Skin preparation:  Povidone-iodine  Sedation:  Sedation type:  None Anesthesia:    Anesthesia method:  Local infiltration   Local anesthetic:  Lidocaine  1% w/o epi Procedure type:    Complexity:  Simple Procedure details:    Incision types:  Single straight   Incision depth:  Subcutaneous   Wound management:  Irrigated with saline   Drainage:  Bloody   Drainage amount:  Scant   Wound treatment:  Wound left open   Packing material: guaze. Post-procedure details:    Procedure completion:  Tolerated well, no immediate complications    MEDICATIONS ORDERED IN ED: Medications  lidocaine  (PF) (XYLOCAINE ) 1 % injection 5 mL (5 mLs Intradermal Given by Other 02/17/24 1212)     IMPRESSION / MDM / ASSESSMENT AND PLAN / ED COURSE  I reviewed the triage vital signs and the nursing notes.                             43 year old female presents for evaluation of an abscess to the left thigh.  Patient is hypertensive and tachycardic but is quite uncomfortable on exam.  Differential diagnosis includes, but is not limited  to, folliculitis, cellulitis, abscess.  Patient's presentation is most consistent with acute, uncomplicated illness.  Patient has a history of chronic hypertension advised her to continue taking her BP medicine as prescribed and follow up with primary care. No report of symptoms related to this including chest pain, SOB, blurry vision or headache.  Boil was evaluated with bedside ultrasound as described in procedure note above.  There appears to be a small pocket of pus.  Gave patient the option of going home with antibiotics and doing warm compresses versus trying to open it up and drain it further here.  Patient would like this to be opened and drained.  We reviewed risks benefits and alternatives.  Please see procedure note for further details. There was a small amount of drainage but turned out to be a cyst. I was able to remove the capsule. I explained to the patient that since this was acutely inflamed she may have redevelopment of the cyst in the future as the capsule came out in pieces and there may be some left behind. Wound was packed with gauze and then covered with a bandage. She was instructed to remove the gauze after 2 days. She was provided with general surgery and dermatology follow up information. We discussed wound care.  Will cover with oral antibiotics.  Patient voiced understanding, all questions were answered she was stable at discharge.       FINAL CLINICAL IMPRESSION(S) / ED DIAGNOSES   Final diagnoses:  Infected cyst of skin     Rx / DC Orders   ED Discharge Orders          Ordered    doxycycline  (VIBRAMYCIN ) 100 MG capsule  2 times daily        02/17/24 1227             Note:  This document was prepared using Dragon voice recognition software and may include unintentional dictation errors.   Phyliss Breen, PA-C 02/17/24 1528    Claria Crofts, MD 02/21/24 1517

## 2024-04-17 ENCOUNTER — Other Ambulatory Visit: Payer: Self-pay | Admitting: Nurse Practitioner

## 2024-04-17 DIAGNOSIS — Z1231 Encounter for screening mammogram for malignant neoplasm of breast: Secondary | ICD-10-CM

## 2024-04-18 ENCOUNTER — Ambulatory Visit: Admitting: Sleep Medicine

## 2024-04-18 ENCOUNTER — Encounter

## 2024-04-25 ENCOUNTER — Ambulatory Visit: Admitting: Sleep Medicine

## 2024-05-01 ENCOUNTER — Ambulatory Visit: Admitting: Sleep Medicine

## 2024-06-20 NOTE — Progress Notes (Deleted)
 GYNECOLOGY ANNUAL PHYSICAL EXAM PROGRESS NOTE  Subjective:    Vanessa Franco is a 43 y.o. [id number] female who presents for an annual exam.  The patient {is/is not/has never been:13135} sexually active. The patient participates in regular exercise: {yes/no/not asked:9010}. Has the patient ever been transfused or tattooed?: {yes/no/not asked:9010}. The patient reports that there {is/is not:9024} domestic violence in her life.   The patient has the following complaints today:   Menstrual History: Menarche age: *** No LMP recorded.     Gynecologic History:  Contraception: {method:5051} History of STI's:  Last Pap: 03/12/2021. Results were: normal.  Denies h/o abnormal pap smears. Last mammogram: 03/18/2024. Results were: normal       OB History  Gravida Para Term Preterm AB Living  4 2 2  0 2 2  SAB IAB Ectopic Multiple Live Births  2 0 0 0 2    # Outcome Date GA Lbr Len/2nd Weight Sex Type Anes PTL Lv  4 Term 02/11/20 110w0d  5 lb 15.6 oz (2.71 kg) M Vag-Spont EPI N LIV     Complications: Gestational diabetes mellitus (GDM) in childbirth, diet controlled, Chronic hypertension, Advanced maternal age in multigravida     Apgar1: 8  Apgar5: 9  3 Term 08/16/16 [redacted]w[redacted]d 09:15 / 01:20 5 lb 15.9 oz (2.72 kg) F Vag-Spont EPI  LIV     Name: Handa,GIRL Bobbette     Apgar1: 8  Apgar5: 9  2 SAB 2002        FD  1 SAB 2001        FD    Past Medical History:  Diagnosis Date   Anxiety    Chronic hypertension 06/23/2019   COVID-19 virus infection 09/13/2020   Depression    Fibroid    Gestational diabetes    History of D&C    History of uterine fibroid 11/2013   [facility]    Past Surgical History:  Procedure Laterality Date   DILATION AND CURETTAGE OF UTERUS     LAPAROSCOPIC TUBAL LIGATION Bilateral 11/25/2020   Procedure: LAPAROSCOPIC TUBAL LIGATION;  Surgeon: Connell Davies, MD;  Location: [facility] ORS;  Service: Gynecology;  Laterality: Bilateral;   MYOMECTOMY  2015   Hysteroscopic     Family History  Problem Relation Age of Onset   Rheum arthritis Mother    Hypertension Mother    Diabetes Father    Kidney failure Father    Heart failure Father    Breast cancer Maternal Grandmother    Breast cancer Cousin 3       maternal    Social History   Socioeconomic History   Marital status: Single    Spouse name: Not on file   Number of children: Not on file   Years of education: Not on file   Highest education level: Not on file  Occupational History   Occupation: sales    Employer: [employer]  Tobacco Use   Smoking status: Some Days    Current packs/day: 0.25    Types: Cigarettes   Smokeless tobacco: Never  Vaping Use   Vaping status: Never Used  Substance and Sexual Activity   Alcohol use: Not Currently    Alcohol/week: 0.0 standard drinks of alcohol    Comment: occass   Drug use: Not Currently    Types: Marijuana   Sexual activity: Yes    Partners: Male    Birth control/protection: Surgical  Other Topics Concern   Not on file  Social History Narrative  Not on file   Social Drivers of Health   Financial Resource Strain: Not on file  Food Insecurity: Not on file  Transportation Needs: Not on file  Physical Activity: Not on file  Stress: Not on file  Social Connections: Not on file  Intimate Partner Violence: Not on file    Current Outpatient Medications on File Prior to Visit  Medication Sig Dispense Refill   amLODipine (NORVASC) 10 MG tablet Take 10 mg by mouth daily.     hydrochlorothiazide (HYDRODIURIL) 25 MG tablet Take 25 mg by mouth daily. (Patient not taking: Reported on 01/01/2023)     meloxicam (MOBIC) 15 MG tablet Take 15 mg by mouth daily.     metroNIDAZOLE  (NUVESSA ) 1.3 % GEL PLACE 1 APPLICATORFUL VAGINALLY ONCE FOR 1 DOSE     naproxen  (NAPROSYN ) 500 MG tablet Take 500 mg by mouth 2 (two) times daily with a meal.     nicotine  (NICODERM CQ  - DOSED IN MG/24 HOURS) 14 mg/24hr patch 14 mg daily. (Patient not taking:  Reported on 01/01/2023)     nicotine  (NICODERM CQ  - DOSED IN MG/24 HR) 7 mg/24hr patch 7 mg daily. (Patient not taking: Reported on 01/01/2023)     omeprazole (PRILOSEC) 40 MG capsule Take 40 mg by mouth daily.     tiZANidine (ZANAFLEX) 2 MG tablet Take 2 mg by mouth every 8 (eight) hours as needed for muscle spasms.     traZODone (DESYREL) 50 MG tablet Take 50 mg by mouth at bedtime as needed.     WELLBUTRIN  SR 150 MG 12 hr tablet Take 150 mg by mouth daily.     No current facility-administered medications on file prior to visit.    Allergies  Allergen Reactions   Ketorolac Other (See Comments)    Swelling, water retention   Elemental Sulfur Rash   Sulfa Antibiotics Rash, Dermatitis and Other (See Comments)    blisters     Review of Systems Constitutional: negative for chills, fatigue, fevers and sweats Eyes: negative for irritation, redness and visual disturbance Ears, nose, mouth, throat, and face: negative for hearing loss, nasal congestion, snoring and tinnitus Respiratory: negative for asthma, cough, sputum Cardiovascular: negative for chest pain, dyspnea, exertional chest pressure/discomfort, irregular heart beat, palpitations and syncope Gastrointestinal: negative for abdominal pain, change in bowel habits, nausea and vomiting Genitourinary: negative for abnormal menstrual periods, genital lesions, sexual problems and vaginal discharge, dysuria and urinary incontinence Integument/breast: negative for breast lump, breast tenderness and nipple discharge Hematologic/lymphatic: negative for bleeding and easy bruising Musculoskeletal:negative for back pain and muscle weakness Neurological: negative for dizziness, headaches, vertigo and weakness Endocrine: negative for diabetic symptoms including polydipsia, polyuria and skin dryness Allergic/Immunologic: negative for hay fever and urticaria      Objective:  There were no vitals taken for this visit. There is no height or weight  on file to calculate BMI.    General Appearance:    Alert, cooperative, no distress, appears stated age  Head:    Normocephalic, without obvious abnormality, atraumatic  Eyes:    PERRL, conjunctiva/corneas clear, EOM's intact, both eyes  Ears:    Normal external ear canals, both ears  Nose:   Nares normal, septum midline, mucosa normal, no drainage or sinus tenderness  Throat:   Lips, mucosa, and tongue normal; teeth and gums normal  Neck:   Supple, symmetrical, trachea midline, no adenopathy; thyroid: no enlargement/tenderness/nodules; no carotid bruit or JVD  Back:     Symmetric, no curvature, ROM normal, no  CVA tenderness  Lungs:     Clear to auscultation bilaterally, respirations unlabored  Chest Wall:    No tenderness or deformity   Heart:    Regular rate and rhythm, S1 and S2 normal, no murmur, rub or gallop  Breast Exam:    No tenderness, masses, or nipple abnormality  Abdomen:     Soft, non-tender, bowel sounds active all four quadrants, no masses, no organomegaly.    Genitalia:    Pelvic:external genitalia normal, vagina without lesions, discharge, or tenderness, rectovaginal septum  normal. Cervix normal in appearance, no cervical motion tenderness, no adnexal masses or tenderness.  Uterus normal size, shape, mobile, regular contours, nontender.  Rectal:    Normal external sphincter.  No hemorrhoids appreciated. Internal exam not done.   Extremities:   Extremities normal, atraumatic, no cyanosis or edema  Pulses:   2+ and symmetric all extremities  Skin:   Skin color, texture, turgor normal, no rashes or lesions  Lymph nodes:   Cervical, supraclavicular, and axillary nodes normal  Neurologic:   CNII-XII intact, normal strength, sensation and reflexes throughout   .  Labs:  Lab Results  Component Value Date   WBC 7.5 03/12/2021   HGB 13.8 03/12/2021   HCT 41.6 03/12/2021   MCV 92 03/12/2021   PLT 194 03/12/2021    Lab Results  Component Value Date   CREATININE 0.71  03/12/2021   BUN 7 03/12/2021   NA 136 03/12/2021   K 3.8 03/12/2021   CL 98 03/12/2021   CO2 22 03/12/2021    Lab Results  Component Value Date   ALT 12 03/12/2021   AST 14 03/12/2021   ALKPHOS 70 03/12/2021   BILITOT 1.0 03/12/2021    Lab Results  Component Value Date   TSH 0.827 03/12/2021     Assessment:   No diagnosis found.   Plan:  Blood tests: {blood tests:13147}. Breast self exam technique reviewed and patient encouraged to perform self-exam monthly. Contraception: {contraceptive methods:5051}. Discussed healthy lifestyle modifications. Mammogram {discussed/ordered:14545} Pap smear ordered. Flu vaccine: Follow up in 1 year for annual exam   Damien Parsley, CNM [facility]

## 2024-06-20 NOTE — Patient Instructions (Incomplete)
 Preventive Care 43-43 Years Old, Female Preventive care refers to lifestyle choices and visits with your health care provider that can promote health and wellness. Preventive care visits are also called wellness exams. What can I expect for my preventive care visit? Counseling Your health care provider may ask you questions about your: Medical history, including: Past medical problems. Family medical history. Pregnancy history. Current health, including: Menstrual cycle. Method of birth control. Emotional well-being. Home life and relationship well-being. Sexual activity and sexual health. Lifestyle, including: Alcohol, nicotine or tobacco, and drug use. Access to firearms. Diet, exercise, and sleep habits. Work and work Astronomer. Sunscreen use. Safety issues such as seatbelt and bike helmet use. Physical exam Your health care provider will check your: Height and weight. These may be used to calculate your BMI (body mass index). BMI is a measurement that tells if you are at a healthy weight. Waist circumference. This measures the distance around your waistline. This measurement also tells if you are at a healthy weight and may help predict your risk of certain diseases, such as type 2 diabetes and high blood pressure. Heart rate and blood pressure. Body temperature. Skin for abnormal spots. What immunizations do I need?  Vaccines are usually given at various ages, according to a schedule. Your health care provider will recommend vaccines for you based on your age, medical history, and lifestyle or other factors, such as travel or where you work. What tests do I need? Screening Your health care provider may recommend screening tests for certain conditions. This may include: Lipid and cholesterol levels. Diabetes screening. This is done by checking your blood sugar (glucose) after you have not eaten for a while (fasting). Pelvic exam and Pap test. Hepatitis B test. Hepatitis C  test. HIV (human immunodeficiency virus) test. STI (sexually transmitted infection) testing, if you are at risk. Lung cancer screening. Colorectal cancer screening. Mammogram. Talk with your health care provider about when you should start having regular mammograms. This may depend on whether you have a family history of breast cancer. BRCA-related cancer screening. This may be done if you have a family history of breast, ovarian, tubal, or peritoneal cancers. Bone density scan. This is done to screen for osteoporosis. Talk with your health care provider about your test results, treatment options, and if necessary, the need for more tests. Follow these instructions at home: Eating and drinking  Eat a diet that includes fresh fruits and vegetables, whole grains, lean protein, and low-fat dairy products. Take vitamin and mineral supplements as recommended by your health care provider. Do not drink alcohol if: Your health care provider tells you not to drink. You are pregnant, may be pregnant, or are planning to become pregnant. If you drink alcohol: Limit how much you have to 0-1 drink a day. Know how much alcohol is in your drink. In the [place] one drink equals one 12 oz bottle of beer (355 mL), one 5 oz glass of wine (148 mL), or one 1 oz glass of hard liquor (44 mL). Lifestyle Brush your teeth every morning and night with fluoride toothpaste. Floss one time each day. Exercise for at least 30 minutes 5 or more days each week. Do not use any products that contain nicotine or tobacco. These products include cigarettes, chewing tobacco, and vaping devices, such as e-cigarettes. If you need help quitting, ask your health care provider. Do not use drugs. If you are sexually active, practice safe sex. Use a condom or other form of protection to  prevent STIs. If you do not wish to become pregnant, use a form of birth control. If you plan to become pregnant, see your health care provider for a  prepregnancy visit. Take aspirin only as told by your health care provider. Make sure that you understand how much to take and what form to take. Work with your health care provider to find out whether it is safe and beneficial for you to take aspirin daily. Find healthy ways to manage stress, such as: Meditation, yoga, or listening to music. Journaling. Talking to a trusted person. Spending time with friends and family. Minimize exposure to UV radiation to reduce your risk of skin cancer. Safety Always wear your seat belt while driving or riding in a vehicle. Do not drive: If you have been drinking alcohol. Do not ride with someone who has been drinking. When you are tired or distracted. While texting. If you have been using any mind-altering substances or drugs. Wear a helmet and other protective equipment during sports activities. If you have firearms in your house, make sure you follow all gun safety procedures. Seek help if you have been physically or sexually abused. What's next? Visit your health care provider once a year for an annual wellness visit. Ask your health care provider how often you should have your eyes and teeth checked. Stay up to date on all vaccines. This information is not intended to replace advice given to you by your health care provider. Make sure you discuss any questions you have with your health care provider. Document Revised: 03/05/2021 Document Reviewed: 03/05/2021 Elsevier Patient Education  2024 [employer] How to Do a Breast Self-Exam Doing breast self-exams can help you stay healthy. They're one way to know what's normal for your breasts. They can help you catch a problem while it's still small and can be treated. You need to: Check your breasts often. Tell your doctor about any changes. You should do breast self-exams even if you have breast implants. What you need: A mirror. A well-lit room. A pillow or other soft object. How to do a  breast self-exam Look for changes  Take off all the clothes above your waist. Stand in front of a mirror in a room with good lighting. Put your hands down at your sides. Compare your breasts in the mirror. Look for difference between them, such as: Differences in shape. Differences in size. Wrinkles, dips, and bumps in one breast and not the other. Look at each breast for skin changes, such as: Redness. Scaly spots. Spots where your skin is thicker. Dimpling. Open sores. Look for changes in your nipples, such as: Fluid coming out of a nipple. Fluid around a nipple. Bleeding. Dimpling. Redness. A nipple that looks pushed in or that has changed position. Feel for changes Lie on your back. Feel each breast. To do this: Pick a breast to feel. Place a pillow under the shoulder closest to that breast. Put the arm closest to that breast behind your head. Feel the breast using the hand of your other arm. Use the pads of your three middle fingers to make small circles starting near the nipple. Use light, medium, and firm pressure. Keep making circles, moving down over the breast. Stop when you feel your ribs. Start making circles with your fingers again, this time going up until you reach your collarbone. Then, make circles out across your breast and into your armpit area. Squeeze your nipple. Check for fluid and lumps. Do these steps again  to check your other breast. Sit or stand in the tub or shower. With soapy water on your skin, feel each breast the same way you did when you were lying down. Write down what you find Writing down what you find can help you keep track of what you want to tell your doctor. Write down: What's normal for each breast. Any changes you find. Write down: The kind of change. If your breast feels tender or painful. Any lump you find. Write down its size and where it is. When you last had your period. General tips If you're breastfeeding, the best time  to check your breasts is after you feed your baby or after you use a breast pump. If you get a period, the best time to check your breasts is 5-7 days after your period ends. With time, you'll get more used to doing the self-exam. You'll also start to know if there are changes in your breasts. Contact a doctor if: You see a change in the shape or size of your breasts or nipples. You see a change in the skin of your breast or nipples. You have fluid coming from your nipples that isn't normal. You find a new lump or thick area. You have breast pain. You have any concerns about your breast health. This information is not intended to replace advice given to you by your health care provider. Make sure you discuss any questions you have with your health care provider. Document Revised: 11/17/2023 Document Reviewed: 11/17/2023 Elsevier Patient Education  2025 [employer].

## 2024-06-21 ENCOUNTER — Ambulatory Visit: Admitting: Certified Nurse Midwife

## 2024-06-23 ENCOUNTER — Emergency Department
Admission: EM | Admit: 2024-06-23 | Discharge: 2024-06-23 | Disposition: A | Discharge status: Home / Self Care | Attending: Emergency Medicine | Admitting: Emergency Medicine

## 2024-06-23 ENCOUNTER — Other Ambulatory Visit: Payer: Self-pay

## 2024-06-23 ENCOUNTER — Encounter: Payer: Self-pay | Admitting: Emergency Medicine

## 2024-06-23 DIAGNOSIS — R519 Headache, unspecified: Secondary | ICD-10-CM | POA: Diagnosis present

## 2024-06-23 DIAGNOSIS — I10 Essential (primary) hypertension: Secondary | ICD-10-CM | POA: Insufficient documentation

## 2024-06-23 LAB — CBC WITH DIFFERENTIAL/PLATELET
Abs Immature Granulocytes: 0.01 K/uL (ref 0.00–0.07)
Basophils Absolute: 0 K/uL (ref 0.0–0.1)
Basophils Relative: 1 %
Eosinophils Absolute: 0.1 K/uL (ref 0.0–0.5)
Eosinophils Relative: 2 %
HCT: 40.6 % (ref 36.0–46.0)
Hemoglobin: 13.6 g/dL (ref 12.0–15.0)
Immature Granulocytes: 0 %
Lymphocytes Relative: 34 %
Lymphs Abs: 2.2 K/uL (ref 0.7–4.0)
MCH: 30.4 pg (ref 26.0–34.0)
MCHC: 33.5 g/dL (ref 30.0–36.0)
MCV: 90.8 fL (ref 80.0–100.0)
Monocytes Absolute: 0.5 K/uL (ref 0.1–1.0)
Monocytes Relative: 7 %
Neutro Abs: 3.6 K/uL (ref 1.7–7.7)
Neutrophils Relative %: 56 %
Platelets: 224 K/uL (ref 150–400)
RBC: 4.47 MIL/uL (ref 3.87–5.11)
RDW: 12.1 % (ref 11.5–15.5)
WBC: 6.5 K/uL (ref 4.0–10.5)
nRBC: 0 % (ref 0.0–0.2)

## 2024-06-23 LAB — BASIC METABOLIC PANEL WITH GFR
Anion gap: 10 (ref 5–15)
BUN: 13 mg/dL (ref 6–20)
CO2: 25 mmol/L (ref 22–32)
Calcium: 8.6 mg/dL — ABNORMAL LOW (ref 8.9–10.3)
Chloride: 103 mmol/L (ref 98–111)
Creatinine, Ser: 0.6 mg/dL (ref 0.44–1.00)
GFR, Estimated: >60 mL/min (ref >60)
Glucose, Bld: 120 mg/dL — ABNORMAL HIGH (ref 70–99)
Potassium: 4 mmol/L (ref 3.5–5.1)
Sodium: 138 mmol/L (ref 135–145)

## 2024-06-23 LAB — RESP PANEL BY RT-PCR (RSV, FLU A&B, COVID)  RVPGX2
Influenza A by PCR: NEGATIVE
Influenza B by PCR: NEGATIVE
Resp Syncytial Virus by PCR: NEGATIVE
SARS Coronavirus 2 by RT PCR: NEGATIVE

## 2024-06-23 MED ORDER — METOCLOPRAMIDE HCL 5 MG/ML IJ SOLN
10.0000 mg | Freq: Once | INTRAMUSCULAR | Status: AC
  Administered 2024-06-23: 10 mg via INTRAVENOUS
  Filled 2024-06-23: qty 2

## 2024-06-23 MED ORDER — ACETAMINOPHEN 500 MG PO TABS
1000.0000 mg | ORAL_TABLET | Freq: Once | ORAL | Status: AC
  Administered 2024-06-23: 1000 mg via ORAL
  Filled 2024-06-23: qty 2

## 2024-06-23 MED ORDER — BUTALBITAL-APAP-CAFFEINE 50-325-40 MG PO TABS: 1.0000 | ORAL_TABLET | Freq: Four times a day (QID) | ORAL | 0 refills | Status: AC | PRN

## 2024-06-23 MED ORDER — DIPHENHYDRAMINE HCL 50 MG/ML IJ SOLN
50.0000 mg | Freq: Once | INTRAMUSCULAR | Status: AC
  Administered 2024-06-23: 50 mg via INTRAVENOUS
  Filled 2024-06-23: qty 1

## 2024-06-23 MED ORDER — SODIUM CHLORIDE 0.9 % IV BOLUS
1000.0000 mL | Freq: Once | INTRAVENOUS | Status: AC
  Administered 2024-06-23: 1000 mL via INTRAVENOUS

## 2024-06-23 NOTE — ED Triage Notes (Signed)
 Pt reports intermittent headaches all week but severe past 2 days. Endorses light sensitivity and nausea. Pain mainly to right side of head. Pt BP 131/102 and has taken HTN meds today.

## 2024-06-23 NOTE — ED Provider Notes (Signed)
 [facility] Provider Note    Event Date/Time   First MD Initiated Contact with Patient 06/23/24 1627     (approximate)  History   Chief Complaint: Headache  HPI  DAEJA HELDERMAN is a 43 y.o. female with a past medical history of anxiety, depression, hypertension, presents emergency department for headache.  According to the patient over the past week or so she has had a intermittent but worsening headache.  States over last 24 hours it has been more significant.  Patient denies any weakness or numbness of any arm or leg denies any fever cough or congestion but states her son did recently have rhinovirus.  Patient has a borderline low-grade temperature 99.5 in the emergency department.  Patient denies any nausea or vomiting no chest pain abdominal pain.  Physical Exam   Triage Vital Signs: ED Triage Vitals  Encounter Vitals Group     BP 06/23/24 1528 (!) 131/102     Girls Systolic BP Percentile --      Girls Diastolic BP Percentile --      Boys Systolic BP Percentile --      Boys Diastolic BP Percentile --      Pulse Rate 06/23/24 1528 97     Resp 06/23/24 1528 16     Temp 06/23/24 1528 99.5 F (37.5 C)     Temp Source 06/23/24 1528 Oral     SpO2 06/23/24 1528 100 %     Weight 06/23/24 1529 211 lb 10.3 oz (96 kg)     Height 06/23/24 1529 5' 7 (1.702 m)     Head Circumference --      Peak Flow --      Pain Score 06/23/24 1529 10     Pain Loc --      Pain Education --      Exclude from Growth Chart --     Most recent vital signs: Vitals:   06/23/24 1528  BP: (!) 131/102  Pulse: 97  Resp: 16  Temp: 99.5 F (37.5 C)  SpO2: 100%    General: Awake, no distress.  CV:  Good peripheral perfusion.  Regular rate and rhythm  Resp:  Normal effort.  Equal breath sounds bilaterally.  Abd:  No distention.  Soft, nontender.  No rebound or guarding. Other:  Equal grip strength bilaterally, no pronator drift, no cranial nerve deficits.   ED Results  / Procedures / Treatments   MEDICATIONS ORDERED IN ED: Medications  metoCLOPramide (REGLAN) injection 10 mg (has no administration in time range)  diphenhydrAMINE  (BENADRYL ) injection 50 mg (has no administration in time range)  sodium chloride  0.9 % bolus 1,000 mL (has no administration in time range)  acetaminophen  (TYLENOL ) tablet 1,000 mg (has no administration in time range)     IMPRESSION / MDM / ASSESSMENT AND PLAN / ED COURSE  I reviewed the triage vital signs and the nursing notes.  Patient's presentation is most consistent with acute presentation with potential threat to life or bodily function.  Patient presents emergency department for intermittent headache over the past 1 week but progressively worsening.  Patient does have a borderline low-grade temperature in the emergency department 99.5.  We will check a COVID/flu swab as a precaution.  We will treat the patient with Tylenol /Reglan/Benadryl /fluids.  Patient's workup today shows a reassuring CBC with a normal white blood cell count, reassuring chemistry.  No concerning findings on physical exam.  Patient's workup is reassuring, respiratory panel has resulted negative CBC and  chemistry are normal.  Patient is feeling much better after medications and fluids.  We will discharge the patient home have her follow-up with her doctor.  Discussed my typical headache return precautions.  Patient agreeable to plan.  FINAL CLINICAL IMPRESSION(S) / ED DIAGNOSES   Headache    Note:  This document was prepared using Dragon voice recognition software and may include unintentional dictation errors.   Dorothyann Drivers, MD 06/23/24 1758

## 2024-07-03 ENCOUNTER — Ambulatory Visit: Admitting: Licensed Practical Nurse

## 2024-07-20 ENCOUNTER — Ambulatory Visit: Admitting: Sleep Medicine

## 2024-08-01 ENCOUNTER — Encounter: Payer: Self-pay | Admitting: Sleep Medicine

## 2024-08-01 ENCOUNTER — Ambulatory Visit: Admitting: Sleep Medicine

## 2024-08-01 VITALS — BP 112/68 | HR 87 | Temp 97.8°F | Ht 67.0 in | Wt 210.2 lb

## 2024-08-01 DIAGNOSIS — G4733 Obstructive sleep apnea (adult) (pediatric): Secondary | ICD-10-CM | POA: Diagnosis not present

## 2024-08-01 DIAGNOSIS — Z6832 Body mass index (BMI) 32.0-32.9, adult: Secondary | ICD-10-CM | POA: Diagnosis not present

## 2024-08-01 DIAGNOSIS — I10 Essential (primary) hypertension: Secondary | ICD-10-CM

## 2024-08-01 DIAGNOSIS — E669 Obesity, unspecified: Secondary | ICD-10-CM | POA: Diagnosis not present

## 2024-08-01 NOTE — Progress Notes (Signed)
 Name:Vanessa Franco MRN: [id number] DOB: [birth year]   CHIEF COMPLAINT:  EXCESSIVE DAYTIME SLEEPINESS   HISTORY OF PRESENT ILLNESS: Vanessa Franco is a 43 y.o. w/ a h/o HTN, GERD and obesity who presents for c/o loud snoring which has been present for several years. Reports nocturnal awakenings due to unclear reasons and has difficulty falling back to sleep. Reports significant weight changes. Denies morning headaches, RLS symptoms, dream enactment, cataplexy, hypnagogic or hypnapompic hallucinations. Denies a family history of sleep apnea. Denies drowsy driving. Reports occasional alcohol use, smokes 7-8 cigarettes daily, denies illicit drug use.   Bedtime 9 pm Sleep onset several hours Rise time 6 am   EPWORTH SLEEP SCORE 2    08/01/2024    2:19 PM  Results of the Epworth flowsheet  Sitting and reading 0  Watching TV 2  Sitting, inactive in a public place (e.g. a theatre or a meeting) 0  As a passenger in a car for an hour without a break 0  Lying down to rest in the afternoon when circumstances permit 0  Sitting and talking to someone 0  Sitting quietly after a lunch without alcohol 0  In a car, while stopped for a few minutes in traffic 0  Total score 2    PAST MEDICAL HISTORY :   has a past medical history of Anxiety, Chronic hypertension (06/23/2019), COVID-19 virus infection (09/13/2020), Depression, Fibroid, Gestational diabetes, History of D&C, and History of uterine fibroid (11/2013).  has a past surgical history that includes Myomectomy (2015); Dilation and curettage of uterus; and Laparoscopic tubal ligation (Bilateral, 11/25/2020). Prior to Admission medications   Medication Sig Start Date End Date Taking? Authorizing Provider  amLODipine (NORVASC) 10 MG tablet Take 10 mg by mouth daily.   Yes [provider]  butalbital-acetaminophen -caffeine (FIORICET) 50-325-40 MG tablet Take 1-2 tablets by mouth every 6 (six) hours as needed for headache. 06/23/24  06/23/25 Yes Dorothyann Drivers, MD  hydrochlorothiazide (HYDRODIURIL) 25 MG tablet Take 25 mg by mouth daily. 07/04/24  Yes [provider]  losartan (COZAAR) 25 MG tablet Take 25 mg by mouth daily. 07/04/24  Yes [provider]  naproxen  (NAPROSYN ) 500 MG tablet Take 500 mg by mouth 2 (two) times daily with a meal. 10/30/23 10/29/24 Yes [provider]  omeprazole (PRILOSEC) 40 MG capsule Take 40 mg by mouth daily. Patient taking differently: Take 40 mg by mouth as needed.   Yes [provider]  tiZANidine (ZANAFLEX) 2 MG tablet Take 2 mg by mouth every 8 (eight) hours as needed for muscle spasms. 04/15/24  Yes [provider]  traZODone (DESYREL) 50 MG tablet Take 50 mg by mouth at bedtime as needed. 04/12/24  Yes [provider]   Allergies  Allergen Reactions   Ketorolac Other (See Comments)    Swelling, water retention   Elemental Sulfur Rash   Sulfa Antibiotics Rash, Dermatitis and Other (See Comments)    blisters    FAMILY HISTORY:  family history includes Breast cancer in her maternal grandmother; Breast cancer (age of onset: 15) in her cousin; Diabetes in her father; Heart failure in her father; Hypertension in her mother; Kidney failure in her father; Rheum arthritis in her mother. SOCIAL HISTORY:  reports that she has been smoking cigarettes. She has never used smokeless tobacco. She reports that she does not currently use alcohol. She reports that she does not currently use drugs after having used the following drugs: Marijuana.   Review of  Systems:  Gen:  Denies  fever, sweats, chills weight loss  HEENT: Denies blurred vision, double vision, ear pain, eye pain, hearing loss, nose bleeds, sore throat Cardiac:  No dizziness, chest pain or heaviness, chest tightness,edema, No JVD Resp:   No cough, -sputum production, -shortness of breath,-wheezing, -hemoptysis,  Gi: Denies swallowing difficulty, stomach pain, nausea or vomiting,  diarrhea, constipation, bowel incontinence Gu:  Denies bladder incontinence, burning urine Ext:   Denies Joint pain, stiffness or swelling Skin: Denies  skin rash, easy bruising or bleeding or hives Endoc:  Denies polyuria, polydipsia , polyphagia or weight change Psych:   Denies depression, insomnia or hallucinations  Other:  All other systems negative  VITAL SIGNS: BP 112/68   Pulse 87   Temp 97.8 F (36.6 C)   Ht 5' 7 (1.702 m)   Wt 210 lb 3.2 oz (95.3 kg)   SpO2 100%   BMI 32.92 kg/m    Physical Examination:   General Appearance: No distress  EYES PERRLA, EOM intact.   NECK Supple, No JVD Pulmonary: normal breath sounds, No wheezing.  CardiovascularNormal S1,S2.  No m/r/g.   Abdomen: Benign, Soft, non-tender. Skin:   warm, no rashes, no ecchymosis  Extremities: normal, no cyanosis, clubbing. Neuro:without focal findings,  speech normal  PSYCHIATRIC: Mood, affect within normal limits.   ASSESSMENT AND PLAN  OSA I suspect that OSA is likely present due to clinical presentation. Discussed the consequences of untreated sleep apnea. Advised not to drive drowsy for safety of patient and others. Will complete further evaluation with a home sleep study and follow up to review results.    HTN Stable, on current management. Following with PCP.   Obesity Counseled patient on diet and lifestyle modification.    MEDICATION ADJUSTMENTS/LABS AND TESTS ORDERED: Recommend Sleep Study   Patient  satisfied with Plan of action and management. All questions answered  Follow up to review HST results and treatment plan.   I spent a total of 30 minutes reviewing chart data, face-to-face evaluation with the patient, counseling and coordination of care as detailed above.    Emanual Lamountain, M.D.  Sleep Medicine [facility]

## 2024-08-01 NOTE — Patient Instructions (Signed)
 SABRA

## 2024-08-11 ENCOUNTER — Ambulatory Visit (INDEPENDENT_AMBULATORY_CARE_PROVIDER_SITE_OTHER): Admitting: Licensed Practical Nurse

## 2024-08-11 ENCOUNTER — Other Ambulatory Visit (HOSPITAL_COMMUNITY)
Admission: RE | Admit: 2024-08-11 | Discharge: 2024-08-11 | Disposition: A | Source: Ambulatory Visit | Attending: Licensed Practical Nurse | Admitting: Licensed Practical Nurse

## 2024-08-11 ENCOUNTER — Encounter: Payer: Self-pay | Admitting: Licensed Practical Nurse

## 2024-08-11 VITALS — BP 137/83 | HR 93 | Ht 67.0 in | Wt 210.8 lb

## 2024-08-11 DIAGNOSIS — L720 Epidermal cyst: Secondary | ICD-10-CM

## 2024-08-11 DIAGNOSIS — Z0000 Encounter for general adult medical examination without abnormal findings: Secondary | ICD-10-CM | POA: Diagnosis present

## 2024-08-11 DIAGNOSIS — R232 Flushing: Secondary | ICD-10-CM | POA: Diagnosis not present

## 2024-08-11 DIAGNOSIS — Z319 Encounter for procreative management, unspecified: Secondary | ICD-10-CM

## 2024-08-11 DIAGNOSIS — Z3169 Encounter for other general counseling and advice on procreation: Secondary | ICD-10-CM

## 2024-08-11 DIAGNOSIS — Z113 Encounter for screening for infections with a predominantly sexual mode of transmission: Secondary | ICD-10-CM | POA: Diagnosis present

## 2024-08-11 DIAGNOSIS — Z124 Encounter for screening for malignant neoplasm of cervix: Secondary | ICD-10-CM | POA: Diagnosis present

## 2024-08-11 DIAGNOSIS — N898 Other specified noninflammatory disorders of vagina: Secondary | ICD-10-CM

## 2024-08-11 NOTE — Progress Notes (Unsigned)
 Gynecology Annual Exam  PCP: Osa Geralds, NP  Chief Complaint:  Chief Complaint  Patient presents with   Gynecologic Exam    History of Present Illness: Patient is a 43 y.o. Vanessa Franco presents for annual exam. The patient has some concerns today. She had a tubal ligation and desires pregnancy   She has noticed a bump in her opening.   When asked about potential menopausal symptoms she reports: Hot flashes,-daily, through out the day sometimes in her  her sleep, for the last 2 months  changes to cycles, sometimes heavy sometimes light . I can't lose weight esp in the middle  Unsure of when the women in her family went through menopause    LMP: Nov 10  Average Interval: regular, 30 days Duration of flow: 3 days, Oct cyle was 6 and heavier-needed to change products 4 times a day  Heavy Menses: no Clots: no Intermenstrual Bleeding: no Postcoital Bleeding: no Dysmenorrhea: no   The patient is sexually active 1 female partner. She currently uses tubal ligation for contraception. She denies dyspareunia.  The patient does perform self breast exams.  There is notable family history of breast or ovarian cancer in her family.  The patient wears seatbelts: yes.   The patient has regular exercise: yes.  Walking a lot at work, Chiropractor to lose weight, has made diaetey cahnges   The patient denies current symptoms of depression.   Works at [employer]  Lives with her children 7 and 4  Has partner he has 10 children ages 44 to 90, feels safe  PCP: seen recently  Dentist: next apt Jan Eye exam not sure   Review of Systems: ROS see HPI   Past Medical History:  Patient Active Problem List   Diagnosis Date Noted   Arthralgia of right knee 04/07/2022   Lumbar spondylosis 04/07/2022   Sacroiliac joint pain 04/07/2022   Sciatic pain, left 01/10/2020   Chronic hypertension 06/23/2019   Anxiety 06/23/2019   Tobacco abuse 02/04/2016   Infertility, female 11/22/2013     1) Ovulatory Dysfunction: - Given her menstrual history, she is unlikely to be anovulatory or oligo-ovulatory.   - She has previously used OPKs demonstrating ovulation monthly  2) Tubal/Uterine Factor: - On bedside TVU/S, no fibroid(s) present. - Based on her GYN history, tubal factor is likely with h/o laparoscopic surgery and patient-reported prior STD - Tubal factor may be likely, in which case a hysterosalpingogram is warranted to evaluate patency.  Will refer to [facility] for ongoing management and HSG.  3) Female Factor Infertility: - Based on brief partner history today, female factor infertility is less likely but cannot be ruled out.  Reportedly he has undergone semen analysis in the past but results are unknown and the patient was a poor historian regarding this. - Patient reports obtaining pregnancy with current partner, which ended in SAB  4) Decreased Ovarian Reserve: - AMH normal in 2015 when she initially presented - Will defer repeat AMH at this time  5) Preconception Counseling: - Encourage PNV and vitamin D supplementation - We discussed genetic screening for CF, SMA, and alpha thalassemia and sicke cell as screening options prior to pregnancy. - We also reviewed that she will have rubella and varicella titers drawn to evaluate immunity prior to pregnancy - Encouraged tobacco cessation and take-home education pamphlets were provided. - Weight loss encouraged with balanced diet and regular exercise 3 times weekly.     Past Surgical History:  Past Surgical History:  Procedure Laterality Date   DILATION AND CURETTAGE OF UTERUS     LAPAROSCOPIC TUBAL LIGATION Bilateral 11/25/2020   Procedure: LAPAROSCOPIC TUBAL LIGATION;  Surgeon: Connell Davies, MD;  Location: [facility] ORS;  Service: Gynecology;  Laterality: Bilateral;   MYOMECTOMY  2015   Hysteroscopic    Gynecologic History:  Patient's last menstrual period was 07/31/2024 (approximate). Contraception: tubal ligation Last  Pap: Results were: 03/12/2021 no abnormalities  Last mammogram: 2024 Results were: BI-RAD I  Obstetric History: Vanessa Franco  Family History:  Family History  Problem Relation Age of Onset   Rheum arthritis Mother    Hypertension Mother    Diabetes Father    Kidney failure Father    Heart failure Father    Breast cancer Maternal Grandmother    Breast cancer Cousin 38       maternal    Social History:  Social History   Socioeconomic History   Marital status: Single    Spouse name: Not on file   Number of children: Not on file   Years of education: Not on file   Highest education level: Not on file  Occupational History   Occupation: Investment Banker, Corporate: [employer]  Tobacco Use   Smoking status: Every Day    Current packs/day: 0.25    Types: Cigarettes   Smokeless tobacco: Never   Tobacco comments:    6-7 cigarettes a day- khj 08/01/2024  Vaping Use   Vaping status: Never Used  Substance and Sexual Activity   Alcohol use: Not Currently    Alcohol/week: 0.0 standard drinks of alcohol    Comment: occass   Drug use: Not Currently    Types: Marijuana   Sexual activity: Yes    Partners: Male    Birth control/protection: Surgical  Other Topics Concern   Not on file  Social History Narrative   Not on file   Social Drivers of Health   Financial Resource Strain: Not on file  Food Insecurity: Not on file  Transportation Needs: Not on file  Physical Activity: Not on file  Stress: Not on file  Social Connections: Not on file  Intimate Partner Violence: Not on file    Allergies:  Allergies  Allergen Reactions   Ketorolac Other (See Comments)    Swelling, water retention   Elemental Sulfur Rash   Sulfa Antibiotics Rash, Dermatitis and Other (See Comments)    blisters    Medications: Prior to Admission medications   Medication Sig Start Date End Date Taking? Authorizing Provider  amLODipine (NORVASC) 10 MG tablet Take 10 mg by mouth daily.    [provider]  butalbital -acetaminophen -caffeine  (FIORICET) 50-325-40 MG tablet Take 1-2 tablets by mouth every 6 (six) hours as needed for headache. 06/23/24 06/23/25  Paduchowski, Kevin, MD  hydrochlorothiazide (HYDRODIURIL) 25 MG tablet Take 25 mg by mouth daily. 07/04/24   [provider]  losartan (COZAAR) 25 MG tablet Take 25 mg by mouth daily. 07/04/24   [provider]  naproxen  (NAPROSYN ) 500 MG tablet Take 500 mg by mouth 2 (two) times daily with a meal. 10/30/23 10/29/24  [provider]  omeprazole (PRILOSEC) 40 MG capsule Take 40 mg by mouth daily. Patient taking differently: Take 40 mg by mouth as needed.    [provider]  tiZANidine (ZANAFLEX) 2 MG tablet Take 2 mg by mouth every 8 (eight) hours as needed for muscle spasms. 04/15/24   [provider]  traZODone (DESYREL) 50 MG  tablet Take 50 mg by mouth at bedtime as needed. 04/12/24   [provider]    Physical Exam Vitals: There were no vitals taken for this visit.  General: NAD HEENT: normocephalic, anicteric Thyroid: no enlargement, no palpable nodules Pulmonary: No increased work of breathing, CTAB Cardiovascular: RRR, distal pulses 2+ Breast: Breast symmetrical, no tenderness, no palpable nodules or masses, no skin or nipple retraction present, no nipple discharge.  No axillary or supraclavicular lymphadenopathy. Abdomen: NABS, soft, non-tender, non-distended.  Umbilicus without lesions.  No hepatomegaly, splenomegaly or masses palpable. No evidence of hernia  Genitourinary:  External: Normal external female genitalia.  Normal urethral meatus, normal Bartholin's and Skene's glands.  Small inclusion cyst on left labia at introitus   Vagina: Normal vaginal mucosa, no evidence of prolapse.    Cervix: Grossly normal in appearance, no bleeding  Uterus: Non-enlarged, mobile, normal contour.  No CMT  Adnexa: ovaries non-enlarged, no adnexal masses  Rectal:  deferred  Lymphatic: no evidence of inguinal lymphadenopathy Extremities: no edema, erythema, or tenderness Neurologic: Grossly intact Psychiatric: mood appropriate, affect full  Female chaperone present for pelvic and breast  portions of the physical exam    Assessment: 43 y.o. Vanessa Franco routine annual exam  Plan: Problem List Items Addressed This Visit   None Visit Diagnoses       Well woman exam (no gynecological exam)    -  Primary   Relevant Orders   HEP, RPR, HIV Panel   Hemoglobin A1c   TSH + free T4   Anti mullerian hormone   Varicella zoster antibody, IgG   Rubella screen   HSV(herpes simplex vrs) 1+2 ab-IgG   CBC w/Diff/Platelet   Comprehensive metabolic panel with GFR     Screening for venereal disease       Relevant Orders   HEP, RPR, HIV Panel   Hemoglobin A1c   HSV(herpes simplex vrs) 1+2 ab-IgG     Hot flashes       Relevant Orders   TSH + free T4     Encounter for preconception consultation       Relevant Orders   Anti mullerian hormone   Varicella zoster antibody, IgG   Rubella screen       1) Mammogram - recommend yearly screening mammogram.  Mammogram previously ordered    2) STI screening  wasoffered and accepted Desires test for HSV  3) ASCCP guidelines and rational discussed.  Patient opts for every 3 years screening interval  4) Contraception - the patient is currently using  tubal ligation.  She is interested in a reversal, reviewed OP report, it looks like her tubes were burned, often a tubal reversal is not successful, with your age being over 39 achieving pregnancy may not be possible and comes with complications. Will refer to RE[facility]   5) Colonoscopy -- Screening recommended starting at age 65 for average risk individuals, age 70 for individuals deemed at increased risk (including African Americans) and recommended to continue until age 74.  For patient age 80-85 individualized approach is recommended.  Gold standard screening is via  colonoscopy, Cologuard screening is an acceptable alternative for patient unwilling or unable to undergo colonoscopy.  Colorectal cancer screening for average?risk adults: 2018 guideline update from the American Cancer SocietyCA: A Cancer Journal for Clinicians: Feb 17, 2017   6) Routine healthcare maintenance including cholesterol, diabetes screening discussed Declines  7) Return for Dr Starla when able.for removal of cyst   8) encouraged to return if she has  any concerns for perimenopausal symptoms.   Jinnie Cookey, CNM  [facility] 08/11/2024, 2:20 PM

## 2024-08-15 ENCOUNTER — Other Ambulatory Visit: Payer: Self-pay | Admitting: Licensed Practical Nurse

## 2024-08-15 DIAGNOSIS — B9689 Other specified bacterial agents as the cause of diseases classified elsewhere: Secondary | ICD-10-CM

## 2024-08-15 LAB — CERVICOVAGINAL ANCILLARY ONLY
Bacterial Vaginitis (gardnerella): POSITIVE — AB
Candida Glabrata: NEGATIVE
Candida Vaginitis: NEGATIVE
Chlamydia: NEGATIVE
Comment: NEGATIVE
Comment: NEGATIVE
Comment: NEGATIVE
Comment: NEGATIVE
Comment: NEGATIVE
Comment: NORMAL
Neisseria Gonorrhea: NEGATIVE
Trichomonas: NEGATIVE

## 2024-08-15 MED ORDER — METRONIDAZOLE 500 MG PO TABS: 500.0000 mg | ORAL_TABLET | Freq: Two times a day (BID) | ORAL | 0 refills | Status: AC

## 2024-08-15 MED ORDER — METRONIDAZOLE 0.75 % VA GEL: 1.0000 | Freq: Every day | VAGINAL | 1 refills | Status: AC

## 2024-08-15 NOTE — Progress Notes (Signed)
 TC to pt Your swab shows BV, pt desires treatment. She would like an order for the oral and vaginal treatment, she understands she only needs 1 norm of treatment, but would like another course on hand just in case. She understands she needs to abstain form alcohol while on this medication. Also rec starting a probiotic for women's health.  Script sent to pharmacy on file.  Jinnie Cookey, CNM  [facility] 08/15/24  5:39 PM

## 2024-08-18 LAB — CBC WITH DIFFERENTIAL/PLATELET
Basophils Absolute: 0 x10E3/uL (ref 0.0–0.2)
Basos: 0 %
EOS (ABSOLUTE): 0.2 x10E3/uL (ref 0.0–0.4)
Eos: 2 %
Hematocrit: 39.8 % (ref 34.0–46.6)
Hemoglobin: 13.1 g/dL (ref 11.1–15.9)
Immature Grans (Abs): 0 x10E3/uL (ref 0.0–0.1)
Immature Granulocytes: 0 %
Lymphocytes Absolute: 2 x10E3/uL (ref 0.7–3.1)
Lymphs: 25 %
MCH: 30.5 pg (ref 26.6–33.0)
MCHC: 32.9 g/dL (ref 31.5–35.7)
MCV: 93 fL (ref 79–97)
Monocytes Absolute: 0.6 x10E3/uL (ref 0.1–0.9)
Monocytes: 7 %
Neutrophils Absolute: 5.5 x10E3/uL (ref 1.4–7.0)
Neutrophils: 66 %
Platelets: 234 x10E3/uL (ref 150–450)
RBC: 4.3 x10E6/uL (ref 3.77–5.28)
RDW: 11.8 % (ref 11.7–15.4)
WBC: 8.3 x10E3/uL (ref 3.4–10.8)

## 2024-08-18 LAB — COMPREHENSIVE METABOLIC PANEL WITH GFR
ALT: 11 IU/L (ref 0–32)
AST: 13 IU/L (ref 0–40)
Albumin: 4.2 g/dL (ref 3.9–4.9)
Alkaline Phosphatase: 65 IU/L (ref 41–116)
BUN/Creatinine Ratio: 21 (ref 9–23)
BUN: 13 mg/dL (ref 6–24)
Bilirubin Total: 0.6 mg/dL (ref 0.0–1.2)
CO2: 23 mmol/L (ref 20–29)
Calcium: 9.6 mg/dL (ref 8.7–10.2)
Chloride: 102 mmol/L (ref 96–106)
Creatinine, Ser: 0.63 mg/dL (ref 0.57–1.00)
Globulin, Total: 2.2 g/dL (ref 1.5–4.5)
Glucose: 95 mg/dL (ref 70–99)
Potassium: 3.8 mmol/L (ref 3.5–5.2)
Sodium: 141 mmol/L (ref 134–144)
Total Protein: 6.4 g/dL (ref 6.0–8.5)
eGFR: 113 mL/min/1.73 (ref >59)

## 2024-08-18 LAB — VARICELLA ZOSTER ANTIBODY, IGG: Varicella zoster IgG: REACTIVE

## 2024-08-18 LAB — ANTI MULLERIAN HORMONE: ANTI-MULLERIAN HORMONE (AMH): 0.14 ng/mL

## 2024-08-18 LAB — HEP, RPR, HIV PANEL
HIV Screen 4th Generation wRfx: NONREACTIVE
Hepatitis B Surface Ag: NEGATIVE
RPR Ser Ql: NONREACTIVE

## 2024-08-18 LAB — CYTOLOGY - PAP
Comment: NEGATIVE
Diagnosis: NEGATIVE
Diagnosis: REACTIVE
High risk HPV: NEGATIVE

## 2024-08-18 LAB — RUBELLA SCREEN: Rubella Antibodies, IGG: 3.85 {index} (ref >0.99)

## 2024-08-18 LAB — HEMOGLOBIN A1C
Est. average glucose Bld gHb Est-mCnc: 117 mg/dL
Hgb A1c MFr Bld: 5.7 % — ABNORMAL HIGH (ref 4.8–5.6)

## 2024-08-18 LAB — TSH+FREE T4
Free T4: 1.23 ng/dL (ref 0.82–1.77)
TSH: 0.787 u[IU]/mL (ref 0.450–4.500)

## 2024-08-24 ENCOUNTER — Ambulatory Visit: Payer: Self-pay | Admitting: Licensed Practical Nurse

## 2024-08-29 ENCOUNTER — Ambulatory Visit: Admitting: Obstetrics & Gynecology
